# Patient Record
Sex: Male | Born: 1940
Health system: Southern US, Community
[De-identification: ages and names within clinical notes are randomized; demographics above are authoritative.]

## PROBLEM LIST (undated history)

## (undated) DIAGNOSIS — G3184 Mild cognitive impairment, so stated: Principal | ICD-10-CM

## (undated) DIAGNOSIS — G4733 Obstructive sleep apnea (adult) (pediatric): Secondary | ICD-10-CM

## (undated) DIAGNOSIS — H3530 Unspecified macular degeneration: Secondary | ICD-10-CM

## (undated) DIAGNOSIS — G4730 Sleep apnea, unspecified: Secondary | ICD-10-CM

## (undated) DIAGNOSIS — Z9989 Dependence on other enabling machines and devices: Principal | ICD-10-CM

## (undated) DIAGNOSIS — Z974 Presence of external hearing-aid: Secondary | ICD-10-CM

## (undated) DIAGNOSIS — E1110 Type 2 diabetes mellitus with ketoacidosis without coma: Secondary | ICD-10-CM

## (undated) DIAGNOSIS — H269 Unspecified cataract: Secondary | ICD-10-CM

## (undated) DIAGNOSIS — T4145XA Adverse effect of unspecified anesthetic, initial encounter: Secondary | ICD-10-CM

## (undated) DIAGNOSIS — Z9889 Other specified postprocedural states: Secondary | ICD-10-CM

## (undated) DIAGNOSIS — I6529 Occlusion and stenosis of unspecified carotid artery: Secondary | ICD-10-CM

## (undated) DIAGNOSIS — I10 Essential (primary) hypertension: Secondary | ICD-10-CM

## (undated) DIAGNOSIS — T8859XA Other complications of anesthesia, initial encounter: Secondary | ICD-10-CM

## (undated) DIAGNOSIS — E7800 Pure hypercholesterolemia, unspecified: Secondary | ICD-10-CM

## (undated) DIAGNOSIS — Z9641 Presence of insulin pump (external) (internal): Secondary | ICD-10-CM

## (undated) DIAGNOSIS — I351 Nonrheumatic aortic (valve) insufficiency: Secondary | ICD-10-CM

## (undated) DIAGNOSIS — H9190 Unspecified hearing loss, unspecified ear: Secondary | ICD-10-CM

## (undated) DIAGNOSIS — R112 Nausea with vomiting, unspecified: Secondary | ICD-10-CM

## (undated) HISTORY — DX: Mild cognitive impairment, so stated: G31.84

## (undated) HISTORY — DX: Sleep apnea, unspecified: G47.30

## (undated) HISTORY — DX: Occlusion and stenosis of unspecified carotid artery: I65.29

## (undated) HISTORY — DX: Unspecified cataract: H26.9

## (undated) HISTORY — DX: Nonrheumatic aortic (valve) insufficiency: I35.1

## (undated) HISTORY — DX: Unspecified hearing loss, unspecified ear: H91.90

## (undated) HISTORY — PX: SHOULDER ARTHROSCOPY: SHX128

## (undated) HISTORY — DX: Dependence on other enabling machines and devices: Z99.89

## (undated) HISTORY — DX: Obstructive sleep apnea (adult) (pediatric): G47.33

---
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
## 1998-05-02 ENCOUNTER — Inpatient Hospital Stay (HOSPITAL_COMMUNITY): Admission: EM | Admit: 1998-05-02 | Discharge: 1998-05-04 | Payer: Self-pay | Admitting: Internal Medicine

## 1998-05-03 ENCOUNTER — Encounter (HOSPITAL_BASED_OUTPATIENT_CLINIC_OR_DEPARTMENT_OTHER): Payer: Self-pay | Admitting: Internal Medicine

## 1999-07-07 ENCOUNTER — Encounter: Admission: RE | Admit: 1999-07-07 | Discharge: 1999-10-05 | Payer: Self-pay | Admitting: Internal Medicine

## 2001-09-08 ENCOUNTER — Encounter: Payer: Self-pay | Admitting: Emergency Medicine

## 2001-09-08 ENCOUNTER — Emergency Department (HOSPITAL_COMMUNITY): Admission: EM | Admit: 2001-09-08 | Discharge: 2001-09-08 | Payer: Self-pay | Admitting: Emergency Medicine

## 2002-04-18 ENCOUNTER — Emergency Department (HOSPITAL_COMMUNITY): Admission: EM | Admit: 2002-04-18 | Discharge: 2002-04-18 | Payer: Self-pay

## 2004-05-12 ENCOUNTER — Ambulatory Visit (HOSPITAL_COMMUNITY): Admission: RE | Admit: 2004-05-12 | Discharge: 2004-05-12 | Payer: Self-pay | Admitting: Orthopedic Surgery

## 2004-05-12 ENCOUNTER — Ambulatory Visit (HOSPITAL_BASED_OUTPATIENT_CLINIC_OR_DEPARTMENT_OTHER): Admission: RE | Admit: 2004-05-12 | Discharge: 2004-05-12 | Payer: Self-pay | Admitting: Orthopedic Surgery

## 2007-06-29 ENCOUNTER — Ambulatory Visit: Payer: Self-pay | Admitting: Internal Medicine

## 2007-08-09 ENCOUNTER — Encounter: Payer: Self-pay | Admitting: Internal Medicine

## 2007-08-09 ENCOUNTER — Ambulatory Visit: Payer: Self-pay | Admitting: Internal Medicine

## 2010-11-04 NOTE — Assessment & Plan Note (Signed)
[facility]                         GASTROENTEROLOGY OFFICE NOTE   NAME:Derrick [name]                 MRN:          [id number]  DATE:06/29/2007                            DOB:          [birth year]    REFERRING PHYSICIAN:  Geoffry Paradise, M.D.   REASON FOR CONSULTATION:  Surveillance colonoscopy.   HISTORY:  This is a pleasant, 70 year old white male, retired Arboriculturist, who presents today regarding surveillance  colonoscopy.  He has a history of hypertension, insulin-requiring  diabetes, for which he wears an insulin pump, and dyslipidemia.  He  initially underwent screening colonoscopy in October of 2003.  At that  time, he was found to have left-sided diverticulosis, as well as a  diminutive colon polyp.  This was destroyed with cautery, thus no  meaningful tissue available for submission.  Followup in five years  recommended.   For the most part, the patient has done well.  He has had right-shoulder  surgery in the interim.  No other medical problems.  From a GI  standpoint, also has been doing well.  He does, however, mention that he  has had a change in his bowel habits, which concerns him a little.  Specifically, he reports the entirety of his life having one regular  bowel movement first thing in the morning.  Now he has minimal to no  bowel movement in the morning, though will have a more regular bowel  movement in the afternoon, after lunch.  There is no bleeding, change in  stool caliber, or weight-loss.   PAST MEDICAL HISTORY:  As above.   PAST SURGICAL HISTORY:  Arthroscopic right shoulder surgery.   ALLERGIES:  No known drug allergies.   CURRENT MEDICATIONS:  1. Lisinopril/HCTZ 20/12.5 mg b.i.d.  2. Labetalol 200 mg b.i.d.  3. Aspirin 81 mg daily.  4. Multivitamin.  5. NovoLog insulin pump.  6. Crestor 10 mg daily.  7. Amlodipine 5 mg daily.   FAMILY HISTORY:  Negative for gastrointestinal  malignancy.  Father with  prostate cancer and heart disease.   SOCIAL HISTORY:  Patient is married with two children, lives with his  wife, has a Manufacturing engineer.  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  Does not smoke or use alcohol.   REVIEW OF SYSTEMS:  Per diagnostic evaluation form.   PHYSICAL EXAM:  Well-appearing male, in no acute distress.  Blood pressure 116/66, heart rate 72, weight is 202.6 pounds, he is 5  feet 8 inches in height.  HEENT:  Sclerae anicteric, conjunctivae pink, oral mucosa is intact, no  adenopathy.  LUNGS:  Clear.  HEART:  Regular.  ABDOMEN:  Soft, without tenderness, mass or hernia.  Insulin pump is in  place.  EXTREMITIES:  Without edema.   IMPRESSION:  1. Mild, nonworrisome change in bowel habits.  Recommend fiber      supplementation and reassurance.  2. History of diminutive colon polyp.  Due for surveillance      colonoscopy.  3. Multiple medical problems, including insulin-requiring diabetes.   RECOMMENDATIONS:  1. Fiber supplementation, as stated above.  2.  Schedule colonoscopy with polypectomy, if necessary.  The nature of      the procedure, as well as the risks, benefits, and alternatives,      have been reviewed.  He understood and agreed to proceed.  3. Hold insulin pump in the evening at bedtime prior to his early      morning colonoscopy to avoid hypoglycemia.  4. Ongoing general medical care with Dr. Jacky Kindle.     Wilhemina Bonito. Marina Goodell, MD  Electronically Signed    JNP/MedQ  DD: 06/29/2007  DT: 06/29/2007  Job #: [id number]

## 2010-11-07 NOTE — Op Note (Signed)
Derrick [name], Derrick [name]          ACCOUNT NO.:  [id number]   MEDICAL RECORD NO.:  [id number]          PATIENT TYPE:  AMB   LOCATION:  DSC                          FACILITY:  [facility]   PHYSICIAN:  Robert A. Thurston Hole, M.D. DATE OF BIRTH:  1941-04-10   DATE OF PROCEDURE:  05/12/2004  DATE OF DISCHARGE:                                 OPERATIVE REPORT   PREOPERATIVE DIAGNOSIS:  Right shoulder rotator cuff tendonitis with  adhesive capsulitis and impingement.   POSTOPERATIVE DIAGNOSIS:  1.  Right shoulder partial rotator cuff tear.  2.  Right shoulder adhesive capsulitis.  3.  Right shoulder impingement.   PROCEDURE:  1.  Right shoulder examination under anesthesia followed by manipulation.  2.  Right shoulder arthroscopic lysis of adhesions.  3.  Right shoulder partial rotator cuff tear debridement.  4.  Right shoulder subacromial decompression.   SURGEON:  Elana Alm. Thurston Hole, M.D.   ASSISTANT:  Julien Girt, P.A.   ANESTHESIA:  General.   OPERATIVE TIME:  45 minutes.   COMPLICATIONS:  None.   INDICATIONS FOR PROCEDURE:  Derrick [name] is a 70 year old diabetic who has  been having significant right shoulder pain for the past year increasing in  nature.  Exam and MRI has revealed rotator cuff tendonitis and impingement  with adhesive capsulitis and this has failed conservative care and he is now  to undergo arthroscopy.   DESCRIPTION OF PROCEDURE:  Derrick [name] was brought to the operating room on  May 12, 2004, after an interscalene block had been placed in the  holding room by anesthesia.  He was placed on the operating table in supine  position.  After being placed under general anesthesia, his right shoulder  was examined under anesthesia.  He had range of motion of forward flexion  150, adduction 140,  internal and external rotation of 50 degrees.  A gentle  manipulation was carried out breaking up adhesions and improving forward  flexion to 175, adduction to 170,  internal and external rotation of 85  degrees.  The shoulder remained stable to ligamentous exam.  He received  Ancef 1 gram IV preoperatively for prophylaxis.  He was then placed in a  beach chair position and his shoulder and arm were prepped using sterile  DuraPrep and draped using sterile technique.  Initially, the arthroscopy was  performed through a posterior arthroscopic portal, the arthroscope with a  pump attached was placed and through an anterior portal, an arthroscopic  probe was placed.  On initial inspection, hemarthrosis was noted secondary  to his manipulation and this was evacuated.  The articular cartilage in the  glenohumeral joint was found to be intact.  The anterior labrum, superior  labrum, and posterior labrum were all intact.  The anterior inferior  glenohumeral ligament complex was intact.  The synovium was injected and  hemorrhagic and erythematous throughout consistent with his adhesive  capsulitis and this was partially debrided and cauterized.  The rotator cuff  showed a partial tear of the supraspinatus, 25%, which was debrided.  The  rest of the rotator cuff was found to be intact.  The inferior capsular  recess showed erythematous and hemorrhagic synovitis which was debrided and  cauterized, otherwise, it was free of pathology.  The subacromial space was  entered and a lateral arthroscopic portal was made.  Moderately thickened  bursitis was resected.  The rotator cuff was inflamed and thickened on the  bursal side and this was partially debrided but a significant tear was not  found.  Impingement was noted and a subacromial decompression was carried  out removing 6 mm of the under surface of the anterior, anterior lateral,  and anterior medial acromion and CA ligament release carried out, as well.  The [employer] joint was not disturbed.  The shoulder could be brought through a  full range of motion with no impingement of the rotator cuff after this was  done.  At  this point, it was felt that all the pathology had been  satisfactorily addressed.  The instruments were removed.  The portals were  closed with 3-0 nylon sutures.  Sterile dressings and a sling were applied.  The patient was awakened and taken to the recovery room in stable condition.   FOLLOW UP CARE:  Derrick [name] will be followed as an outpatient with early  aggressive physical therapy.  He will be discharged on Percocet and  Naprosyn.  See him back in the office in a week for sutures out and follow  up.       RAW/MEDQ  D:  05/12/2004  T:  05/12/2004  Job:  [id number]

## 2011-06-30 DIAGNOSIS — H353290 Exudative age-related macular degeneration, unspecified eye, stage unspecified: Secondary | ICD-10-CM | POA: Diagnosis not present

## 2011-07-07 DIAGNOSIS — I10 Essential (primary) hypertension: Secondary | ICD-10-CM | POA: Diagnosis not present

## 2011-07-07 DIAGNOSIS — Z125 Encounter for screening for malignant neoplasm of prostate: Secondary | ICD-10-CM | POA: Diagnosis not present

## 2011-07-07 DIAGNOSIS — E119 Type 2 diabetes mellitus without complications: Secondary | ICD-10-CM | POA: Diagnosis not present

## 2011-07-07 DIAGNOSIS — E785 Hyperlipidemia, unspecified: Secondary | ICD-10-CM | POA: Diagnosis not present

## 2011-07-14 DIAGNOSIS — I10 Essential (primary) hypertension: Secondary | ICD-10-CM | POA: Diagnosis not present

## 2011-07-14 DIAGNOSIS — E119 Type 2 diabetes mellitus without complications: Secondary | ICD-10-CM | POA: Diagnosis not present

## 2011-07-14 DIAGNOSIS — E785 Hyperlipidemia, unspecified: Secondary | ICD-10-CM | POA: Diagnosis not present

## 2011-07-14 DIAGNOSIS — Z125 Encounter for screening for malignant neoplasm of prostate: Secondary | ICD-10-CM | POA: Diagnosis not present

## 2011-07-14 DIAGNOSIS — Z0000 Encounter for general adult medical examination without abnormal findings: Secondary | ICD-10-CM | POA: Diagnosis not present

## 2011-07-16 DIAGNOSIS — S43429A Sprain of unspecified rotator cuff capsule, initial encounter: Secondary | ICD-10-CM | POA: Diagnosis not present

## 2011-07-28 DIAGNOSIS — H353290 Exudative age-related macular degeneration, unspecified eye, stage unspecified: Secondary | ICD-10-CM | POA: Diagnosis not present

## 2011-07-28 DIAGNOSIS — H353190 Nonexudative age-related macular degeneration, unspecified eye, stage unspecified: Secondary | ICD-10-CM | POA: Diagnosis not present

## 2011-08-04 DIAGNOSIS — H353190 Nonexudative age-related macular degeneration, unspecified eye, stage unspecified: Secondary | ICD-10-CM | POA: Diagnosis not present

## 2011-08-04 DIAGNOSIS — H353290 Exudative age-related macular degeneration, unspecified eye, stage unspecified: Secondary | ICD-10-CM | POA: Diagnosis not present

## 2011-08-24 DIAGNOSIS — M75100 Unspecified rotator cuff tear or rupture of unspecified shoulder, not specified as traumatic: Secondary | ICD-10-CM | POA: Diagnosis not present

## 2011-08-24 DIAGNOSIS — M7500 Adhesive capsulitis of unspecified shoulder: Secondary | ICD-10-CM | POA: Diagnosis not present

## 2011-08-24 DIAGNOSIS — M67919 Unspecified disorder of synovium and tendon, unspecified shoulder: Secondary | ICD-10-CM | POA: Diagnosis not present

## 2011-08-24 DIAGNOSIS — M19019 Primary osteoarthritis, unspecified shoulder: Secondary | ICD-10-CM | POA: Diagnosis not present

## 2011-08-24 DIAGNOSIS — M25819 Other specified joint disorders, unspecified shoulder: Secondary | ICD-10-CM | POA: Diagnosis not present

## 2011-08-24 DIAGNOSIS — S43439A Superior glenoid labrum lesion of unspecified shoulder, initial encounter: Secondary | ICD-10-CM | POA: Diagnosis not present

## 2011-08-24 DIAGNOSIS — M719 Bursopathy, unspecified: Secondary | ICD-10-CM | POA: Diagnosis not present

## 2011-08-24 HISTORY — PX: SHOULDER ARTHROSCOPY: SHX128

## 2011-08-25 ENCOUNTER — Other Ambulatory Visit: Payer: Self-pay

## 2011-08-25 ENCOUNTER — Inpatient Hospital Stay (HOSPITAL_COMMUNITY)
Admission: EM | Admit: 2011-08-25 | Discharge: 2011-08-28 | DRG: 639 | Disposition: A | Discharge status: Home / Self Care | Payer: Medicare Other | Attending: Internal Medicine | Admitting: Internal Medicine

## 2011-08-25 ENCOUNTER — Encounter (HOSPITAL_COMMUNITY): Payer: Self-pay | Admitting: *Deleted

## 2011-08-25 DIAGNOSIS — I10 Essential (primary) hypertension: Secondary | ICD-10-CM | POA: Diagnosis not present

## 2011-08-25 DIAGNOSIS — E785 Hyperlipidemia, unspecified: Secondary | ICD-10-CM | POA: Diagnosis not present

## 2011-08-25 DIAGNOSIS — N39498 Other specified urinary incontinence: Secondary | ICD-10-CM | POA: Diagnosis not present

## 2011-08-25 DIAGNOSIS — Z87891 Personal history of nicotine dependence: Secondary | ICD-10-CM

## 2011-08-25 DIAGNOSIS — E1310 Other specified diabetes mellitus with ketoacidosis without coma: Principal | ICD-10-CM | POA: Diagnosis present

## 2011-08-25 DIAGNOSIS — E162 Hypoglycemia, unspecified: Secondary | ICD-10-CM | POA: Diagnosis not present

## 2011-08-25 DIAGNOSIS — Z9889 Other specified postprocedural states: Secondary | ICD-10-CM

## 2011-08-25 DIAGNOSIS — E860 Dehydration: Secondary | ICD-10-CM | POA: Diagnosis not present

## 2011-08-25 DIAGNOSIS — Z7982 Long term (current) use of aspirin: Secondary | ICD-10-CM

## 2011-08-25 DIAGNOSIS — I6350 Cerebral infarction due to unspecified occlusion or stenosis of unspecified cerebral artery: Secondary | ICD-10-CM | POA: Diagnosis not present

## 2011-08-25 DIAGNOSIS — Z9641 Presence of insulin pump (external) (internal): Secondary | ICD-10-CM

## 2011-08-25 DIAGNOSIS — R112 Nausea with vomiting, unspecified: Secondary | ICD-10-CM | POA: Diagnosis not present

## 2011-08-25 DIAGNOSIS — R4182 Altered mental status, unspecified: Secondary | ICD-10-CM | POA: Diagnosis not present

## 2011-08-25 DIAGNOSIS — E1110 Type 2 diabetes mellitus with ketoacidosis without coma: Secondary | ICD-10-CM

## 2011-08-25 DIAGNOSIS — I4891 Unspecified atrial fibrillation: Secondary | ICD-10-CM | POA: Diagnosis not present

## 2011-08-25 DIAGNOSIS — Z794 Long term (current) use of insulin: Secondary | ICD-10-CM | POA: Diagnosis not present

## 2011-08-25 HISTORY — DX: Pure hypercholesterolemia, unspecified: E78.00

## 2011-08-25 HISTORY — DX: Other complications of anesthesia, initial encounter: T88.59XA

## 2011-08-25 HISTORY — DX: Essential (primary) hypertension: I10

## 2011-08-25 HISTORY — DX: Obstructive sleep apnea (adult) (pediatric): G47.33

## 2011-08-25 HISTORY — DX: Adverse effect of unspecified anesthetic, initial encounter: T41.45XA

## 2011-08-25 HISTORY — DX: Unspecified macular degeneration: H35.30

## 2011-08-25 HISTORY — DX: Type 2 diabetes mellitus with ketoacidosis without coma: E11.10

## 2011-08-25 HISTORY — DX: Presence of insulin pump (external) (internal): Z96.41

## 2011-08-25 LAB — GLUCOSE, CAPILLARY
Glucose-Capillary: 579 mg/dL (ref 70–99)
Glucose-Capillary: 321 mg/dL — ABNORMAL HIGH (ref 70–99)

## 2011-08-25 LAB — BASIC METABOLIC PANEL
BUN: 30 mg/dL — ABNORMAL HIGH (ref 6–23)
Calcium: 9.1 mg/dL (ref 8.4–10.5)
GFR calc non Af Amer: 56 mL/min — ABNORMAL LOW (ref >90)
Glucose, Bld: 531 mg/dL — ABNORMAL HIGH (ref 70–99)

## 2011-08-25 LAB — URINE MICROSCOPIC-ADD ON

## 2011-08-25 LAB — URINALYSIS, ROUTINE W REFLEX MICROSCOPIC
Bilirubin Urine: NEGATIVE
Nitrite: NEGATIVE
Specific Gravity, Urine: 1.028 (ref 1.005–1.030)
pH: 5.5 (ref 5.0–8.0)

## 2011-08-25 LAB — DIFFERENTIAL
Basophils Absolute: 0.1 10*3/uL (ref 0.0–0.1)
Lymphocytes Relative: 7 % — ABNORMAL LOW (ref 12–46)
Monocytes Absolute: 1.2 10*3/uL — ABNORMAL HIGH (ref 0.1–1.0)
Monocytes Relative: 8 % (ref 3–12)
Neutro Abs: 12.4 10*3/uL — ABNORMAL HIGH (ref 1.7–7.7)

## 2011-08-25 LAB — POCT I-STAT, CHEM 8
BUN: 31 mg/dL — ABNORMAL HIGH (ref 6–23)
Chloride: 104 mEq/L (ref 96–112)
Creatinine, Ser: 1.3 mg/dL (ref 0.50–1.35)
Glucose, Bld: 565 mg/dL (ref 70–99)
Hemoglobin: 13.9 g/dL (ref 13.0–17.0)
Potassium: 4.7 mEq/L (ref 3.5–5.1)

## 2011-08-25 LAB — CBC
HCT: 39.6 % (ref 39.0–52.0)
Hemoglobin: 13.6 g/dL (ref 13.0–17.0)
RBC: 4.32 MIL/uL (ref 4.22–5.81)
RDW: 12.9 % (ref 11.5–15.5)
WBC: 14.6 10*3/uL — ABNORMAL HIGH (ref 4.0–10.5)

## 2011-08-25 LAB — POCT I-STAT 3, VENOUS BLOOD GAS (G3P V)
Bicarbonate: 15.5 mEq/L — ABNORMAL LOW (ref 20.0–24.0)
O2 Saturation: 99 %
TCO2: 17 mmol/L (ref 0–100)
pCO2, Ven: 36 mmHg — ABNORMAL LOW (ref 45.0–50.0)
pO2, Ven: 158 mmHg — ABNORMAL HIGH (ref 30.0–45.0)

## 2011-08-25 MED ORDER — ONDANSETRON HCL 4 MG/2ML IJ SOLN
4.0000 mg | Freq: Once | INTRAMUSCULAR | Status: AC
  Administered 2011-08-25: 4 mg via INTRAVENOUS

## 2011-08-25 MED ORDER — SODIUM CHLORIDE 0.9 % IV BOLUS (SEPSIS)
1000.0000 mL | Freq: Once | INTRAVENOUS | Status: AC
  Administered 2011-08-25: 1000 mL via INTRAVENOUS

## 2011-08-25 MED ORDER — DEXTROSE-NACL 5-0.45 % IV SOLN: INTRAVENOUS | Status: DC

## 2011-08-25 MED ORDER — POTASSIUM CHLORIDE 10 MEQ/100ML IV SOLN
10.0000 meq | INTRAVENOUS | Status: AC
  Administered 2011-08-25 – 2011-08-26 (×4): 10 meq via INTRAVENOUS
  Filled 2011-08-25 (×4): qty 100

## 2011-08-25 MED ORDER — ONDANSETRON HCL 4 MG/2ML IJ SOLN
INTRAMUSCULAR | Status: AC
  Administered 2011-08-25: 4 mg via INTRAVENOUS
  Filled 2011-08-25: qty 2

## 2011-08-25 MED ORDER — SODIUM CHLORIDE 0.9 % IV SOLN
INTRAVENOUS | Status: DC
  Administered 2011-08-25 – 2011-08-27 (×3): via INTRAVENOUS

## 2011-08-25 MED ORDER — HYDROCODONE-ACETAMINOPHEN 5-325 MG PO TABS
1.0000 | ORAL_TABLET | ORAL | Status: DC | PRN
  Administered 2011-08-27: 2 via ORAL
  Filled 2011-08-25: qty 2

## 2011-08-25 MED ORDER — SODIUM CHLORIDE 0.9 % IV SOLN
INTRAVENOUS | Status: DC
  Administered 2011-08-25: 15 mL/h via INTRAVENOUS

## 2011-08-25 MED ORDER — INSULIN ASPART 100 UNIT/ML ~~LOC~~ SOLN
15.0000 [IU] | Freq: Once | SUBCUTANEOUS | Status: AC
  Administered 2011-08-25: 15 [IU] via INTRAVENOUS
  Filled 2011-08-25: qty 15

## 2011-08-25 MED ORDER — SODIUM CHLORIDE 0.9 % IV SOLN
INTRAVENOUS | Status: DC
  Administered 2011-08-25: 22:00:00 via INTRAVENOUS
  Administered 2011-08-25: 4.9 [IU]/h via INTRAVENOUS
  Filled 2011-08-25 (×2): qty 1

## 2011-08-25 MED ORDER — SODIUM CHLORIDE 0.9 % IV SOLN: INTRAVENOUS | Status: DC

## 2011-08-25 MED ORDER — DEXTROSE 50 % IV SOLN: 25.0000 mL | INTRAVENOUS | Status: DC | PRN

## 2011-08-25 MED ORDER — ONDANSETRON HCL 4 MG PO TABS
4.0000 mg | ORAL_TABLET | Freq: Four times a day (QID) | ORAL | Status: DC | PRN
  Filled 2011-08-25: qty 1

## 2011-08-25 MED ORDER — SODIUM CHLORIDE 0.9 % IV SOLN
INTRAVENOUS | Status: AC
  Administered 2011-08-25: 19:00:00 via INTRAVENOUS

## 2011-08-25 MED ORDER — ALUM & MAG HYDROXIDE-SIMETH 200-200-20 MG/5ML PO SUSP: 30.0000 mL | Freq: Four times a day (QID) | ORAL | Status: DC | PRN

## 2011-08-25 MED ORDER — ONDANSETRON HCL 4 MG/2ML IJ SOLN: 4.0000 mg | Freq: Four times a day (QID) | INTRAMUSCULAR | Status: DC | PRN

## 2011-08-25 NOTE — Progress Notes (Signed)
Pt arrived to 4740 via stretcher from ED,  VSS, Insulin drip infusing at 17.3. Will carry out new orders.  Will cont to monitor.

## 2011-08-25 NOTE — ED Provider Notes (Signed)
I have personally seen and examined the patient.  I have discussed the plan of care with the resident.  I have reviewed the documentation on PMH/FH/Soc. History.  I have reviewed the documentation of the resident and agree.   Date: 08/25/2011  Rate: 102  Rhythm: sinus tachycardia  QRS Axis: left  Intervals: normal  ST/T Wave abnormalities: nonspecific ST changes  Conduction Disutrbances:none   Pt stabilized in the ED, insulin gtt started He is actively vomiting in the ED Dr Jacky Kindle here in ED to admit   Joya Gaskins, MD 08/25/11 [phone]

## 2011-08-25 NOTE — ED Provider Notes (Signed)
History     CSN: [id number]  Arrival date & time 08/25/11  1109   First MD Initiated Contact with Patient 08/25/11 1112      Chief Complaint  Patient presents with  . Hyperglycemia    snatched insulin pump tubing out and not getting insulin and now vomiting  . Shoulder Pain    post rotator cuff repair yesterday    (Consider location/radiation/quality/duration/timing/severity/associated sxs/prior treatment) Patient is a 71 y.o. male presenting with general illness. The history is provided by the patient.  Illness  The current episode started yesterday. The problem occurs rarely. The problem has been unchanged. The problem is moderate. The symptoms are relieved by nothing. The symptoms are aggravated by nothing. Associated symptoms include nausea and vomiting (vomited twice this morning). Pertinent negatives include no fever, no abdominal pain, no congestion, no rhinorrhea, no cough and no URI. Recently, medical care has been given by a specialist (had arthroscopic left shoulder surgery yesterday. ).    Past Medical History  Diagnosis Date  . Diabetes mellitus   . Hypertension   . Hypercholesteremia   . Macular degeneration   . Obstructive sleep apnea     Past Surgical History  Procedure Date  . Rotator cuff surgery yesterday 08/24/11    No family history on file.  History  Substance Use Topics  . Smoking status: Former Games developer  . Smokeless tobacco: Not on file  . Alcohol Use: No      Review of Systems  Constitutional: Negative for fever.  HENT: Negative for congestion and rhinorrhea.   Respiratory: Negative for cough.   Cardiovascular: Negative for chest pain.  Gastrointestinal: Positive for nausea and vomiting (vomited twice this morning). Negative for abdominal pain.  Genitourinary:       Polyuria, dry mouth   All other systems reviewed and are negative.    Allergies  Review of patient's allergies indicates no known allergies.  Home Medications  No  current outpatient prescriptions on file.  BP 132/52  Pulse 103  Temp(Src) 98.5 F (36.9 C) (Oral)  Resp 18  Ht 5\' 8"  (1.727 m)  Wt 191 lb (86.637 kg)  BMI 29.04 kg/m2  SpO2 98%  Physical Exam  Nursing note and vitals reviewed. Constitutional: He is oriented to person, place, and time. He appears well-developed and well-nourished. No distress.  HENT:  Head: Normocephalic and atraumatic.  Right Ear: External ear normal.  Left Ear: External ear normal.  Mouth/Throat: Oropharynx is clear and moist.  Eyes: Pupils are equal, round, and reactive to light.  Neck: Normal range of motion. Neck supple.  Cardiovascular: Regular rhythm and intact distal pulses.  Exam reveals no gallop and no friction rub.   Murmur (II/VI SEM) heard.      Tachycardic.   Pulmonary/Chest: Effort normal and breath sounds normal. No respiratory distress. He has no wheezes. He has no rales.  Abdominal: Soft. There is no tenderness. There is no rebound and no guarding.       Insulin pump noted at right side of abdomen.   Musculoskeletal: Normal range of motion. He exhibits tenderness. He exhibits no edema.       Left shoulder bandage in place. Steri strips noted with dried blood. No drainage, erythema or induration noted. TTP.   Lymphadenopathy:    He has no cervical adenopathy.  Neurological: He is alert and oriented to person, place, and time.  Skin: Skin is warm and dry. No rash noted. No erythema.  Psychiatric: He has a normal mood  and affect. His behavior is normal.    ED Course  Procedures (including critical care time)  Results for orders placed during the hospital encounter of 08/25/11  CBC      Component Value Range   WBC 14.6 (*) 4.0 - 10.5 (K/uL)   RBC 4.32  4.22 - 5.81 (MIL/uL)   Hemoglobin 13.6  13.0 - 17.0 (g/dL)   HCT 84.1  32.4 - 40.1 (%)   MCV 91.7  78.0 - 100.0 (fL)   MCH 31.5  26.0 - 34.0 (pg)   MCHC 34.3  30.0 - 36.0 (g/dL)   RDW 02.7  25.3 - 66.4 (%)   Platelets 191  150 - 400  (K/uL)  DIFFERENTIAL      Component Value Range   Neutrophils Relative 85 (*) 43 - 77 (%)   Neutro Abs 12.4 (*) 1.7 - 7.7 (K/uL)   Lymphocytes Relative 7 (*) 12 - 46 (%)   Lymphs Abs 1.0  0.7 - 4.0 (K/uL)   Monocytes Relative 8  3 - 12 (%)   Monocytes Absolute 1.2 (*) 0.1 - 1.0 (K/uL)   Eosinophils Relative 0  0 - 5 (%)   Eosinophils Absolute 0.0  0.0 - 0.7 (K/uL)   Basophils Relative 0  0 - 1 (%)   Basophils Absolute 0.1  0.0 - 0.1 (K/uL)  URINALYSIS, ROUTINE W REFLEX MICROSCOPIC      Component Value Range   Color, Urine YELLOW  YELLOW    APPearance CLEAR  CLEAR    Specific Gravity, Urine 1.028  1.005 - 1.030    pH 5.5  5.0 - 8.0    Glucose, UA >1000 (*) NEGATIVE (mg/dL)   Hgb urine dipstick NEGATIVE  NEGATIVE    Bilirubin Urine NEGATIVE  NEGATIVE    Ketones, ur >80 (*) NEGATIVE (mg/dL)   Protein, ur NEGATIVE  NEGATIVE (mg/dL)   Urobilinogen, UA 0.2  0.0 - 1.0 (mg/dL)   Nitrite NEGATIVE  NEGATIVE    Leukocytes, UA NEGATIVE  NEGATIVE   GLUCOSE, CAPILLARY      Component Value Range   Glucose-Capillary 579 (*) 70 - 99 (mg/dL)   Comment 1 Documented in Chart     Comment 2 Notify RN    URINE MICROSCOPIC-ADD ON      Component Value Range   Squamous Epithelial / LPF RARE  RARE    WBC, UA 0-2  <3 (WBC/hpf)   RBC / HPF 0-2  <3 (RBC/hpf)   Bacteria, UA RARE  RARE    Casts HYALINE CASTS (*) NEGATIVE   POCT I-STAT, CHEM 8      Component Value Range   Sodium 134 (*) 135 - 145 (mEq/L)   Potassium 4.7  3.5 - 5.1 (mEq/L)   Chloride 104  96 - 112 (mEq/L)   BUN 31 (*) 6 - 23 (mg/dL)   Creatinine, Ser 4.03  0.50 - 1.35 (mg/dL)   Glucose, Bld 474 (*) 70 - 99 (mg/dL)   Calcium, Ion 2.59 (*) 1.12 - 1.32 (mmol/L)   TCO2 18  0 - 100 (mmol/L)   Hemoglobin 13.9  13.0 - 17.0 (g/dL)   HCT 56.3  87.5 - 64.3 (%)   Comment NOTIFIED PHYSICIAN    POCT I-STAT 3, BLOOD GAS (G3P V)      Component Value Range   pH, Ven 7.243 (*) 7.250 - 7.300    pCO2, Ven 36.0 (*) 45.0 - 50.0 (mmHg)   pO2, Ven  158.0 (*) 30.0 - 45.0 (mmHg)   Bicarbonate  15.5 (*) 20.0 - 24.0 (mEq/L)   TCO2 17  0 - 100 (mmol/L)   O2 Saturation 99.0     Acid-base deficit 11.0 (*) 0.0 - 2.0 (mmol/L)   Sample type VENOUS    BASIC METABOLIC PANEL      Component Value Range   Sodium 134 (*) 135 - 145 (mEq/L)   Potassium 4.8  3.5 - 5.1 (mEq/L)   Chloride 92 (*) 96 - 112 (mEq/L)   CO2 14 (*) 19 - 32 (mEq/L)   Glucose, Bld 531 (*) 70 - 99 (mg/dL)   BUN 30 (*) 6 - 23 (mg/dL)   Creatinine, Ser 0.45  0.50 - 1.35 (mg/dL)   Calcium 9.1  8.4 - 40.9 (mg/dL)   GFR calc non Af Amer 56 (*) >90 (mL/min)   GFR calc Af Amer 65 (*) >90 (mL/min)  GLUCOSE, CAPILLARY      Component Value Range   Glucose-Capillary 522 (*) 70 - 99 (mg/dL)   Comment 1 Documented in Chart     Comment 2 Notify RN    GLUCOSE, CAPILLARY      Component Value Range   Glucose-Capillary 579 (*) 70 - 99 (mg/dL)   Comment 1 Documented in Chart     Comment 2 Notify RN         1. DKA (diabetic ketoacidoses)   2. Dehydration       MDM  16:65 AM 71 year old male with history of type 2 diabetes on insulin pump, hypertension and hyperlipidemia presenting with hyperglycemia that he states started yesterday. He noticed his sugars were in the 500s-600 range. He states this morning his insulin pump tubing acidentally became disconnected. He endorses dry mouth and polyuria since yesterday. He did have arthroscopic left shoulder surgery yesterday. He endorses 2 episodes of vomiting this morning, nonbloody and nonbilious. Patient is mildly tachycardic with a heart rate in the low 100s otherwise appears well. We'll check CBC, BMP, CBG, blood gas and fluid hydration.  Pt noted to have some vomiting. Labs show elevated gap metabolic acidosis c/w DKA. Dr. Jacky Kindle contact who has evaluated the patient in the ED. Placed on insulin drip and pt admitted to Dr. Jacky Kindle for further management.          Sheran Luz, MD 08/25/11 [phone]

## 2011-08-25 NOTE — ED Notes (Signed)
Pt had left shoulder rotator cuff repair yesterday and today accidentally snatch insulin pump tubing causing inability to get insulin.  Now sugar is critical high per ems machine, pt is vomiting due to uncontrolled pain.

## 2011-08-25 NOTE — H&P (Signed)
PCP:   Minda Meo, MD, MD   Chief Complaint:  Nausea and vomiting, altered mental status  HPI: Derrick [name] is a patient well-known to me, 71 year old, diabetes mellitus type 2 insulin-dependent on a pump was one-day status post left shoulder surgery. Leading up to this surgery we had given very specific pump instructions to the extent that he back is basals down and was supposed to given supplemental and swollen post op. Evidently emerge S. today after surgery with blood sugars in the 300s but has not had any supplemental insulin and sometime during the night his pump became disconnected from the insertion site. His wife contacted my nurse stating that he was unresponsive or delirious with blood sugars and axis of 500. EMS was summoned and he was brought to the emergency room where he was found to be vomiting, blood sugar in excess of 500, venous pH of 7.2 and smelling ketotic. At the time of my assessment he is awake alert a bit confused regarding circumstances but in good spirits. He has yet to receive any insulin at this time his blood sugar remains elevated. He is being aggressively hydrated. He's had no further vomiting since he received an injection of anti-medic. Pain involving the left shoulder is well-controlled and remains unclear however whether the Percocet treated more the nausea and vomiting whether this was more related to his metabolic derangements. Regardless, he is admitted at this time with either DKA or a nonketotic hyperosmolar state as lab work is still pending. He will be given IV fluids, IV insulin and further pending his course.  Review of Systems:  The patient denies anorexia, fever, weight loss,, vision loss, decreased hearing, hoarseness, chest pain, syncope, dyspnea on exertion, peripheral edema, balance deficits, hemoptysis, abdominal pain, melena, hematochezia, severe indigestion/heartburn, hematuria, incontinence, genital sores, muscle weakness, suspicious skin lesions,  transient blindness, difficulty walking, depression, unusual weight change, abnormal bleeding, enlarged lymph nodes, angioedema, and breast masses.  Past Medical History: Past Medical History  Diagnosis Date  . Diabetes mellitus   . Hypertension   . Hypercholesteremia   . Macular degeneration   . Obstructive sleep apnea    Past Surgical History  Procedure Date  . Rotator cuff surgery yesterday 08/24/11    Medications: Prior to Admission medications   Not on File    Allergies:  No Known Allergies  Social History:  reports that he has quit smoking. He does not have any smokeless tobacco history on file. He reports that he does not drink alcohol or use illicit drugs.  Family History: No family history on file.  Physical Exam: Filed Vitals:   08/25/11 1111 08/25/11 1308  BP: 132/52 128/58  Pulse: 103 101  Temp: 98.5 F (36.9 C)   TempSrc: Oral   Resp: 18 18  Height: 5\' 8"  (1.727 m)   Weight: 86.637 kg (191 lb)   SpO2: 98% 96%   General appearance: alert and cooperative Head: Normocephalic, without obvious abnormality, atraumatic Eyes: conjunctivae/corneas clear. PERRL, EOM's intact.  Nose: Nares normal. Septum midline. Mucosa normal. No drainage or sinus tenderness. Throat: lips, mucosa, and tongue normal; teeth and gums normal Neck: no adenopathy, no carotid bruit, no JVD and thyroid not enlarged, symmetric, no tenderness/mass/nodules Resp: clear to auscultation bilaterally Cardio: regular rate and rhythm, S1, S2 normal, no murmur, click, rub or gallop GI: soft, non-tender; bowel sounds normal; no masses,  no organomegaly Extremities: extremities normal, atraumatic, no cyanosis or edema Pulses: 2+ and symmetric Lymph nodes: Cervical adenopathy: no cervical lymphadenopathy Neurologic:  Alert and oriented X 3, normal strength and tone. Normal symmetric reflexes.     Labs on Admission:   [facility] 08/25/11 1223  NA 134*  K 4.7  CL 104  CO2 --  GLUCOSE 565*    BUN 31*  CREATININE 1.30  CALCIUM --  MG --  PHOS --   No results found for this basename: AST:2,ALT:2,ALKPHOS:2,BILITOT:2,PROT:2,ALBUMIN:2 in the last 72 hours No results found for this basename: LIPASE:2,AMYLASE:2 in the last 72 hours  Basename 08/25/11 1223 08/25/11 1204  WBC -- 14.6*  NEUTROABS -- 12.4*  HGB 13.9 13.6  HCT 41.0 39.6  MCV -- 91.7  PLT -- 191   No results found for this basename: CKTOTAL:3,CKMB:3,CKMBINDEX:3,TROPONINI:3 in the last 72 hours No results found for this basename: TSH,T4TOTAL,FREET3,T3FREE,THYROIDAB in the last 72 hours No results found for this basename: VITAMINB12:2,FOLATE:2,FERRITIN:2,TIBC:2,IRON:2,RETICCTPCT:2 in the last 72 hours  Radiological Exams on Admission: No results found. Orders placed during the hospital encounter of 08/25/11  . ED EKG  . ED EKG    Assessment/Plan  #1 DKA versus nonketotic hyperosmolar state lab work still pending interventions will remain the same to include IV fluids and IV insulin  #2 essential hypertension stable  #3 hyperlipidemia stable  #4 postop day #1 left shoulder arthroscopy/rotator cuff repair by Dr. Alda Berthold  Marine Lezotte A 08/25/2011, 1:15 PM

## 2011-08-25 NOTE — ED Provider Notes (Signed)
Pt seen with resident He is here for intractable vomiting, likely DKA Seen by dr Jacky Kindle and will admit   Joya Gaskins, MD 08/25/11 [phone]

## 2011-08-25 NOTE — ED Notes (Signed)
Pt vomited, soiling bed and gown.  Pt cleaned, was able to sit in chair with bed cleaned and linens changed.  Top gauze changed on L shoulder dressing due to being soiled as well, pt tolerated well.  Bolus continues, pt is alert and oriented, conversing with family at bedside and in no acute distress.

## 2011-08-26 LAB — CBC
HCT: 36.2 % — ABNORMAL LOW (ref 39.0–52.0)
MCHC: 35.4 g/dL (ref 30.0–36.0)
MCV: 88.1 fL (ref 78.0–100.0)
RDW: 13 % (ref 11.5–15.5)

## 2011-08-26 LAB — BASIC METABOLIC PANEL
BUN: 28 mg/dL — ABNORMAL HIGH (ref 6–23)
CO2: 21 mEq/L (ref 19–32)
Chloride: 109 mEq/L (ref 96–112)
Creatinine, Ser: 1.08 mg/dL (ref 0.50–1.35)
Glucose, Bld: 62 mg/dL — ABNORMAL LOW (ref 70–99)

## 2011-08-26 LAB — GLUCOSE, CAPILLARY
Glucose-Capillary: 214 mg/dL — ABNORMAL HIGH (ref 70–99)
Glucose-Capillary: 59 mg/dL — ABNORMAL LOW (ref 70–99)
Glucose-Capillary: 121 mg/dL — ABNORMAL HIGH (ref 70–99)
Glucose-Capillary: 202 mg/dL — ABNORMAL HIGH (ref 70–99)
Glucose-Capillary: 271 mg/dL — ABNORMAL HIGH (ref 70–99)

## 2011-08-26 MED ORDER — INSULIN ASPART 100 UNIT/ML ~~LOC~~ SOLN
0.0000 [IU] | Freq: Three times a day (TID) | SUBCUTANEOUS | Status: DC
  Administered 2011-08-26: 5 [IU] via SUBCUTANEOUS
  Administered 2011-08-26: 8 [IU] via SUBCUTANEOUS
  Administered 2011-08-27 (×2): 5 [IU] via SUBCUTANEOUS
  Administered 2011-08-27: 8 [IU] via SUBCUTANEOUS
  Administered 2011-08-28: 11 [IU] via SUBCUTANEOUS
  Administered 2011-08-28: 5 [IU] via SUBCUTANEOUS
  Filled 2011-08-26 (×3): qty 3

## 2011-08-26 MED ORDER — INSULIN GLARGINE 100 UNIT/ML ~~LOC~~ SOLN
15.0000 [IU] | Freq: Every day | SUBCUTANEOUS | Status: DC
  Administered 2011-08-26: 15 [IU] via SUBCUTANEOUS
  Filled 2011-08-26: qty 3

## 2011-08-26 MED ORDER — AMLODIPINE BESYLATE 5 MG PO TABS
5.0000 mg | ORAL_TABLET | Freq: Every day | ORAL | Status: DC
  Administered 2011-08-26 – 2011-08-28 (×3): 5 mg via ORAL
  Filled 2011-08-26 (×3): qty 1

## 2011-08-26 MED ORDER — LISINOPRIL 20 MG PO TABS
20.0000 mg | ORAL_TABLET | Freq: Two times a day (BID) | ORAL | Status: DC
  Administered 2011-08-26 – 2011-08-28 (×5): 20 mg via ORAL
  Filled 2011-08-26 (×6): qty 1

## 2011-08-26 NOTE — Progress Notes (Signed)
Pt converted from Afib to Sinus Rhythm about 0100.

## 2011-08-26 NOTE — Progress Notes (Signed)
Subjective: Derrick [name] is awake and alert looks much better. Data one episode of vomiting early morning and blood sugars have stabilized nicely. Serum CO2 has normalized as well. Pain remains involving the left shoulder. He certainly not ready transition back to the pump by his regards in terms of his ability to manage the pump.  Objective: Vital signs in last 24 hours: Temp:  [97.8 F (36.6 C)-98.6 F (37 C)] 97.8 F (36.6 C) (03/06 0603) Pulse Rate:  [90-103] 95  (03/06 0603) Resp:  [16-20] 20  (03/06 0603) BP: (96-151)/(47-80) 151/59 mmHg (03/06 0603) SpO2:  [94 %-98 %] 95 % (03/06 0603) Weight:  [86.637 kg (191 lb)-88.5 kg (195 lb 1.7 oz)] 88.5 kg (195 lb 1.7 oz) (03/06 0603) Weight change:   CBG (last 3)   Basename 08/26/11 0616 08/26/11 0507 08/26/11 0356  GLUCAP 121* 59* 86    Intake/Output from previous day: 03/05 0701 - 03/06 0700 In: -  Out: 751 [Urine:600; Emesis/NG output:150; Stool:1]  Physical Exam: Patient is awake alert bright interactive no distress. Good facial symmetry. No JVD or bruits. Lungs are clear. Cardiovascular exam regular rate and rhythm no murmur. Abdomen is soft and nontender bowel sounds normal. Extremities intact distal pulses no edema. Left shoulder is dressed. Neurologically he is nonlateralizing.   Lab Results:  [facility] 08/26/11 0512 08/25/11 1233  NA 139 134*  K 3.7 4.8  CL 109 92*  CO2 21 14*  GLUCOSE 62* 531*  BUN 28* 30*  CREATININE 1.08 1.28  CALCIUM 8.4 9.1  MG -- --  PHOS -- --   No results found for this basename: AST:2,ALT:2,ALKPHOS:2,BILITOT:2,PROT:2,ALBUMIN:2 in the last 72 hours  Basename 08/26/11 0512 08/25/11 1223 08/25/11 1204  WBC 16.1* -- 14.6*  NEUTROABS -- -- 12.4*  HGB 12.8* 13.9 --  HCT 36.2* 41.0 --  MCV 88.1 -- 91.7  PLT 222 -- 191   No results found for this basename: INR, PROTIME   No results found for this basename: CKTOTAL:3,CKMB:3,CKMBINDEX:3,TROPONINI:3 in the last 72 hours No results found for this  basename: TSH,T4TOTAL,FREET3,T3FREE,THYROIDAB in the last 72 hours No results found for this basename: VITAMINB12:2,FOLATE:2,FERRITIN:2,TIBC:2,IRON:2,RETICCTPCT:2 in the last 72 hours  Studies/Results: No results found.   Assessment/Plan: #1 diabetic ketoacidosis resolved will transition back to Lantus and sliding scale insulin  #2 essential hypertension stable  #3 hyperlipidemia stable  #4 postop day #2 left rotator cuff [place] or so notified of admission yesterday   LOS: 1 day   Shalaya Swailes A 08/26/2011, 7:27 AM

## 2011-08-27 ENCOUNTER — Inpatient Hospital Stay (HOSPITAL_COMMUNITY): Payer: Medicare Other

## 2011-08-27 LAB — BASIC METABOLIC PANEL
CO2: 16 mEq/L — ABNORMAL LOW (ref 19–32)
Calcium: 8.2 mg/dL — ABNORMAL LOW (ref 8.4–10.5)
Creatinine, Ser: 0.9 mg/dL (ref 0.50–1.35)
GFR calc non Af Amer: 85 mL/min — ABNORMAL LOW (ref >90)

## 2011-08-27 LAB — BLOOD GAS, ARTERIAL
Acid-base deficit: 4.3 mmol/L — ABNORMAL HIGH (ref 0.0–2.0)
Drawn by: 277341
O2 Saturation: 97.9 %
Patient temperature: 98.6
pO2, Arterial: 84.3 mmHg (ref 80.0–100.0)

## 2011-08-27 LAB — GLUCOSE, CAPILLARY
Glucose-Capillary: 257 mg/dL — ABNORMAL HIGH (ref 70–99)
Glucose-Capillary: 313 mg/dL — ABNORMAL HIGH (ref 70–99)
Glucose-Capillary: 320 mg/dL — ABNORMAL HIGH (ref 70–99)
Glucose-Capillary: 278 mg/dL — ABNORMAL HIGH (ref 70–99)
Glucose-Capillary: 235 mg/dL — ABNORMAL HIGH (ref 70–99)

## 2011-08-27 LAB — CBC
MCH: 31.5 pg (ref 26.0–34.0)
MCV: 89.1 fL (ref 78.0–100.0)
Platelets: 186 10*3/uL (ref 150–400)
RDW: 13.4 % (ref 11.5–15.5)
WBC: 10.4 10*3/uL (ref 4.0–10.5)

## 2011-08-27 MED ORDER — INSULIN ASPART 100 UNIT/ML ~~LOC~~ SOLN
10.0000 [IU] | Freq: Once | SUBCUTANEOUS | Status: AC
  Administered 2011-08-27: 10 [IU] via SUBCUTANEOUS

## 2011-08-27 MED ORDER — INSULIN GLARGINE 100 UNIT/ML ~~LOC~~ SOLN
25.0000 [IU] | Freq: Every day | SUBCUTANEOUS | Status: DC
  Administered 2011-08-27 – 2011-08-28 (×2): 25 [IU] via SUBCUTANEOUS

## 2011-08-27 NOTE — Progress Notes (Addendum)
At 0516 CBG 320. Gave 10 units of Novolog insulin as ordered. Rechecked Blood sugar. CBG is now 278. Will continue to monitor

## 2011-08-27 NOTE — Progress Notes (Signed)
Met with pt and wife, pt son also in the room to discuss possible HH needs. HHRN to continue diabetes education and disease management may be only need. Pt confusion clearing per wife and son. Daughter not in room, list of [facility] providers provided and wife with discuss with daughter.  Will follow up with pt and wife tomorrow, 08/28/2011 Johny Shock RN MPH Case Manager [phone]

## 2011-08-27 NOTE — Progress Notes (Signed)
Inpatient Diabetes Program Recommendations  AACE/ADA: New Consensus Statement on Inpatient Glycemic Control (2009)  Target Ranges:  Prepandial:   less than 140 mg/dL      Peak postprandial:   less than 180 mg/dL (1-2 hours)      Critically ill patients:  140 - 180 mg/dL    Inpatient Diabetes Program Recommendations Correction (SSI): consider decreasing to sensitive if meal coverage is added Insulin - Meal Coverage: Consider adding meal coverage per pump settings

## 2011-08-27 NOTE — Progress Notes (Signed)
Pt. Converted from Afib to Sinus Tach at [facility]

## 2011-08-27 NOTE — Progress Notes (Signed)
Subjective: Patient is awake and alert but has had some difficulty with confusion through the night certainly confused regarding some circumstances this morning. He is appropriate and calm and following commands. Rhythm has remained sinus/sinus tach. Blood sugars have been up but it we have underestimated his basal needs and upon discussions with the family where clearly not going to embark upon resuming pump therapy at this point and will be more aggressive with basal Lantus and sliding scale insulin can further sort out his morning chemistries.  Objective: Vital signs in last 24 hours: Temp:  [98.1 F (36.7 C)-99.1 F (37.3 C)] 98.4 F (36.9 C) (03/07 0627) Pulse Rate:  [106-115] 109  (03/07 0627) Resp:  [20] 20  (03/07 0627) BP: (153-172)/(67-82) 172/82 mmHg (03/07 0627) SpO2:  [98 %-99 %] 99 % (03/07 0627) Weight:  [88.6 kg (195 lb 5.2 oz)] 88.6 kg (195 lb 5.2 oz) (03/07 0627) Weight change: 1.963 kg (4 lb 5.2 oz)  CBG (last 3)   Basename 08/27/11 0610 08/27/11 0516 08/27/11 0420  GLUCAP 278* 320* 313*    Intake/Output from previous day: 03/06 0701 - 03/07 0700 In: 2576.3 [P.O.:780; I.V.:1796.3] Out: 3481 [Urine:3480; Stool:1]  Physical Exam: Patient is awake alert conversant and nonlateralizing. Lungs are clear. Cardiovascular exam regular rate and rhythm. No edema and no JVD. Good facial symmetry.   Lab Results:  Basename 08/27/11 0516 08/26/11 0512  NA 139 139  K 3.7 3.7  CL 106 109  CO2 16* 21  GLUCOSE 318* 62*  BUN 15 28*  CREATININE 0.90 1.08  CALCIUM 8.2* 8.4  MG -- --  PHOS -- --   No results found for this basename: AST:2,ALT:2,ALKPHOS:2,BILITOT:2,PROT:2,ALBUMIN:2 in the last 72 hours  Basename 08/27/11 0516 08/26/11 0512 08/25/11 1204  WBC 10.4 16.1* --  NEUTROABS -- -- 12.4*  HGB 12.4* 12.8* --  HCT 35.1* 36.2* --  MCV 89.1 88.1 --  PLT 186 222 --   No results found for this basename: INR, PROTIME   No results found for this basename:  CKTOTAL:3,CKMB:3,CKMBINDEX:3,TROPONINI:3 in the last 72 hours No results found for this basename: TSH,T4TOTAL,FREET3,T3FREE,THYROIDAB in the last 72 hours No results found for this basename: VITAMINB12:2,FOLATE:2,FERRITIN:2,TIBC:2,IRON:2,RETICCTPCT:2 in the last 72 hours  Studies/Results: No results found.   Assessment/Plan: #1 diabetic ketoacidosis seemingly clinically resolved as believe that the elevated blood sugars are more a product of underestimating his Lantus needs however we did note that his CO2 is down again and we will check a blood gas to determine whether we continue on the Lantus sliding scale root or resume insulin infusion  #2 altered mental status believed to be multifactorial to include anesthesia, possible baseline memory loss of decompensation as a result of all the events, metabolic derangements but we'll check an MRI to rule out a small embolic stroke given his A. fib transient rhythm during postop course  #3 essential hypertension stable at this time  Plan stat MRI brain and blood gas to further decisions pending this.   LOS: 2 days   Wei Poplaski A 08/27/2011, 7:58 AM

## 2011-08-27 NOTE — Progress Notes (Signed)
Throughout the night patient Q4 CBG values has been increasing. Patient has not had anything to eat or drink since dinner time. Last CBG at 0420 was 313.  Avva, MD notified of increasing values. Orders given to give 10 units of novolog insulin. Will continue to monitor

## 2011-08-28 LAB — GLUCOSE, CAPILLARY
Glucose-Capillary: 205 mg/dL — ABNORMAL HIGH (ref 70–99)
Glucose-Capillary: 212 mg/dL — ABNORMAL HIGH (ref 70–99)
Glucose-Capillary: 233 mg/dL — ABNORMAL HIGH (ref 70–99)

## 2011-08-28 LAB — BASIC METABOLIC PANEL
Chloride: 107 mEq/L (ref 96–112)
GFR calc non Af Amer: >90 mL/min (ref >90)
Glucose, Bld: 231 mg/dL — ABNORMAL HIGH (ref 70–99)
Potassium: 2.9 mEq/L — ABNORMAL LOW (ref 3.5–5.1)
Sodium: 142 mEq/L (ref 135–145)

## 2011-08-28 LAB — CBC
HCT: 35.4 % — ABNORMAL LOW (ref 39.0–52.0)
Hemoglobin: 12.5 g/dL — ABNORMAL LOW (ref 13.0–17.0)
MCH: 30.9 pg (ref 26.0–34.0)
RBC: 4.04 MIL/uL — ABNORMAL LOW (ref 4.22–5.81)

## 2011-08-28 MED ORDER — INSULIN GLARGINE 100 UNIT/ML ~~LOC~~ SOLN: 25.0000 [IU] | Freq: Every day | SUBCUTANEOUS | Status: DC

## 2011-08-28 MED ORDER — POTASSIUM CHLORIDE CRYS ER 20 MEQ PO TBCR
20.0000 meq | EXTENDED_RELEASE_TABLET | Freq: Once | ORAL | Status: AC
  Administered 2011-08-28: 20 meq via ORAL
  Filled 2011-08-28: qty 1

## 2011-08-28 MED ORDER — POTASSIUM CHLORIDE 10 MEQ/100ML IV SOLN
10.0000 meq | INTRAVENOUS | Status: DC
  Filled 2011-08-28 (×2): qty 100

## 2011-08-28 MED ORDER — INSULIN ASPART 100 UNIT/ML ~~LOC~~ SOLN: 0.0000 [IU] | Freq: Three times a day (TID) | SUBCUTANEOUS | Status: DC

## 2011-08-28 NOTE — Discharge Summary (Signed)
DISCHARGE SUMMARY  Derrick [name]  MR#: [id number]  DOB:Jan 22, 1941  Date of Admission: 08/25/2011 Date of Discharge: 08/28/2011  Attending Physician:Kazi Montoro A  Patient's WUJ:WJXBJYN,WGNFAOZ A, MD, MD  Consults:Treatment Team:  Minda Meo, MD  Discharge Diagnoses:  #1 diabetic ketoacidosis precipitated by discontinued pump and postop state  #2 altered mental status secondary to medications and mild metabolic arrangements resolved  #3 essential hypertension  #4 hyperlipidemia  #5 post op left shoulder rotator cuff surgery  Discharge Medications: Medication List  As of 08/28/2011 12:12 PM   STOP taking these medications         methocarbamol 500 MG tablet      oxyCODONE-acetaminophen 5-325 MG per tablet         TAKE these medications         amLODipine 10 MG tablet   Commonly known as: NORVASC   Take 10 mg by mouth daily.      aspirin EC 81 MG tablet   Take 81 mg by mouth daily.      ICAPS AREDS FORMULA PO   Take 1 tablet by mouth 2 (two) times daily.      insulin aspart 100 UNIT/ML injection   Commonly known as: novoLOG   Inject 0-15 Units into the skin 3 (three) times daily with meals. Check blood sugars every 4 hours while awake and use the Humalog pen: Greater than 350 give 12 units, 251-350 give 8 units, 151-250 give 4 units.      insulin glargine 100 UNIT/ML injection   Commonly known as: LANTUS   Inject 25 Units into the skin daily.      labetalol 100 MG tablet   Commonly known as: NORMODYNE   Take 100 mg by mouth daily.      lisinopril-hydrochlorothiazide 20-12.5 MG per tablet   Commonly known as: PRINZIDE,ZESTORETIC   Take 1 tablet by mouth daily.      rosuvastatin 20 MG tablet   Commonly known as: CRESTOR   Take 20 mg by mouth daily.            Hospital Procedures: Mr Sherrin Daisy Contrast  08/27/2011  *RADIOLOGY REPORT*  Clinical Data: Altered mental status post hot.  Atrial fibrillation.  Diabetes, high blood pressure and  hypercholesterolemia.  MRI HEAD WITHOUT CONTRAST  Technique:  Multiplanar, multiecho pulse sequences of the brain and surrounding structures were obtained according to standard protocol without intravenous contrast.  Comparison: None.  Findings: Motion degraded exam.  No acute infarct. Remote small thalamic infarcts and left basal ganglia infarct.  No intracranial hemorrhage.  Moderate white matter type changes felt to be related to result of small vessel disease.  Mild global atrophy without hydrocephalus.  No intracranial mass lesion detected on this unenhanced exam.  Transverse ligament hypertrophy.  Major intracranial vascular structures are patent.  IMPRESSION: No acute infarct.  Remote small thalamic infarcts and left basal ganglia infarct.  Moderate small vessel disease type changes.  Mild global atrophy without hydrocephalus.  Original Report Authenticated By: Fuller Canada, M.D.    History of Present Illness:  Derrick [name] is a patient well-known to me, 71 year old, diabetes mellitus type 2 insulin-dependent on a pump was one-day status post left shoulder surgery. Leading up to this surgery we had given very specific pump instructions to the extent that he back is basals down and was supposed to given supplemental and swollen post op. Evidently emerge S. today after surgery with blood sugars in the 300s but has not had any supplemental  insulin and sometime during the night his pump became disconnected from the insertion site. His wife contacted my nurse stating that he was unresponsive or delirious with blood sugars and axis of 500. EMS was summoned and he was brought to the emergency room where he was found to be vomiting, blood sugar in excess of 500, venous pH of 7.2 and smelling ketotic. At the time of my assessment he is awake alert a bit confused regarding circumstances but in good spirits. He has yet to receive any insulin at this time his blood sugar remains elevated. He is being aggressively  hydrated. He's had no further vomiting since he received an injection of anti-medic. Pain involving the left shoulder is well-controlled and remains unclear however whether the Percocet treated more the nausea and vomiting whether this was more related to his metabolic derangements. Regardless, he is admitted at this time with either DKA or a nonketotic hyperosmolar state as lab work is still pending. He will be given IV fluids, IV insulin and further pending his course.  Hospital Course: Derrick [name] was admitted to the telemetry inpatient unit and maintained on a glucose stabilizer protocol with insulin drip and hourly blood sugars. He was subsequently converted to a regimen of Lantus and sliding scale NovoLog as it was not felt that he was capable of managing his pump. He was aggressively hydrated and ultimately potassium replaced orally with further planned as an outpatient. Mood confusion regarding circumstances but certainly cooperative and no evidence of agitation. Repeat blood gas confirmed good stability with no evidence of continued acidosis. MRI performed to rule out an acute CNS event and this revealed no evidence of an acute event but only old subcortical infarcts. This be further addressed with risk management as an outpatient. On the day of discharge she is mentally clear, eating well, pain is well-controlled and neurologically quite stable. Potassium is been replaced orally to be followed up as an outpatient. Spent a great deal of time with wife and daughter at the bedside revealing the Lantus insulin and NovoLog planned until he is capable of managing his pump within the next 2-3 days. I've arranged for them to pick up Lantus pens and Humalog pens at my office on the way home as this will temporize until he get back on his pump.  Day of Discharge Exam BP 166/81  Pulse 98  Temp(Src) 97.6 F (36.4 C) (Oral)  Resp 18  Ht 5\' 8"  (1.727 m)  Wt 85.5 kg (188 lb 7.9 oz)  BMI 28.66 kg/m2  SpO2  96%  Physical Exam: General appearance: alert, cooperative and no distress Eyes: no scleral icterus Throat: oropharynx moist without erythema Resp: clear to auscultation bilaterally Cardio: regular rate and rhythm, S1, S2 normal, no murmur, click, rub or gallop Extremities: no clubbing, cyanosis or edema Abdominal exam is soft nontender good bowel sounds. Neurologically higher cortical functioning is intact he is nonlateralizing completely normal.  Discharge Labs:  [facility] 08/28/11 0520 08/27/11 0516  NA 142 139  K 2.9* 3.7  CL 107 106  CO2 25 16*  GLUCOSE 231* 318*  BUN 9 15  CREATININE 0.77 0.90  CALCIUM 8.2* 8.2*  MG -- --  PHOS -- --   No results found for this basename: AST:2,ALT:2,ALKPHOS:2,BILITOT:2,PROT:2,ALBUMIN:2 in the last 72 hours  Basename 08/28/11 0520 08/27/11 0516  WBC 6.1 10.4  NEUTROABS -- --  HGB 12.5* 12.4*  HCT 35.4* 35.1*  MCV 87.6 89.1  PLT 176 186   No results found for this  basename: CKTOTAL:3,CKMB:3,CKMBINDEX:3,TROPONINI:3 in the last 72 hours No results found for this basename: TSH,T4TOTAL,FREET3,T3FREE,THYROIDAB in the last 72 hours No results found for this basename: VITAMINB12:2,FOLATE:2,FERRITIN:2,TIBC:2,IRON:2,RETICCTPCT:2 in the last 72 hours  Discharge instructions: Discharge Orders    Future Orders Please Complete By Expires   Diet - low sodium heart healthy      Increase activity slowly         Disposition: Home with family  Follow-up Appts: Follow-up with Dr. Jacky Kindle at [facility] in 1 week.  Call for appointment.  Condition on Discharge: Improved  Tests Needing Follow-up: Will recheck potassium early next week  Signed: Vearl Allbaugh A 08/28/2011, 12:12 PM

## 2011-08-28 NOTE — Progress Notes (Signed)
Pt K 2.9 this am.  MD has been made aware.  New orders given.  Will continue to monitor. Nino Glow RN

## 2011-08-28 NOTE — Progress Notes (Signed)
Pt confusion ;has cleared and Dr Bryn Gulling has discussed needs with pt and family, no HH needs at this time.  Johny Shock RN MPH Case Manager [phone]

## 2011-08-28 NOTE — Progress Notes (Signed)
IV d/c'd.  Tele d/c'd.  Pt d/c'd to home.  Insulin pen use demonstrated to pt and pt wife.  Both performed return demo.  Both deny any questions or concerns at this time.  Pt leaving unit via wheelchair and appears in no acute distress. Nino Glow RN

## 2011-08-31 DIAGNOSIS — S43429A Sprain of unspecified rotator cuff capsule, initial encounter: Secondary | ICD-10-CM | POA: Diagnosis not present

## 2011-09-03 DIAGNOSIS — S43429A Sprain of unspecified rotator cuff capsule, initial encounter: Secondary | ICD-10-CM | POA: Diagnosis not present

## 2011-09-07 DIAGNOSIS — S43429A Sprain of unspecified rotator cuff capsule, initial encounter: Secondary | ICD-10-CM | POA: Diagnosis not present

## 2011-09-08 DIAGNOSIS — H353290 Exudative age-related macular degeneration, unspecified eye, stage unspecified: Secondary | ICD-10-CM | POA: Diagnosis not present

## 2011-09-10 DIAGNOSIS — S43429A Sprain of unspecified rotator cuff capsule, initial encounter: Secondary | ICD-10-CM | POA: Diagnosis not present

## 2011-09-14 DIAGNOSIS — S43429A Sprain of unspecified rotator cuff capsule, initial encounter: Secondary | ICD-10-CM | POA: Diagnosis not present

## 2011-09-15 DIAGNOSIS — H353290 Exudative age-related macular degeneration, unspecified eye, stage unspecified: Secondary | ICD-10-CM | POA: Diagnosis not present

## 2011-09-17 DIAGNOSIS — S43429A Sprain of unspecified rotator cuff capsule, initial encounter: Secondary | ICD-10-CM | POA: Diagnosis not present

## 2011-09-22 DIAGNOSIS — S43429A Sprain of unspecified rotator cuff capsule, initial encounter: Secondary | ICD-10-CM | POA: Diagnosis not present

## 2011-09-25 DIAGNOSIS — S43429A Sprain of unspecified rotator cuff capsule, initial encounter: Secondary | ICD-10-CM | POA: Diagnosis not present

## 2011-10-01 DIAGNOSIS — S43429A Sprain of unspecified rotator cuff capsule, initial encounter: Secondary | ICD-10-CM | POA: Diagnosis not present

## 2011-10-05 DIAGNOSIS — S43429A Sprain of unspecified rotator cuff capsule, initial encounter: Secondary | ICD-10-CM | POA: Diagnosis not present

## 2011-10-08 DIAGNOSIS — S43429A Sprain of unspecified rotator cuff capsule, initial encounter: Secondary | ICD-10-CM | POA: Diagnosis not present

## 2011-10-12 DIAGNOSIS — S43429A Sprain of unspecified rotator cuff capsule, initial encounter: Secondary | ICD-10-CM | POA: Diagnosis not present

## 2011-10-14 DIAGNOSIS — S43429A Sprain of unspecified rotator cuff capsule, initial encounter: Secondary | ICD-10-CM | POA: Diagnosis not present

## 2011-10-19 DIAGNOSIS — S43429A Sprain of unspecified rotator cuff capsule, initial encounter: Secondary | ICD-10-CM | POA: Diagnosis not present

## 2011-10-20 DIAGNOSIS — H353290 Exudative age-related macular degeneration, unspecified eye, stage unspecified: Secondary | ICD-10-CM | POA: Diagnosis not present

## 2011-10-22 DIAGNOSIS — S43429A Sprain of unspecified rotator cuff capsule, initial encounter: Secondary | ICD-10-CM | POA: Diagnosis not present

## 2011-10-27 DIAGNOSIS — H2510 Age-related nuclear cataract, unspecified eye: Secondary | ICD-10-CM | POA: Diagnosis not present

## 2011-10-27 DIAGNOSIS — H353190 Nonexudative age-related macular degeneration, unspecified eye, stage unspecified: Secondary | ICD-10-CM | POA: Diagnosis not present

## 2011-10-27 DIAGNOSIS — H353290 Exudative age-related macular degeneration, unspecified eye, stage unspecified: Secondary | ICD-10-CM | POA: Diagnosis not present

## 2011-10-28 DIAGNOSIS — S43429A Sprain of unspecified rotator cuff capsule, initial encounter: Secondary | ICD-10-CM | POA: Diagnosis not present

## 2011-10-30 DIAGNOSIS — S43429A Sprain of unspecified rotator cuff capsule, initial encounter: Secondary | ICD-10-CM | POA: Diagnosis not present

## 2011-11-02 DIAGNOSIS — S43429A Sprain of unspecified rotator cuff capsule, initial encounter: Secondary | ICD-10-CM | POA: Diagnosis not present

## 2011-11-09 DIAGNOSIS — S43429A Sprain of unspecified rotator cuff capsule, initial encounter: Secondary | ICD-10-CM | POA: Diagnosis not present

## 2011-11-12 DIAGNOSIS — I10 Essential (primary) hypertension: Secondary | ICD-10-CM | POA: Diagnosis not present

## 2011-11-12 DIAGNOSIS — E785 Hyperlipidemia, unspecified: Secondary | ICD-10-CM | POA: Diagnosis not present

## 2011-11-12 DIAGNOSIS — E119 Type 2 diabetes mellitus without complications: Secondary | ICD-10-CM | POA: Diagnosis not present

## 2011-11-20 DIAGNOSIS — S43429A Sprain of unspecified rotator cuff capsule, initial encounter: Secondary | ICD-10-CM | POA: Diagnosis not present

## 2011-11-24 DIAGNOSIS — H353290 Exudative age-related macular degeneration, unspecified eye, stage unspecified: Secondary | ICD-10-CM | POA: Diagnosis not present

## 2011-11-25 DIAGNOSIS — S43429A Sprain of unspecified rotator cuff capsule, initial encounter: Secondary | ICD-10-CM | POA: Diagnosis not present

## 2011-12-03 DIAGNOSIS — R7301 Impaired fasting glucose: Secondary | ICD-10-CM | POA: Diagnosis not present

## 2011-12-03 DIAGNOSIS — T148XXA Other injury of unspecified body region, initial encounter: Secondary | ICD-10-CM | POA: Diagnosis not present

## 2011-12-04 DIAGNOSIS — S43429A Sprain of unspecified rotator cuff capsule, initial encounter: Secondary | ICD-10-CM | POA: Diagnosis not present

## 2011-12-11 DIAGNOSIS — S43429A Sprain of unspecified rotator cuff capsule, initial encounter: Secondary | ICD-10-CM | POA: Diagnosis not present

## 2011-12-16 DIAGNOSIS — S43429A Sprain of unspecified rotator cuff capsule, initial encounter: Secondary | ICD-10-CM | POA: Diagnosis not present

## 2012-01-01 DIAGNOSIS — S43429A Sprain of unspecified rotator cuff capsule, initial encounter: Secondary | ICD-10-CM | POA: Diagnosis not present

## 2012-01-05 DIAGNOSIS — H353290 Exudative age-related macular degeneration, unspecified eye, stage unspecified: Secondary | ICD-10-CM | POA: Diagnosis not present

## 2012-01-07 DIAGNOSIS — S43429A Sprain of unspecified rotator cuff capsule, initial encounter: Secondary | ICD-10-CM | POA: Diagnosis not present

## 2012-01-07 DIAGNOSIS — H353290 Exudative age-related macular degeneration, unspecified eye, stage unspecified: Secondary | ICD-10-CM | POA: Diagnosis not present

## 2012-01-18 DIAGNOSIS — S43429A Sprain of unspecified rotator cuff capsule, initial encounter: Secondary | ICD-10-CM | POA: Diagnosis not present

## 2012-01-25 DIAGNOSIS — S43429A Sprain of unspecified rotator cuff capsule, initial encounter: Secondary | ICD-10-CM | POA: Diagnosis not present

## 2012-01-27 DIAGNOSIS — S43429A Sprain of unspecified rotator cuff capsule, initial encounter: Secondary | ICD-10-CM | POA: Diagnosis not present

## 2012-02-18 DIAGNOSIS — H353290 Exudative age-related macular degeneration, unspecified eye, stage unspecified: Secondary | ICD-10-CM | POA: Diagnosis not present

## 2012-03-03 ENCOUNTER — Ambulatory Visit (INDEPENDENT_AMBULATORY_CARE_PROVIDER_SITE_OTHER): Payer: BC Managed Care – PPO | Admitting: Family Medicine

## 2012-03-03 VITALS — BP 134/74 | HR 70 | Temp 97.8°F | Resp 18 | Ht 68.0 in | Wt 194.0 lb

## 2012-03-03 DIAGNOSIS — M79609 Pain in unspecified limb: Secondary | ICD-10-CM

## 2012-03-03 DIAGNOSIS — M79643 Pain in unspecified hand: Secondary | ICD-10-CM

## 2012-03-03 DIAGNOSIS — Z23 Encounter for immunization: Secondary | ICD-10-CM

## 2012-03-03 DIAGNOSIS — S61409A Unspecified open wound of unspecified hand, initial encounter: Secondary | ICD-10-CM | POA: Diagnosis not present

## 2012-03-03 NOTE — Progress Notes (Signed)
[facility] [address], [place] [address] [phone]  Date:  03/03/2012   Name:  Derrick [name]   DOB:  [birth year]   MRN:  [id number]  PCP:  Minda Meo, MD    Chief Complaint: Laceration   History of Present Illness:  Derrick [name] is a 71 y.o. very pleasant male patient who presents with the following:  Right handed.  He was cutting apart some socks this am when the knife slipped and cut his left index finger.  He is unsure of the date of his last tetanus shot, but thinks it has been some time.  His PCP is Dr. Jacky Kindle, who takes care of his DM, HTN, etc.  Otherwise he is well today  There is no problem list on file for this patient.   Past Medical History  Diagnosis Date  . Diabetes mellitus   . Hypertension   . Hypercholesteremia   . Macular degeneration   . Obstructive sleep apnea   . Complication of anesthesia     "high blood sugar; he's been loopy last 36h since OR"  . Arthritis   . DKA, type 2   . Insulin pump in place     Past Surgical History  Procedure Date  . Shoulder arthroscopy 08/24/11    "frozen shoulder; RCR w/labrum tear; arthritis scraping; bone spurs"  . Tonsillectomy     "in childhood"  . Shoulder arthroscopy ~ 2006    right ; "was frozen"    History  Substance Use Topics  . Smoking status: Former Smoker -- 0.2 packs/day for 15 years    Types: Cigarettes    Quit date: 06/23/1983  . Smokeless tobacco: Never Used  . Alcohol Use: No    No family history on file.  No Known Allergies  Medication list has been reviewed and updated.  Current Outpatient Prescriptions on File Prior to Visit  Medication Sig Dispense Refill  . amLODipine (NORVASC) 10 MG tablet Take 10 mg by mouth daily.      Marland Kitchen aspirin EC 81 MG tablet Take 81 mg by mouth daily.      . insulin aspart (NOVOLOG) 100 UNIT/ML injection Inject 0-15 Units into the skin 3 (three) times daily with meals. Check blood sugars every 4 hours while  awake and use the Humalog pen: Greater than 350 give 12 units, 251-350 give 8 units, 151-250 give 4 units.  1 pen  5  . insulin glargine (LANTUS) 100 UNIT/ML injection Inject 25 Units into the skin daily.  10 mL  5  . labetalol (NORMODYNE) 100 MG tablet Take 100 mg by mouth daily.      Marland Kitchen lisinopril-hydrochlorothiazide (PRINZIDE,ZESTORETIC) 20-12.5 MG per tablet Take 1 tablet by mouth daily.      . Multiple Vitamins-Minerals (ICAPS AREDS FORMULA PO) Take 1 tablet by mouth 2 (two) times daily.      . rosuvastatin (CRESTOR) 20 MG tablet Take 20 mg by mouth daily.        Review of Systems:  As per HPI- otherwise negative.   Physical Examination: Filed Vitals:   03/03/12 1239  BP: 134/74  Pulse: 70  Temp: 97.8 F (36.6 C)  Resp: 18   Filed Vitals:   03/03/12 1239  Height: 5\' 8"  (1.727 m)  Weight: 194 lb (87.998 kg)   Body mass index is 29.50 kg/(m^2). Ideal Body Weight: Weight in (lb) to have BMI = 25: 164.1    GEN: WDWN, NAD, Non-toxic, Alert &  Oriented x 3 HEENT: Atraumatic, Normocephalic.  Ears and Nose: No external deformity. EXTR: No clubbing/cyanosis/edema NEURO: Normal gait.  PSYCH: Normally interactive. Conversant. Not depressed or anxious appearing.  Calm demeanor.  Left index finger- laceration over the pad of the middle phalanx.  No weakness or loss of sensation.  Wound does not appear to be deep.    Assessment and Plan: 1. Wound, open, hand with or without fingers    2. Need for diphtheria-tetanus-pertussis (Tdap) vaccine, adult/adolescent  Tdap vaccine greater than or equal to 7yo IM  3. Pain, hand     Updated tetanus shot with Tdap today.  Wound repaired as per Rhoderick Moody, PAPearline Cables, MD

## 2012-03-03 NOTE — Progress Notes (Signed)
Patient ID: AAMIL MERCHANT MRN: [id number], DOB: 10-Jul-1940, 71 y.o. Date of Encounter: 03/03/2012, 1:27 PM   PROCEDURE NOTE: Verbal consent obtained. Sterile technique employed. Numbing: Metacarpal block with 2% lidocaine plain.  Cleansed with soap and water. Irrigated.  3 cm wound explored, no deep structures involved, no foreign bodies.   Wound repaired with # 5 SI sutures.  Hemostasis obtained. Wound cleansed and dressed.  Wound care instructions including precautions covered with patient. Handout given.  Anticipate suture removal in 7-10 days  Rhoderick Moody, PA-C 03/03/2012 1:27 PM

## 2012-03-03 NOTE — Patient Instructions (Signed)

## 2012-03-08 DIAGNOSIS — H353290 Exudative age-related macular degeneration, unspecified eye, stage unspecified: Secondary | ICD-10-CM | POA: Diagnosis not present

## 2012-03-08 DIAGNOSIS — H353190 Nonexudative age-related macular degeneration, unspecified eye, stage unspecified: Secondary | ICD-10-CM | POA: Diagnosis not present

## 2012-03-11 ENCOUNTER — Ambulatory Visit (INDEPENDENT_AMBULATORY_CARE_PROVIDER_SITE_OTHER): Payer: BC Managed Care – PPO | Admitting: Family Medicine

## 2012-03-11 DIAGNOSIS — S61209A Unspecified open wound of unspecified finger without damage to nail, initial encounter: Secondary | ICD-10-CM

## 2012-03-11 DIAGNOSIS — S61219A Laceration without foreign body of unspecified finger without damage to nail, initial encounter: Secondary | ICD-10-CM

## 2012-03-13 ENCOUNTER — Encounter: Payer: Self-pay | Admitting: Family Medicine

## 2012-03-13 NOTE — Progress Notes (Signed)
   [address]   [place], [place]  [address]   [phone]  Subjective:    Patient ID: Derrick [name], male    DOB: 1940/08/10, 71 y.o.   MRN: [id number]  HPIThis 71 y.o. male presents for suture removal.  Eight day follow-up for suture removal of laceration of L second digit.  No fever/chills/sweats.  No redness, drainage, pain from wound.  Cleansing wound daily with soap and water.  No concerns.   Denies n/t/w of digits, hand.     Review of Systems  Constitutional: Negative for fever, chills and fatigue.  Skin: Positive for wound. Negative for color change, pallor and rash.  Neurological: Negative for weakness and numbness.       Objective:   Physical Exam  Constitutional: He is oriented to person, place, and time. He appears well-developed and well-nourished. No distress.  Musculoskeletal: Normal range of motion.       Full extension and flexion of L second digit.  Motor 5/5.  Neurological: He is alert and oriented to person, place, and time. No sensory deficit.  Skin: Skin is warm and dry. No rash noted. He is not diaphoretic. No erythema. No pallor.       No drainage, erythema present surrounding laceration.  Psychiatric: He has a normal mood and affect. His behavior is normal. Judgment and thought content normal.     PROCEDURE NOTE:  5 SUTURES REMOVED WITH DIFFICULTY.  PATIENT TOLERATED PROCEDURE WELL.      Assessment & Plan:   1. Laceration of finger      1.  Laceration L second digit: improved; s/p suture removal.  No evidence of infection.  Continue with local wound care.

## 2012-03-14 NOTE — Progress Notes (Signed)
Reviewed and agree.

## 2012-03-21 DIAGNOSIS — IMO0001 Reserved for inherently not codable concepts without codable children: Secondary | ICD-10-CM | POA: Diagnosis not present

## 2012-03-21 DIAGNOSIS — I10 Essential (primary) hypertension: Secondary | ICD-10-CM | POA: Diagnosis not present

## 2012-03-21 DIAGNOSIS — Z23 Encounter for immunization: Secondary | ICD-10-CM | POA: Diagnosis not present

## 2012-03-21 DIAGNOSIS — E785 Hyperlipidemia, unspecified: Secondary | ICD-10-CM | POA: Diagnosis not present

## 2012-03-23 DIAGNOSIS — S43429A Sprain of unspecified rotator cuff capsule, initial encounter: Secondary | ICD-10-CM | POA: Diagnosis not present

## 2012-04-05 DIAGNOSIS — H353290 Exudative age-related macular degeneration, unspecified eye, stage unspecified: Secondary | ICD-10-CM | POA: Diagnosis not present

## 2012-05-26 DIAGNOSIS — H353290 Exudative age-related macular degeneration, unspecified eye, stage unspecified: Secondary | ICD-10-CM | POA: Diagnosis not present

## 2012-06-02 DIAGNOSIS — H353290 Exudative age-related macular degeneration, unspecified eye, stage unspecified: Secondary | ICD-10-CM | POA: Diagnosis not present

## 2012-07-07 DIAGNOSIS — H353290 Exudative age-related macular degeneration, unspecified eye, stage unspecified: Secondary | ICD-10-CM | POA: Diagnosis not present

## 2012-07-22 DIAGNOSIS — E785 Hyperlipidemia, unspecified: Secondary | ICD-10-CM | POA: Diagnosis not present

## 2012-07-22 DIAGNOSIS — E119 Type 2 diabetes mellitus without complications: Secondary | ICD-10-CM | POA: Diagnosis not present

## 2012-07-22 DIAGNOSIS — Z125 Encounter for screening for malignant neoplasm of prostate: Secondary | ICD-10-CM | POA: Diagnosis not present

## 2012-07-28 DIAGNOSIS — Z125 Encounter for screening for malignant neoplasm of prostate: Secondary | ICD-10-CM | POA: Diagnosis not present

## 2012-07-28 DIAGNOSIS — Z0000 Encounter for general adult medical examination without abnormal findings: Secondary | ICD-10-CM | POA: Diagnosis not present

## 2012-07-28 DIAGNOSIS — I10 Essential (primary) hypertension: Secondary | ICD-10-CM | POA: Diagnosis not present

## 2012-07-28 DIAGNOSIS — E785 Hyperlipidemia, unspecified: Secondary | ICD-10-CM | POA: Diagnosis not present

## 2012-07-28 DIAGNOSIS — E119 Type 2 diabetes mellitus without complications: Secondary | ICD-10-CM | POA: Diagnosis not present

## 2012-07-28 DIAGNOSIS — Z1331 Encounter for screening for depression: Secondary | ICD-10-CM | POA: Diagnosis not present

## 2012-07-29 DIAGNOSIS — Z4681 Encounter for fitting and adjustment of insulin pump: Secondary | ICD-10-CM | POA: Diagnosis not present

## 2012-07-29 DIAGNOSIS — I10 Essential (primary) hypertension: Secondary | ICD-10-CM | POA: Diagnosis not present

## 2012-07-29 DIAGNOSIS — IMO0001 Reserved for inherently not codable concepts without codable children: Secondary | ICD-10-CM | POA: Diagnosis not present

## 2012-07-29 DIAGNOSIS — Z1212 Encounter for screening for malignant neoplasm of rectum: Secondary | ICD-10-CM | POA: Diagnosis not present

## 2012-08-09 DIAGNOSIS — R0989 Other specified symptoms and signs involving the circulatory and respiratory systems: Secondary | ICD-10-CM | POA: Diagnosis not present

## 2012-08-18 DIAGNOSIS — H353290 Exudative age-related macular degeneration, unspecified eye, stage unspecified: Secondary | ICD-10-CM | POA: Diagnosis not present

## 2012-08-25 DIAGNOSIS — H353290 Exudative age-related macular degeneration, unspecified eye, stage unspecified: Secondary | ICD-10-CM | POA: Diagnosis not present

## 2012-08-25 DIAGNOSIS — H353190 Nonexudative age-related macular degeneration, unspecified eye, stage unspecified: Secondary | ICD-10-CM | POA: Diagnosis not present

## 2012-08-31 ENCOUNTER — Encounter: Payer: Self-pay | Admitting: Internal Medicine

## 2012-09-22 DIAGNOSIS — H353190 Nonexudative age-related macular degeneration, unspecified eye, stage unspecified: Secondary | ICD-10-CM | POA: Diagnosis not present

## 2012-09-22 DIAGNOSIS — H353290 Exudative age-related macular degeneration, unspecified eye, stage unspecified: Secondary | ICD-10-CM | POA: Diagnosis not present

## 2012-09-27 DIAGNOSIS — H353290 Exudative age-related macular degeneration, unspecified eye, stage unspecified: Secondary | ICD-10-CM | POA: Diagnosis not present

## 2012-10-18 DIAGNOSIS — H353190 Nonexudative age-related macular degeneration, unspecified eye, stage unspecified: Secondary | ICD-10-CM | POA: Diagnosis not present

## 2012-10-18 DIAGNOSIS — H353290 Exudative age-related macular degeneration, unspecified eye, stage unspecified: Secondary | ICD-10-CM | POA: Diagnosis not present

## 2012-10-25 DIAGNOSIS — H353290 Exudative age-related macular degeneration, unspecified eye, stage unspecified: Secondary | ICD-10-CM | POA: Diagnosis not present

## 2012-11-15 DIAGNOSIS — H353290 Exudative age-related macular degeneration, unspecified eye, stage unspecified: Secondary | ICD-10-CM | POA: Diagnosis not present

## 2012-11-15 DIAGNOSIS — H2510 Age-related nuclear cataract, unspecified eye: Secondary | ICD-10-CM | POA: Diagnosis not present

## 2012-11-15 DIAGNOSIS — H353190 Nonexudative age-related macular degeneration, unspecified eye, stage unspecified: Secondary | ICD-10-CM | POA: Diagnosis not present

## 2012-11-22 DIAGNOSIS — H353290 Exudative age-related macular degeneration, unspecified eye, stage unspecified: Secondary | ICD-10-CM | POA: Diagnosis not present

## 2012-11-23 DIAGNOSIS — E119 Type 2 diabetes mellitus without complications: Secondary | ICD-10-CM | POA: Diagnosis not present

## 2012-11-23 DIAGNOSIS — E785 Hyperlipidemia, unspecified: Secondary | ICD-10-CM | POA: Diagnosis not present

## 2012-11-23 DIAGNOSIS — E669 Obesity, unspecified: Secondary | ICD-10-CM | POA: Diagnosis not present

## 2012-11-23 DIAGNOSIS — I10 Essential (primary) hypertension: Secondary | ICD-10-CM | POA: Diagnosis not present

## 2012-11-23 DIAGNOSIS — Z6830 Body mass index (BMI) 30.0-30.9, adult: Secondary | ICD-10-CM | POA: Diagnosis not present

## 2012-11-26 DIAGNOSIS — R7301 Impaired fasting glucose: Secondary | ICD-10-CM | POA: Diagnosis not present

## 2012-11-26 DIAGNOSIS — E161 Other hypoglycemia: Secondary | ICD-10-CM | POA: Diagnosis not present

## 2012-12-22 DIAGNOSIS — H353290 Exudative age-related macular degeneration, unspecified eye, stage unspecified: Secondary | ICD-10-CM | POA: Diagnosis not present

## 2012-12-29 DIAGNOSIS — H353290 Exudative age-related macular degeneration, unspecified eye, stage unspecified: Secondary | ICD-10-CM | POA: Diagnosis not present

## 2013-01-04 DIAGNOSIS — D239 Other benign neoplasm of skin, unspecified: Secondary | ICD-10-CM | POA: Diagnosis not present

## 2013-01-04 DIAGNOSIS — L821 Other seborrheic keratosis: Secondary | ICD-10-CM | POA: Diagnosis not present

## 2013-01-04 DIAGNOSIS — D1801 Hemangioma of skin and subcutaneous tissue: Secondary | ICD-10-CM | POA: Diagnosis not present

## 2013-01-04 DIAGNOSIS — L570 Actinic keratosis: Secondary | ICD-10-CM | POA: Diagnosis not present

## 2013-01-04 DIAGNOSIS — L80 Vitiligo: Secondary | ICD-10-CM | POA: Diagnosis not present

## 2013-02-16 DIAGNOSIS — H353290 Exudative age-related macular degeneration, unspecified eye, stage unspecified: Secondary | ICD-10-CM | POA: Diagnosis not present

## 2013-02-18 DIAGNOSIS — E161 Other hypoglycemia: Secondary | ICD-10-CM | POA: Diagnosis not present

## 2013-02-18 DIAGNOSIS — R4182 Altered mental status, unspecified: Secondary | ICD-10-CM | POA: Diagnosis not present

## 2013-04-06 DIAGNOSIS — Z23 Encounter for immunization: Secondary | ICD-10-CM | POA: Diagnosis not present

## 2013-04-20 DIAGNOSIS — H353290 Exudative age-related macular degeneration, unspecified eye, stage unspecified: Secondary | ICD-10-CM | POA: Diagnosis not present

## 2013-05-05 ENCOUNTER — Encounter: Payer: Self-pay | Admitting: Internal Medicine

## 2013-05-23 DIAGNOSIS — I10 Essential (primary) hypertension: Secondary | ICD-10-CM | POA: Diagnosis not present

## 2013-05-23 DIAGNOSIS — IMO0001 Reserved for inherently not codable concepts without codable children: Secondary | ICD-10-CM | POA: Diagnosis not present

## 2013-05-23 DIAGNOSIS — Z6830 Body mass index (BMI) 30.0-30.9, adult: Secondary | ICD-10-CM | POA: Diagnosis not present

## 2013-06-27 DIAGNOSIS — H353290 Exudative age-related macular degeneration, unspecified eye, stage unspecified: Secondary | ICD-10-CM | POA: Diagnosis not present

## 2013-07-26 DIAGNOSIS — E785 Hyperlipidemia, unspecified: Secondary | ICD-10-CM | POA: Diagnosis not present

## 2013-07-26 DIAGNOSIS — Z125 Encounter for screening for malignant neoplasm of prostate: Secondary | ICD-10-CM | POA: Diagnosis not present

## 2013-07-26 DIAGNOSIS — IMO0001 Reserved for inherently not codable concepts without codable children: Secondary | ICD-10-CM | POA: Diagnosis not present

## 2013-08-02 DIAGNOSIS — E119 Type 2 diabetes mellitus without complications: Secondary | ICD-10-CM | POA: Diagnosis not present

## 2013-08-02 DIAGNOSIS — E785 Hyperlipidemia, unspecified: Secondary | ICD-10-CM | POA: Diagnosis not present

## 2013-08-02 DIAGNOSIS — Z6830 Body mass index (BMI) 30.0-30.9, adult: Secondary | ICD-10-CM | POA: Diagnosis not present

## 2013-08-02 DIAGNOSIS — I10 Essential (primary) hypertension: Secondary | ICD-10-CM | POA: Diagnosis not present

## 2013-08-02 DIAGNOSIS — E669 Obesity, unspecified: Secondary | ICD-10-CM | POA: Diagnosis not present

## 2013-08-02 DIAGNOSIS — Z0000 Encounter for general adult medical examination without abnormal findings: Secondary | ICD-10-CM | POA: Diagnosis not present

## 2013-08-02 DIAGNOSIS — Z1331 Encounter for screening for depression: Secondary | ICD-10-CM | POA: Diagnosis not present

## 2013-08-02 DIAGNOSIS — Z125 Encounter for screening for malignant neoplasm of prostate: Secondary | ICD-10-CM | POA: Diagnosis not present

## 2013-08-04 DIAGNOSIS — Z1212 Encounter for screening for malignant neoplasm of rectum: Secondary | ICD-10-CM | POA: Diagnosis not present

## 2013-08-22 DIAGNOSIS — Z4681 Encounter for fitting and adjustment of insulin pump: Secondary | ICD-10-CM | POA: Diagnosis not present

## 2013-08-22 DIAGNOSIS — I10 Essential (primary) hypertension: Secondary | ICD-10-CM | POA: Diagnosis not present

## 2013-08-22 DIAGNOSIS — Z6830 Body mass index (BMI) 30.0-30.9, adult: Secondary | ICD-10-CM | POA: Diagnosis not present

## 2013-08-22 DIAGNOSIS — IMO0001 Reserved for inherently not codable concepts without codable children: Secondary | ICD-10-CM | POA: Diagnosis not present

## 2013-09-05 DIAGNOSIS — H353290 Exudative age-related macular degeneration, unspecified eye, stage unspecified: Secondary | ICD-10-CM | POA: Diagnosis not present

## 2013-09-05 DIAGNOSIS — E11319 Type 2 diabetes mellitus with unspecified diabetic retinopathy without macular edema: Secondary | ICD-10-CM | POA: Diagnosis not present

## 2013-09-05 DIAGNOSIS — E1139 Type 2 diabetes mellitus with other diabetic ophthalmic complication: Secondary | ICD-10-CM | POA: Diagnosis not present

## 2013-09-05 DIAGNOSIS — H353190 Nonexudative age-related macular degeneration, unspecified eye, stage unspecified: Secondary | ICD-10-CM | POA: Diagnosis not present

## 2013-10-24 DIAGNOSIS — H353290 Exudative age-related macular degeneration, unspecified eye, stage unspecified: Secondary | ICD-10-CM | POA: Diagnosis not present

## 2013-11-01 DIAGNOSIS — IMO0001 Reserved for inherently not codable concepts without codable children: Secondary | ICD-10-CM | POA: Diagnosis not present

## 2013-11-01 DIAGNOSIS — I10 Essential (primary) hypertension: Secondary | ICD-10-CM | POA: Diagnosis not present

## 2013-11-09 DIAGNOSIS — H35359 Cystoid macular degeneration, unspecified eye: Secondary | ICD-10-CM | POA: Diagnosis not present

## 2013-11-15 ENCOUNTER — Institutional Professional Consult (permissible substitution): Payer: Medicare Other | Admitting: Neurology

## 2013-12-12 DIAGNOSIS — E1139 Type 2 diabetes mellitus with other diabetic ophthalmic complication: Secondary | ICD-10-CM | POA: Diagnosis not present

## 2013-12-12 DIAGNOSIS — E11319 Type 2 diabetes mellitus with unspecified diabetic retinopathy without macular edema: Secondary | ICD-10-CM | POA: Diagnosis not present

## 2013-12-12 DIAGNOSIS — E11311 Type 2 diabetes mellitus with unspecified diabetic retinopathy with macular edema: Secondary | ICD-10-CM | POA: Diagnosis not present

## 2013-12-27 DIAGNOSIS — R4182 Altered mental status, unspecified: Secondary | ICD-10-CM | POA: Diagnosis not present

## 2013-12-27 DIAGNOSIS — E161 Other hypoglycemia: Secondary | ICD-10-CM | POA: Diagnosis not present

## 2014-01-15 DIAGNOSIS — E1139 Type 2 diabetes mellitus with other diabetic ophthalmic complication: Secondary | ICD-10-CM | POA: Diagnosis not present

## 2014-01-31 ENCOUNTER — Encounter: Payer: Self-pay | Admitting: Internal Medicine

## 2014-01-31 DIAGNOSIS — Z6829 Body mass index (BMI) 29.0-29.9, adult: Secondary | ICD-10-CM | POA: Diagnosis not present

## 2014-01-31 DIAGNOSIS — E119 Type 2 diabetes mellitus without complications: Secondary | ICD-10-CM | POA: Diagnosis not present

## 2014-01-31 DIAGNOSIS — I10 Essential (primary) hypertension: Secondary | ICD-10-CM | POA: Diagnosis not present

## 2014-01-31 DIAGNOSIS — E785 Hyperlipidemia, unspecified: Secondary | ICD-10-CM | POA: Diagnosis not present

## 2014-02-27 DIAGNOSIS — E11319 Type 2 diabetes mellitus with unspecified diabetic retinopathy without macular edema: Secondary | ICD-10-CM | POA: Diagnosis not present

## 2014-02-27 DIAGNOSIS — E1039 Type 1 diabetes mellitus with other diabetic ophthalmic complication: Secondary | ICD-10-CM | POA: Diagnosis not present

## 2014-02-27 DIAGNOSIS — H353290 Exudative age-related macular degeneration, unspecified eye, stage unspecified: Secondary | ICD-10-CM | POA: Diagnosis not present

## 2014-02-27 DIAGNOSIS — E11311 Type 2 diabetes mellitus with unspecified diabetic retinopathy with macular edema: Secondary | ICD-10-CM | POA: Diagnosis not present

## 2014-03-29 ENCOUNTER — Ambulatory Visit (AMBULATORY_SURGERY_CENTER): Payer: Medicare Other | Admitting: *Deleted

## 2014-03-29 ENCOUNTER — Telehealth: Payer: Self-pay | Admitting: *Deleted

## 2014-03-29 VITALS — Ht 68.0 in | Wt 198.6 lb

## 2014-03-29 DIAGNOSIS — Z86010 Personal history of colonic polyps: Secondary | ICD-10-CM

## 2014-03-29 MED ORDER — MOVIPREP 100 G PO SOLR: ORAL | Status: DC

## 2014-03-29 NOTE — Telephone Encounter (Signed)
Derrick [name]: pt is scheduled for colonoscopy with Derrick [name] on 04/12/2014.  He is diabetic with an insulin pump managed by Derrick [name].  Please send letter to Derrick [name] for instructions on managing pump day before procedure and day of procedure.  Pt can be reached at [phone] (H) after instructions received.  Thanks, Juliann Pulse in [facility]

## 2014-03-29 NOTE — Progress Notes (Signed)
No allergies to eggs or soy. No problems with anesthesia.  Pt given Emmi instructions for colonoscopy  No oxygen use  No diet drug use  

## 2014-03-30 NOTE — Telephone Encounter (Signed)
Faxed insulin pump instructions to Dr. [name].  Awaiting response

## 2014-04-02 ENCOUNTER — Encounter: Payer: Self-pay | Admitting: Internal Medicine

## 2014-04-04 NOTE — Telephone Encounter (Signed)
Faxed insulin pump instructions to different fax number.  Awaiting response.

## 2014-04-06 NOTE — Telephone Encounter (Signed)
Spoke to patient and relayed insulin pump instructions received from Dr. [name].  Patient repeated them back to me.  He agreed to call back with any questions or concerns

## 2014-04-11 ENCOUNTER — Telehealth: Payer: Self-pay

## 2014-04-11 NOTE — Telephone Encounter (Signed)
Left a second message with Dr. Jacquiline Doe nurse requesting insulin pump instructions for patient.  Stressed that his procedure is tomorrow.  Awaiting response.  Called patient and let him know I was waiting.

## 2014-04-11 NOTE — Telephone Encounter (Signed)
Gave patient the insulin pump instructions he had been awaiting.  They are as follows:  Beginning at midnight the night prior to the procedure, all of the patient's insulin pump basal rates are to be set to 0.7 through the procedure.  Once he is awake and eating, all basal rates may be adjusted back to his normal rates.  Normal basal rates are as follows:  12a: 0.7; 4a: 1.0; 8a:1.4; 5p: 1.9   Patient acknowledged and understood.

## 2014-04-11 NOTE — Telephone Encounter (Signed)
Patient misplaced insulin pump instructions.  I told him I would call Dr. Jacquiline Doe office and get them again for him.  Awaiting call back from [place].

## 2014-04-12 ENCOUNTER — Ambulatory Visit (AMBULATORY_SURGERY_CENTER): Payer: Medicare Other | Admitting: Internal Medicine

## 2014-04-12 ENCOUNTER — Encounter: Payer: Self-pay | Admitting: Internal Medicine

## 2014-04-12 VITALS — BP 139/71 | HR 77 | Temp 97.4°F | Resp 17 | Ht 68.0 in | Wt 198.0 lb

## 2014-04-12 DIAGNOSIS — Z86010 Personal history of colonic polyps: Secondary | ICD-10-CM

## 2014-04-12 DIAGNOSIS — K635 Polyp of colon: Secondary | ICD-10-CM | POA: Diagnosis not present

## 2014-04-12 DIAGNOSIS — E119 Type 2 diabetes mellitus without complications: Secondary | ICD-10-CM | POA: Diagnosis not present

## 2014-04-12 DIAGNOSIS — I10 Essential (primary) hypertension: Secondary | ICD-10-CM | POA: Diagnosis not present

## 2014-04-12 DIAGNOSIS — D122 Benign neoplasm of ascending colon: Secondary | ICD-10-CM

## 2014-04-12 LAB — GLUCOSE, CAPILLARY
Glucose-Capillary: 136 mg/dL — ABNORMAL HIGH (ref 70–99)
Glucose-Capillary: 185 mg/dL — ABNORMAL HIGH (ref 70–99)

## 2014-04-12 MED ORDER — SODIUM CHLORIDE 0.9 % IV SOLN: 500.0000 mL | INTRAVENOUS | Status: DC

## 2014-04-12 NOTE — Op Note (Signed)
[facility]  [address] [place], [address]   COLONOSCOPY PROCEDURE REPORT  PATIENT: Derrick, [name]  MR#: [id number] BIRTHDATE: 07/16/1940 , 73  yrs. old GENDER: male ENDOSCOPIST: Eustace Quail, MD REFERRED OA:[id number] Program Recall PROCEDURE DATE:  04/12/2014 PROCEDURE:   Colonoscopy with snare polypectomy x 1 First Screening Colonoscopy - Avg.  risk and is 50 yrs.  old or older - No.  Prior Negative Screening - Now for repeat screening. N/A  History of Adenoma - Now for follow-up colonoscopy & has been > or = to 3 yrs.  Yes hx of adenoma.  Has been 3 or more years since last colonoscopy.  Polyps Removed Today? Yes. ASA CLASS:   Class II INDICATIONS:surveillance colonoscopy based on a history of adenomatous colonic polyp(s).  Prior exams 2003, 2009 MEDICATIONS: Monitored anesthesia care and Propofol 200 mg IV  DESCRIPTION OF PROCEDURE:   After the risks benefits and alternatives of the procedure were thoroughly explained, informed consent was obtained.  The digital rectal exam revealed no abnormalities of the rectum.   The LB [id number]  endoscope was introduced through the anus and advanced to the cecum, which was identified by both the appendix and ileocecal valve. No adverse events experienced.   The quality of the prep was good, using MoviPrep  The instrument was then slowly withdrawn as the colon was fully examined.  COLON FINDINGS: A single polyp measuring 3 mm in size was found in the ascending colon.  A polypectomy was performed with a cold snare.  The resection was complete, the polyp tissue was completely retrieved and sent to histology.   There was moderate diverticulosis noted in the left colon and right colon.   The examination was otherwise normal.  Retroflexed views revealed internal hemorrhoids. The time to cecum=2 minutes 13 seconds. Withdrawal time=10 minutes 38 seconds.  The scope was withdrawn and the procedure  completed. COMPLICATIONS: There were no immediate complications.  ENDOSCOPIC IMPRESSION: 1.   Single polyp measuring 3 mm in size was found in the ascending colon; polypectomy was performed with a cold snare 2.   Moderate diverticulosis was noted in the left colon and right colon 3.   The examination was otherwise normal  RECOMMENDATIONS: 1. Repeat colonoscopy in 5 years if polyp adenomatous; otherwise prn  eSigned:  Eustace Quail, MD 04/12/2014 9:47 AM   cc: Burnard Bunting, MD and The Patient

## 2014-04-12 NOTE — Progress Notes (Signed)
Alphonsa Gin, RN resumed insuline pump for the pt.  No complaints noted in the recovery room.  maw

## 2014-04-12 NOTE — Progress Notes (Signed)
Patient with insulin pump, injection site left abdomen. Patient' pump suspended by Alphonsa Gin RN

## 2014-04-12 NOTE — Progress Notes (Signed)
  [facility] Post-op Note  Patient: Derrick [name]  Procedure(s) Performed: colonoscopy  Patient Location: [facility] - Recovery Area  Anesthesia Type: Deep Sedation/Propofol  Level of Consciousness: awake, oriented and patient cooperative  Airway and Oxygen Therapy: Patient Spontanous Breathing  Post-op Pain: none  Post-op Assessment:  Post-op Vital signs reviewed, Patient's Cardiovascular Status Stable, Respiratory Function Stable, Patent Airway, No signs of Nausea or vomiting and Pain level controlled  Post-op Vital Signs: Reviewed and stable  Complications: No apparent anesthesia complications  Gjon Letarte E 9:47 AM

## 2014-04-12 NOTE — Progress Notes (Signed)
Called to room to assist during endoscopic procedure.  Patient ID and intended procedure confirmed with present staff. Received instructions for my participation in the procedure from the performing physician.  

## 2014-04-12 NOTE — Patient Instructions (Signed)
YOU HAD AN ENDOSCOPIC PROCEDURE TODAY AT THE [facility]: Refer to the procedure report that was given to you for any specific questions about what was found during the examination.  If the procedure report does not answer your questions, please call your gastroenterologist to clarify.  If you requested that your care partner not be given the details of your procedure findings, then the procedure report has been included in a sealed envelope for you to review at your convenience later.  YOU SHOULD EXPECT: Some feelings of bloating in the abdomen. Passage of more gas than usual.  Walking can help get rid of the air that was put into your GI tract during the procedure and reduce the bloating. If you had a lower endoscopy (such as a colonoscopy or flexible sigmoidoscopy) you may notice spotting of blood in your stool or on the toilet paper. If you underwent a bowel prep for your procedure, then you may not have a normal bowel movement for a few days.  DIET: Your first meal following the procedure should be a light meal and then it is ok to progress to your normal diet.  A half-sandwich or bowl of soup is an example of a good first meal.  Heavy or fried foods are harder to digest and may make you feel nauseous or bloated.  Likewise meals heavy in dairy and vegetables can cause extra gas to form and this can also increase the bloating.  Drink plenty of fluids but you should avoid alcoholic beverages for 24 hours.  ACTIVITY: Your care partner should take you home directly after the procedure.  You should plan to take it easy, moving slowly for the rest of the day.  You can resume normal activity the day after the procedure however you should NOT DRIVE or use heavy machinery for 24 hours (because of the sedation medicines used during the test).    SYMPTOMS TO REPORT IMMEDIATELY: A gastroenterologist can be reached at any hour.  During normal business hours, 8:30 AM to 5:00 PM Monday through Friday,  call [phone].  After hours and on weekends, please call the GI answering service at [phone] who will take a message and have the physician on call contact you.   Following lower endoscopy (colonoscopy or flexible sigmoidoscopy):  Excessive amounts of blood in the stool  Significant tenderness or worsening of abdominal pains  Swelling of the abdomen that is new, acute  Fever of 100F or higher  FOLLOW UP: If any biopsies were taken you will be contacted by phone or by letter within the next 1-3 weeks.  Call your gastroenterologist if you have not heard about the biopsies in 3 weeks.  Our staff will call the home number listed on your records the next business day following your procedure to check on you and address any questions or concerns that you may have at that time regarding the information given to you following your procedure. This is a courtesy call and so if there is no answer at the home number and we have not heard from you through the emergency physician on call, we will assume that you have returned to your regular daily activities without incident.  SIGNATURES/CONFIDENTIALITY: You and/or your care partner have signed paperwork which will be entered into your electronic medical record.  These signatures attest to the fact that that the information above on your After Visit Summary has been reviewed and is understood.  Full responsibility of the confidentiality of this   discharge information lies with you and/or your care-partner.     Handouts were given to your care partner on polyps, diverticulosis, and a high fiber diet with liberal fluid intake. You may resume your current medications today. Await biopsy results. Please call if any questions or concerns. Your blood sugar was 185 in the recovery room.

## 2014-04-12 NOTE — Progress Notes (Signed)
Insulin pump is intact per the pt.  maw

## 2014-04-13 ENCOUNTER — Telehealth: Payer: Self-pay | Admitting: *Deleted

## 2014-04-13 NOTE — Telephone Encounter (Signed)
  Follow up Call-  Call back number 04/12/2014  Post procedure Call Back phone  # [phone]  Permission to leave phone message Yes     Patient questions:  Do you have a fever, pain , or abdominal swelling? No. Pain Score  0 *  Have you tolerated food without any problems? Yes.    Have you been able to return to your normal activities? Yes.    Do you have any questions about your discharge instructions: Diet   No. Medications  No. Follow up visit  No.  Do you have questions or concerns about your Care? No.  Actions: * If pain score is 4 or above: No action needed, pain <4.

## 2014-04-17 ENCOUNTER — Encounter: Payer: Self-pay | Admitting: Internal Medicine

## 2014-05-15 DIAGNOSIS — E11339 Type 2 diabetes mellitus with moderate nonproliferative diabetic retinopathy without macular edema: Secondary | ICD-10-CM | POA: Diagnosis not present

## 2014-05-15 DIAGNOSIS — H3531 Nonexudative age-related macular degeneration: Secondary | ICD-10-CM | POA: Diagnosis not present

## 2014-05-15 DIAGNOSIS — H3532 Exudative age-related macular degeneration: Secondary | ICD-10-CM | POA: Diagnosis not present

## 2014-06-28 DIAGNOSIS — H40013 Open angle with borderline findings, low risk, bilateral: Secondary | ICD-10-CM | POA: Diagnosis not present

## 2014-07-06 DIAGNOSIS — Z1389 Encounter for screening for other disorder: Secondary | ICD-10-CM | POA: Diagnosis not present

## 2014-07-06 DIAGNOSIS — E119 Type 2 diabetes mellitus without complications: Secondary | ICD-10-CM | POA: Diagnosis not present

## 2014-07-06 DIAGNOSIS — Z6830 Body mass index (BMI) 30.0-30.9, adult: Secondary | ICD-10-CM | POA: Diagnosis not present

## 2014-07-06 DIAGNOSIS — E785 Hyperlipidemia, unspecified: Secondary | ICD-10-CM | POA: Diagnosis not present

## 2014-07-06 DIAGNOSIS — I10 Essential (primary) hypertension: Secondary | ICD-10-CM | POA: Diagnosis not present

## 2014-07-25 DIAGNOSIS — E161 Other hypoglycemia: Secondary | ICD-10-CM | POA: Diagnosis not present

## 2014-07-25 DIAGNOSIS — I10 Essential (primary) hypertension: Secondary | ICD-10-CM | POA: Diagnosis not present

## 2014-07-25 DIAGNOSIS — R4182 Altered mental status, unspecified: Secondary | ICD-10-CM | POA: Diagnosis not present

## 2014-07-25 DIAGNOSIS — F17201 Nicotine dependence, unspecified, in remission: Secondary | ICD-10-CM | POA: Diagnosis not present

## 2014-07-25 DIAGNOSIS — R7309 Other abnormal glucose: Secondary | ICD-10-CM | POA: Diagnosis not present

## 2014-07-25 DIAGNOSIS — Z794 Long term (current) use of insulin: Secondary | ICD-10-CM | POA: Diagnosis not present

## 2014-07-25 DIAGNOSIS — E11649 Type 2 diabetes mellitus with hypoglycemia without coma: Secondary | ICD-10-CM | POA: Diagnosis not present

## 2014-07-31 DIAGNOSIS — Z6830 Body mass index (BMI) 30.0-30.9, adult: Secondary | ICD-10-CM | POA: Diagnosis not present

## 2014-07-31 DIAGNOSIS — Z4681 Encounter for fitting and adjustment of insulin pump: Secondary | ICD-10-CM | POA: Diagnosis not present

## 2014-07-31 DIAGNOSIS — E1165 Type 2 diabetes mellitus with hyperglycemia: Secondary | ICD-10-CM | POA: Diagnosis not present

## 2014-07-31 DIAGNOSIS — I10 Essential (primary) hypertension: Secondary | ICD-10-CM | POA: Diagnosis not present

## 2014-08-10 DIAGNOSIS — Z1212 Encounter for screening for malignant neoplasm of rectum: Secondary | ICD-10-CM | POA: Diagnosis not present

## 2014-08-10 DIAGNOSIS — Z125 Encounter for screening for malignant neoplasm of prostate: Secondary | ICD-10-CM | POA: Diagnosis not present

## 2014-08-10 DIAGNOSIS — E119 Type 2 diabetes mellitus without complications: Secondary | ICD-10-CM | POA: Diagnosis not present

## 2014-08-10 DIAGNOSIS — R8290 Unspecified abnormal findings in urine: Secondary | ICD-10-CM | POA: Diagnosis not present

## 2014-08-10 DIAGNOSIS — N390 Urinary tract infection, site not specified: Secondary | ICD-10-CM | POA: Diagnosis not present

## 2014-08-10 DIAGNOSIS — E785 Hyperlipidemia, unspecified: Secondary | ICD-10-CM | POA: Diagnosis not present

## 2014-08-10 DIAGNOSIS — I10 Essential (primary) hypertension: Secondary | ICD-10-CM | POA: Diagnosis not present

## 2014-08-14 DIAGNOSIS — H3532 Exudative age-related macular degeneration: Secondary | ICD-10-CM | POA: Diagnosis not present

## 2014-08-14 DIAGNOSIS — H43813 Vitreous degeneration, bilateral: Secondary | ICD-10-CM | POA: Diagnosis not present

## 2014-08-14 DIAGNOSIS — H3531 Nonexudative age-related macular degeneration: Secondary | ICD-10-CM | POA: Diagnosis not present

## 2014-08-14 DIAGNOSIS — E11339 Type 2 diabetes mellitus with moderate nonproliferative diabetic retinopathy without macular edema: Secondary | ICD-10-CM | POA: Diagnosis not present

## 2014-08-17 DIAGNOSIS — Z1389 Encounter for screening for other disorder: Secondary | ICD-10-CM | POA: Diagnosis not present

## 2014-08-17 DIAGNOSIS — Z125 Encounter for screening for malignant neoplasm of prostate: Secondary | ICD-10-CM | POA: Diagnosis not present

## 2014-08-17 DIAGNOSIS — Z0000 Encounter for general adult medical examination without abnormal findings: Secondary | ICD-10-CM | POA: Diagnosis not present

## 2014-08-17 DIAGNOSIS — I10 Essential (primary) hypertension: Secondary | ICD-10-CM | POA: Diagnosis not present

## 2014-08-17 DIAGNOSIS — Z6829 Body mass index (BMI) 29.0-29.9, adult: Secondary | ICD-10-CM | POA: Diagnosis not present

## 2014-08-17 DIAGNOSIS — E785 Hyperlipidemia, unspecified: Secondary | ICD-10-CM | POA: Diagnosis not present

## 2014-08-17 DIAGNOSIS — E669 Obesity, unspecified: Secondary | ICD-10-CM | POA: Diagnosis not present

## 2014-08-17 DIAGNOSIS — E1129 Type 2 diabetes mellitus with other diabetic kidney complication: Secondary | ICD-10-CM | POA: Diagnosis not present

## 2014-08-17 DIAGNOSIS — N182 Chronic kidney disease, stage 2 (mild): Secondary | ICD-10-CM | POA: Diagnosis not present

## 2014-08-20 ENCOUNTER — Encounter: Payer: Self-pay | Admitting: Neurology

## 2014-09-05 ENCOUNTER — Telehealth: Payer: Self-pay | Admitting: Neurology

## 2014-09-05 NOTE — Telephone Encounter (Signed)
Left VM that appointment on 09/11/14 at 1:00 with Dr Rexene Alberts has been changed to Dr Dohmeier(previously seen patient).

## 2014-09-10 ENCOUNTER — Telehealth: Payer: Self-pay | Admitting: Neurology

## 2014-09-10 NOTE — Telephone Encounter (Signed)
Called to confirm appointment for 09/11/14 with Dr Brett Fairy, confirmed with patient.

## 2014-09-11 ENCOUNTER — Encounter: Payer: Self-pay | Admitting: Neurology

## 2014-09-11 ENCOUNTER — Ambulatory Visit (INDEPENDENT_AMBULATORY_CARE_PROVIDER_SITE_OTHER): Payer: Medicare Other | Admitting: Neurology

## 2014-09-11 ENCOUNTER — Institutional Professional Consult (permissible substitution): Payer: Medicare Other | Admitting: Neurology

## 2014-09-11 VITALS — BP 125/62 | HR 67 | Resp 18 | Ht 68.0 in | Wt 196.0 lb

## 2014-09-11 DIAGNOSIS — Z9989 Dependence on other enabling machines and devices: Principal | ICD-10-CM

## 2014-09-11 DIAGNOSIS — G4733 Obstructive sleep apnea (adult) (pediatric): Secondary | ICD-10-CM

## 2014-09-11 DIAGNOSIS — E669 Obesity, unspecified: Secondary | ICD-10-CM

## 2014-09-11 HISTORY — DX: Obstructive sleep apnea (adult) (pediatric): G47.33

## 2014-09-11 NOTE — Progress Notes (Signed)
SLEEP MEDICINE CLINIC   Provider:  Larey Seat, M D  Referring Provider: Burnard Bunting, MD Primary Care Physician:  Geoffery Lyons, MD  Chief Complaint  Patient presents with  . Re-established on C-PAP    RM -10 -     HPI:  Derrick [name] is a 74 y.o. male , seen here as a referral from Dr. [name] for a re evaluation of sleep apnea and CPAP treatment.  I have seen this patient last time 4 years ago in an office visit. Originally referred by Dr. [name] then,  this patient came to me initially because his wife was concerned about his snoring and occasional pausing in his breath.  The patient had reported that he had gained weight prior to being finally evaluated by the sleep study he had a history of hypertension, hearing loss, diabetes and a remote tobacco use history of about 40 pack years. His he had also reported nocturia at the time. The patient's sleep study took place on 1-30 06-2010. And revealed an AHI that off 24.1 per hour of sleep. He also had 11 central apneas but the vast majority were 118 obstructive apneas. There was not much of a positional component to his sleep but in rem sleep his AHI exacerbated 258.9 the patient's lowest oxygen saturation was 76% was 21 minutes of desaturation time and Toprol. She returned for a CPAP titration and was titrated to 12 cm water. In his first download he had a 90% reduction of apneas and reached an AHI of 2.5. Also he was happy with the CPAP results he was initially not liking it too much. Patient is an insulin pump where a and had also reported that he had taken Celebrex when I last saw him in 2012 and he was concerned that some of his lower extremity edema that he presented with may have been Celebrex related. [name] tells me today that he has used his CPAP machine and his current Epworth sleepiness score is endorsed at 10 points and his fatigue severity at 18 points. He does have a higher blood pressure and in his review of  systems endorsed snoring hearing loss, erectile dysfunction and aching muscles, also bilateral shoulder injuries rotator cuff injuries.  The patient endorsed a bed time, 10.30 PM and goes to sleep usually within less than 15 minutes. He will wake only once ( if at all ) at 2 AM  for a bathroom break , not longer 3-4 as before CPAP.  He wakes up at 6 AM spontaneously and doesn't usually get up right away. He watches his blood sugar and will have breakfast depending on his glucose levels. He attends the hope class [employer] helping older people exercise, and often eats after the exercise.  He will drink about 3-4 cups of coffee a day, at lunch and dinner he will drink caffeinated sodas. He does not drink iced tea. He regularly naps in the afternoon, and apical normally last about one hour and she usually does not use his CPAP for the nap time. He naps usually in a recliner.  His bed room is about 68 degrees fahrenheit, and he reports it is quiet and dark. He shares a bedroom with his wife, the couple lives alone they do not have pets at this time. The patient never had neck surgery. He had his tonsils removed in childhood, there has been no other facial or ENT surgery.  The patient is currently on 4 substances to control his blood pressure.  Lisinopril, HCTZ that, Norvasc, labetalol.  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 He lives near [place].     Review of Systems: Out of a complete 14 system review, the patient complains of only the following symptoms, and all other reviewed systems are negative.  In  his review of systems endorsed snoring hearing loss, erectile dysfunction and aching muscles, also bilateral shoulder injuries rotator cuff injuries.   Epworth score 10 , Fatigue severity score 18  , depression score 0   History   Social History  . Marital Status: Married    Spouse Name: Derrick [name]   . Number of Children: 2  . Years of Education:  masters   Occupational History  .      Retired.   Social History Main Topics  . Smoking status: Former Smoker -- 0.25 packs/day for 15 years    Types: Cigarettes    Quit date: 06/23/1983  . Smokeless tobacco: Never Used  . Alcohol Use: No  . Drug Use: No  . Sexual Activity: Yes   Other Topics Concern  . Not on file   Social History Narrative   Patient lives at home with his wife Derrick [name]).   Retired.   Education Master's degree.   Right handed.   Caffeine three cups of coffee daily.    Family History  Problem Relation Age of Onset  . Colon cancer Neg Hx   . Heart disease Father   . High blood pressure Father     Past Medical History  Diagnosis Date  . Diabetes mellitus   . Hypertension   . Hypercholesteremia   . Macular degeneration   . Obstructive sleep apnea   . Complication of anesthesia     "high blood sugar; he's been loopy last 36h since OR"  . Arthritis   . DKA, type 2   . Insulin pump in place   . Sleep apnea     cpap  . Cataract   . OSA on CPAP 09/11/2014    Past Surgical History  Procedure Laterality Date  . Shoulder arthroscopy Right 08/24/11    "frozen shoulder; RCR w/labrum tear; arthritis scraping; bone spurs"  . Tonsillectomy  1949    "in childhood"  . Shoulder arthroscopy Left ~ 2006    Current Outpatient Prescriptions  Medication Sig Dispense Refill  . amLODipine (NORVASC) 10 MG tablet Take 10 mg by mouth daily.    Marland Kitchen aspirin EC 81 MG tablet Take 81 mg by mouth daily.    . insulin aspart (NOVOLOG) 100 UNIT/ML injection Inject 0-15 Units into the skin 3 (three) times daily with meals. Check blood sugars every 4 hours while awake and use the Humalog pen: Greater than 350 give 12 units, 251-350 give 8 units, 151-250 give 4 units. 1 pen 5  . insulin glargine (LANTUS) 100 UNIT/ML injection Inject 25 Units into the skin daily. 10 mL 5  . Insulin Human (INSULIN PUMP) SOLN Inject into the skin.    Marland Kitchen labetalol (NORMODYNE) 100 MG tablet Take 100 mg  by mouth daily.    Marland Kitchen lisinopril-hydrochlorothiazide (PRINZIDE,ZESTORETIC) 20-12.5 MG per tablet Take 1 tablet by mouth daily.    . Multiple Vitamins-Minerals (ICAPS AREDS FORMULA PO) Take 1 tablet by mouth 2 (two) times daily.    . rosuvastatin (CRESTOR) 20 MG tablet Take 20 mg by mouth daily.     No current facility-administered medications for this visit.    Allergies as of 09/11/2014  . (  No Known Allergies)    Vitals: BP 125/62 mmHg  Pulse 67  Resp 18  Ht 5\' 8"  (1.727 m)  Wt 196 lb (88.905 kg)  BMI 29.81 kg/m2 Last Weight:  Wt Readings from Last 1 Encounters:  09/11/14 196 lb (88.905 kg)       Last Height:   Ht Readings from Last 1 Encounters:  09/11/14 5\' 8"  (1.727 m)    Physical exam:  General: The patient is awake, alert and appears not in acute distress. The patient is well groomed. Head: Normocephalic, atraumatic. Neck is supple. Mallampati 3,  neck circumference: 18 . Nasal airflow unrestricted , TMJ is  Not  evident . Retrognathia is mild .  Cardiovascular:  Regular rate and rhythm, without  murmurs or carotid bruit, and without distended neck veins. Respiratory: Lungs are clear to auscultation. Skin:  Without evidence of edema, or rash Trunk: BMI is  elevated and patient  has normal posture.  Neurologic exam : The patient is awake and alert, oriented to place and time.   Memory subjective  described as intact.  There is a normal attention span & concentration ability. Speech is fluent without dysarthria, dysphonia or aphasia. Mood and affect are appropriate.  Cranial nerves: Pupils are equal and briskly reactive to light. Funduscopic exam without evidence of pallor or edema.  Extraocular movements  in vertical and horizontal planes intact and without nystagmus. Visual fields by finger perimetry are intact. Hearing to finger rub right more impaired than left.Facial sensation intact to fine touch.  Facial motor strength is symmetric and tongue and uvula move  midline.  Motor exam:  Normal tone, muscle bulk and symmetric ,strength in all extremities.  Sensory:  Fine touch, pinprick and vibration were tested in all extremities.  The left foot has less v sensation to vibration, the middle ankle was "numb". Proprioception is  normal.  Coordination: Rapid alternating movements in the fingers/hands is normal.  Finger-to-nose maneuver normal without evidence of ataxia, dysmetria or tremor.  Gait and station: Patient walks without assistive device and is able unassisted to climb up to the exam table.  Strength within normal limits. Stance is stable and normal. Tandem gait is unfragmented.  Deep tendon reflexes: in the  upper and lower extremities are attenauted, symmetric and intact.  Babinski maneuver response is downgoing. Assessment:  After physical and neurologic examination, review of laboratory studies, imaging, neurophysiology testing and pre-existing records, assessment is   1) the patient's risk factors have remained he is considered overweight also not morbidly obese. His neck circumference is larger and by itself is a risk factor for obstructive sleep apnea. His Mallampati is grade 3. He reports that he uses his CPAP and that he notices a difference when he doesn't use it. His wife has reported that she snores and she not use him if he forgets to put on his CPAP. Mr Anastasia is snoring most loudly and supine and on his left side but not so much on his right- which remains unexplained. I would like for this patient who CPAP I could not interrogated today to use the machine for 30 days after [facility] has inspected it. I think that visit dates of 30 days of use I should be able to make a decision if he needs another sleep study at all or if he needs any kind of setting adjustments. At this time there is no evidence that he is sleepier than he was to 3 or 4 years ago. I would  like for him to get his supplies renewed including the interface. He  uses a heated coil. I also explained to Mr. Tonight but I doubt that he would be a good dental device candidate. He had heard about dental advancement therapies and my concern is that his original sleep study revealed REM sleep dependent apnea with desaturation in REM which usually are not controlled by a device that does not help this pressure to overcome the airway and bronchial barriers.He has nasal pillow and nasal mask,, switched form [facility].     The patient was advised of the nature of the diagnosed sleep disorder , the treatment options and risks for general a health and wellness arising from not treating the condition. Visit duration was 45 minutes.   Plan:  Treatment plan and additional workup : Obtain a 30 day auto set download form [facility], see where the 95% pressure is now, and if poor AHI results , a new in lab titration.      Asencion Partridge Karis Emig MD  09/11/2014

## 2014-09-11 NOTE — Patient Instructions (Signed)

## 2014-11-20 ENCOUNTER — Encounter: Payer: Self-pay | Admitting: Internal Medicine

## 2014-12-09 ENCOUNTER — Encounter: Payer: Self-pay | Admitting: Neurology

## 2014-12-12 ENCOUNTER — Ambulatory Visit (INDEPENDENT_AMBULATORY_CARE_PROVIDER_SITE_OTHER): Payer: Medicare Other | Admitting: Neurology

## 2014-12-12 ENCOUNTER — Encounter: Payer: Self-pay | Admitting: Neurology

## 2014-12-12 VITALS — BP 140/62 | HR 76 | Resp 20 | Ht 67.32 in | Wt 196.0 lb

## 2014-12-12 DIAGNOSIS — G3184 Mild cognitive impairment, so stated: Secondary | ICD-10-CM

## 2014-12-12 DIAGNOSIS — Z9989 Dependence on other enabling machines and devices: Secondary | ICD-10-CM

## 2014-12-12 DIAGNOSIS — E669 Obesity, unspecified: Secondary | ICD-10-CM | POA: Diagnosis not present

## 2014-12-12 DIAGNOSIS — G4733 Obstructive sleep apnea (adult) (pediatric): Secondary | ICD-10-CM

## 2014-12-12 HISTORY — DX: Mild cognitive impairment of uncertain or unknown etiology: G31.84

## 2014-12-12 NOTE — Progress Notes (Signed)
SLEEP MEDICINE CLINIC   Provider:  Larey Seat, M D  Referring Provider: Burnard Bunting, MD Primary Care Physician:  Geoffery Lyons, MD  Chief Complaint  Patient presents with  . Follow-up    cpap, rm 10, alone    HPI:  Derrick [name] is a 74 y.o. male , seen here as a referral from Dr. [name] for a re evaluation of sleep apnea and CPAP treatment.  I have seen this patient last time 4 years ago in an office visit. Originally referred by Dr. [name] then,  this patient came to me initially because his wife was concerned about his snoring and occasional pausing in his breath. The patient had reported that he had gained weight prior to being finally evaluated by the sleep study he had a history of hypertension, hearing loss, diabetes and a remote tobacco use history of about 40 pack years. His he had also reported nocturia at the time. The patient's sleep study took place on 1-30 06-2010. And revealed an AHI that off 24.1 per hour of sleep. He also had 11 central apneas but the vast majority were 118 obstructive apneas. There was not much of a positional component to his sleep but in rem sleep his AHI exacerbated 258.9 the patient's lowest oxygen saturation was 76% was 21 minutes of desaturation time and Toprol. She returned for a CPAP titration and was titrated to 12 cm water. In his first download he had a 90% reduction of apneas and reached an AHI of 2.5. Also he was happy with the CPAP results he was initially not liking it too much. Patient is an insulin pump where a and had also reported that he had taken Celebrex when I last saw him in 2012 and he was concerned that some of his lower extremity edema that he presented with may have been Celebrex related. [name] tells me today that he has used his CPAP machine and his current Epworth sleepiness score is endorsed at 10 points and his fatigue severity at 18 points. He does have a higher blood pressure and in his review of systems  endorsed snoring hearing loss, erectile dysfunction and aching muscles, also bilateral shoulder injuries rotator cuff injuries.  The patient endorsed a bed time, 10.30 PM and goes to sleep usually within less than 15 minutes. He will wake only once ( if at all ) at 2 AM  for a bathroom break , not longer 3-4 as before CPAP.  He wakes up at 6 AM spontaneously and doesn't usually get up right away. He watches his blood sugar and will have breakfast depending on his glucose levels. He attends the hope class [employer] helping older people exercise, and often eats after the exercise.  He will drink about 3-4 cups of coffee a day, at lunch and dinner he will drink caffeinated sodas. He does not drink iced tea. He regularly naps in the afternoon, and apical normally last about one hour and she usually does not use his CPAP for the nap time. He naps usually in a recliner.  His bed room is about 68 degrees fahrenheit, and he reports it is quiet and dark. He shares a bedroom with his wife, the couple lives alone they do not have pets at this time. The patient never had neck surgery. He had his tonsils removed in childhood, there has been no other facial or ENT surgery. The patient is currently on 4 substances to control his blood pressure. Lisinopril, HCTZ that, Norvasc,  labetalol. 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 He lives near [place].   12-12-14  Rv for this patient with OSA on a new CPAP. The patient reports today that he is very happy with this CPAP machine he is using it 100% of the time a download confirms 30 per 30 days out of 30 days of use 8 hours and 53 minutes on average 100% compliance. He is using an O2 sat between 5 and 15 cm water with 3 cm EPR. The 91st percentile of pressure is 11.6 cm water for this reason I would like to release the patient on an auto set machine instead of deciding on a set pressure.  His residual AHI is 4.0 which is sufficient  and actually the patient is satisfied with the machine and use. He also comments that his spouse appreciates the very quiet character of the machine. He is using a large nasal pillow, has kept his sleep habits.  The patient reports that he has to blow his nose in the morning and that he has quite a bit of mucus discharge. This is likely related to the humidifier settings. As long as his nose is not obstructed or congested I have no problem with him using the current settings. His Epworth sleepiness score is 5 points  and his fatigue severity score 15 points -  very good.    Review of Systems: Out of a complete 14 system review, the patient complains of only the following symptoms, and all other reviewed systems are negative.  In  his review of systems endorsed snoring hearing loss, erectile dysfunction and aching muscles, also bilateral shoulder injuries rotator cuff injuries.   Epworth score 5 from  10 , Fatigue severity score  15 form 18  , depression score  0 from 0   History   Social History  . Marital Status: Married    Spouse Name: [name]   . Number of Children: 2  . Years of Education: masters   Occupational History  .      Retired.   Social History Main Topics  . Smoking status: Former Smoker -- 0.25 packs/day for 15 years    Types: Cigarettes    Quit date: 06/23/1983  . Smokeless tobacco: Never Used  . Alcohol Use: No  . Drug Use: No  . Sexual Activity: Yes   Other Topics Concern  . Not on file   Social History Narrative   Patient lives at home with his wife [name]).   Retired.   Education Master's degree.   Right handed.   Caffeine three cups of coffee daily.    Family History  Problem Relation Age of Onset  . Colon cancer Neg Hx   . Heart disease Father   . High blood pressure Father     Past Medical History  Diagnosis Date  . Diabetes mellitus   . Hypertension   . Hypercholesteremia   . Macular degeneration   . Obstructive sleep apnea   .  Complication of anesthesia     "high blood sugar; he's been loopy last 36h since OR"  . Arthritis   . DKA, type 2   . Insulin pump in place   . Sleep apnea     cpap  . Cataract   . OSA on CPAP 09/11/2014  . Hearing loss   . MCI (mild cognitive impairment) with memory loss 12/12/2014    Past Surgical History  Procedure Laterality Date  .  Shoulder arthroscopy Right 08/24/11    "frozen shoulder; RCR w/labrum tear; arthritis scraping; bone spurs"  . Tonsillectomy  1949    "in childhood"  . Shoulder arthroscopy Left ~ 2006    Current Outpatient Prescriptions  Medication Sig Dispense Refill  . amLODipine (NORVASC) 10 MG tablet Take 10 mg by mouth daily.    Marland Kitchen aspirin EC 81 MG tablet Take 81 mg by mouth daily.    . Insulin Human (INSULIN PUMP) SOLN Inject into the skin.    Marland Kitchen labetalol (NORMODYNE) 100 MG tablet Take 100 mg by mouth daily.    Marland Kitchen lisinopril-hydrochlorothiazide (PRINZIDE,ZESTORETIC) 20-12.5 MG per tablet Take 1 tablet by mouth daily.    . Multiple Vitamins-Minerals (ICAPS AREDS FORMULA PO) Take 1 tablet by mouth 2 (two) times daily.    . rosuvastatin (CRESTOR) 20 MG tablet Take 20 mg by mouth daily.    . insulin aspart (NOVOLOG) 100 UNIT/ML injection Inject 0-15 Units into the skin 3 (three) times daily with meals. Check blood sugars every 4 hours while awake and use the Humalog pen: Greater than 350 give 12 units, 251-350 give 8 units, 151-250 give 4 units. 1 pen 5  . insulin glargine (LANTUS) 100 UNIT/ML injection Inject 25 Units into the skin daily. 10 mL 5   No current facility-administered medications for this visit.    Allergies as of 12/12/2014  . (No Known Allergies)    Vitals: BP 140/62 mmHg  Pulse 76  Resp 20  Ht 5' 7.32" (1.71 m)  Wt 196 lb (88.905 kg)  BMI 30.40 kg/m2 Last Weight:  Wt Readings from Last 1 Encounters:  12/12/14 196 lb (88.905 kg)       Last Height:   Ht Readings from Last 1 Encounters:  12/12/14 5' 7.32" (1.71 m)    Physical  exam:  General: The patient is awake, alert and appears not in acute distress. The patient is well groomed. Head: Normocephalic, atraumatic. Neck is supple. Mallampati 3,  neck circumference: 18 . Nasal airflow unrestricted , TMJ is  Not  evident . Retrognathia is mild .  Cardiovascular:  Regular rate and rhythm, without  murmurs or carotid bruit, and without distended neck veins. Respiratory: Lungs are clear to auscultation. Skin:  Without evidence of edema, or rash Trunk: BMI is  elevated and patient  has normal posture.  Neurologic exam : The patient is awake and alert, oriented to place and time.   Memory subjective  described as intact.  There is a normal attention span & concentration ability. Speech is fluent without dysarthria, dysphonia or aphasia. Mood and affect are appropriate.  Cranial nerves: Pupils are equal and extraocular movements  in vertical and horizontal planes intact and without nystagmus. Visual fields by finger perimetry are intact. Hearing to finger rub right more impaired than left.Facial sensation intact to fine touch.  Facial motor strength is symmetric and tongue and uvula move midline.  Motor exam:  Normal tone, muscle bulk and symmetric ,strength in all extremities.  Deep tendon reflexes: in the  upper and lower extremities are attenauted, symmetric and intact.  Babinski maneuver response is downgoing. Assessment:  After physical and neurologic examination, review of laboratory studies, imaging, neurophysiology testing and pre-existing records, assessment is   1) we did not repeat a sleep study but instead I placed Mr. Orlene Och on a 30 day auto titration. The outer titration documented excellent compliance low air leaks with the current nasal pillow mask in large size. The setting is between 5  cm minimum pressure and 15 cm maximum pressure with 3 cm pressure relief. The residual AHI is satisfying at 4.0 I would like for the patient to remain on this machine if  possible. This is currently alone her machine. I would like for him to continue using an AutoSet machine if this is by insurance for any wheeze not possible I would reset his previous machine to a setting of 12 cm water pressure. I will [facility] a request to [facility].   Sincerely,  Larey Seat M.D.   The patient was advised of the nature of the diagnosed sleep disorder , the treatment options and risks for general a health and wellness arising from not treating the condition. Visit duration was 20 minutes.       Asencion Partridge Kemba Hoppes MD  12/12/2014

## 2014-12-31 DIAGNOSIS — E669 Obesity, unspecified: Secondary | ICD-10-CM | POA: Diagnosis not present

## 2014-12-31 DIAGNOSIS — E1165 Type 2 diabetes mellitus with hyperglycemia: Secondary | ICD-10-CM | POA: Diagnosis not present

## 2014-12-31 DIAGNOSIS — I129 Hypertensive chronic kidney disease with stage 1 through stage 4 chronic kidney disease, or unspecified chronic kidney disease: Secondary | ICD-10-CM | POA: Diagnosis not present

## 2014-12-31 DIAGNOSIS — I10 Essential (primary) hypertension: Secondary | ICD-10-CM | POA: Diagnosis not present

## 2014-12-31 DIAGNOSIS — N182 Chronic kidney disease, stage 2 (mild): Secondary | ICD-10-CM | POA: Diagnosis not present

## 2014-12-31 DIAGNOSIS — Z794 Long term (current) use of insulin: Secondary | ICD-10-CM | POA: Diagnosis not present

## 2014-12-31 DIAGNOSIS — Z6829 Body mass index (BMI) 29.0-29.9, adult: Secondary | ICD-10-CM | POA: Diagnosis not present

## 2014-12-31 DIAGNOSIS — E785 Hyperlipidemia, unspecified: Secondary | ICD-10-CM | POA: Diagnosis not present

## 2014-12-31 DIAGNOSIS — E1129 Type 2 diabetes mellitus with other diabetic kidney complication: Secondary | ICD-10-CM | POA: Diagnosis not present

## 2015-01-08 DIAGNOSIS — L602 Onychogryphosis: Secondary | ICD-10-CM | POA: Diagnosis not present

## 2015-01-08 DIAGNOSIS — E119 Type 2 diabetes mellitus without complications: Secondary | ICD-10-CM | POA: Diagnosis not present

## 2015-01-08 DIAGNOSIS — B353 Tinea pedis: Secondary | ICD-10-CM | POA: Diagnosis not present

## 2015-02-12 DIAGNOSIS — H3532 Exudative age-related macular degeneration: Secondary | ICD-10-CM | POA: Diagnosis not present

## 2015-02-12 DIAGNOSIS — E11339 Type 2 diabetes mellitus with moderate nonproliferative diabetic retinopathy without macular edema: Secondary | ICD-10-CM | POA: Diagnosis not present

## 2015-02-12 DIAGNOSIS — H3531 Nonexudative age-related macular degeneration: Secondary | ICD-10-CM | POA: Diagnosis not present

## 2015-04-10 DIAGNOSIS — L602 Onychogryphosis: Secondary | ICD-10-CM | POA: Diagnosis not present

## 2015-05-01 DIAGNOSIS — Z794 Long term (current) use of insulin: Secondary | ICD-10-CM | POA: Diagnosis not present

## 2015-05-01 DIAGNOSIS — I129 Hypertensive chronic kidney disease with stage 1 through stage 4 chronic kidney disease, or unspecified chronic kidney disease: Secondary | ICD-10-CM | POA: Diagnosis not present

## 2015-05-01 DIAGNOSIS — Z23 Encounter for immunization: Secondary | ICD-10-CM | POA: Diagnosis not present

## 2015-05-01 DIAGNOSIS — Z6831 Body mass index (BMI) 31.0-31.9, adult: Secondary | ICD-10-CM | POA: Diagnosis not present

## 2015-05-01 DIAGNOSIS — I10 Essential (primary) hypertension: Secondary | ICD-10-CM | POA: Diagnosis not present

## 2015-05-01 DIAGNOSIS — E784 Other hyperlipidemia: Secondary | ICD-10-CM | POA: Diagnosis not present

## 2015-05-01 DIAGNOSIS — E669 Obesity, unspecified: Secondary | ICD-10-CM | POA: Diagnosis not present

## 2015-05-01 DIAGNOSIS — N182 Chronic kidney disease, stage 2 (mild): Secondary | ICD-10-CM | POA: Diagnosis not present

## 2015-05-01 DIAGNOSIS — E1129 Type 2 diabetes mellitus with other diabetic kidney complication: Secondary | ICD-10-CM | POA: Diagnosis not present

## 2015-06-19 ENCOUNTER — Ambulatory Visit: Payer: Medicare Other | Admitting: Diagnostic Neuroimaging

## 2015-06-25 ENCOUNTER — Ambulatory Visit: Payer: Medicare Other | Admitting: Neurology

## 2015-07-15 DIAGNOSIS — H25042 Posterior subcapsular polar age-related cataract, left eye: Secondary | ICD-10-CM | POA: Diagnosis not present

## 2015-07-15 DIAGNOSIS — H524 Presbyopia: Secondary | ICD-10-CM | POA: Diagnosis not present

## 2015-07-15 DIAGNOSIS — E113293 Type 2 diabetes mellitus with mild nonproliferative diabetic retinopathy without macular edema, bilateral: Secondary | ICD-10-CM | POA: Diagnosis not present

## 2015-07-15 DIAGNOSIS — H40013 Open angle with borderline findings, low risk, bilateral: Secondary | ICD-10-CM | POA: Diagnosis not present

## 2015-07-22 DIAGNOSIS — Z6830 Body mass index (BMI) 30.0-30.9, adult: Secondary | ICD-10-CM | POA: Diagnosis not present

## 2015-07-22 DIAGNOSIS — E1129 Type 2 diabetes mellitus with other diabetic kidney complication: Secondary | ICD-10-CM | POA: Diagnosis not present

## 2015-07-22 DIAGNOSIS — R1010 Upper abdominal pain, unspecified: Secondary | ICD-10-CM | POA: Diagnosis not present

## 2015-07-23 ENCOUNTER — Encounter: Payer: Self-pay | Admitting: Neurology

## 2015-07-23 ENCOUNTER — Ambulatory Visit (INDEPENDENT_AMBULATORY_CARE_PROVIDER_SITE_OTHER): Payer: Medicare Other | Admitting: Neurology

## 2015-07-23 VITALS — BP 130/62 | HR 82 | Resp 20 | Ht 68.0 in | Wt 186.0 lb

## 2015-07-23 DIAGNOSIS — Z1212 Encounter for screening for malignant neoplasm of rectum: Secondary | ICD-10-CM | POA: Diagnosis not present

## 2015-07-23 DIAGNOSIS — G4733 Obstructive sleep apnea (adult) (pediatric): Secondary | ICD-10-CM | POA: Diagnosis not present

## 2015-07-23 DIAGNOSIS — K824 Cholesterolosis of gallbladder: Secondary | ICD-10-CM | POA: Diagnosis not present

## 2015-07-23 DIAGNOSIS — Z9989 Dependence on other enabling machines and devices: Principal | ICD-10-CM

## 2015-07-23 NOTE — Progress Notes (Signed)
SLEEP MEDICINE CLINIC   Provider:  Larey Seat, M D  Referring Provider: Burnard Bunting, MD Primary Care Physician:  Geoffery Lyons, MD  Chief Complaint  Patient presents with  . Follow-up    cpap going well, pt has another appt after this, rm 10, alone    HPI:  Derrick [name] is a 75 y.o. male , seen here as a referral from Dr. [name] for a re evaluation of sleep apnea and CPAP treatment.  I have seen this patient last time 4 years ago in an office visit. Originally referred by Dr. [name] then,  this patient came to me initially because his wife was concerned about his snoring and occasional pausing in his breath. The patient had reported that he had gained weight prior to being finally evaluated by the sleep study he had a history of hypertension, hearing loss, diabetes and a remote tobacco use history of about 40 pack years. His he had also reported nocturia at the time. The patient's sleep study took place on 1-30 06-2010. And revealed an AHI that off 24.1 per hour of sleep. He also had 11 central apneas but the vast majority were 118 obstructive apneas. There was not much of a positional component to his sleep but in rem sleep his AHI exacerbated 258.9 the patient's lowest oxygen saturation was 76% was 21 minutes of desaturation time and Toprol. She returned for a CPAP titration and was titrated to 12 cm water. In his first download he had a 90% reduction of apneas and reached an AHI of 2.5. Also he was happy with the CPAP results he was initially not liking it too much. Patient is an insulin pump where a and had also reported that he had taken Celebrex when I last saw him in 2012 and he was concerned that some of his lower extremity edema that he presented with may have been Celebrex related. [name] tells me today that he has used his CPAP machine and his current Epworth sleepiness score is endorsed at 10 points and his fatigue severity at 18 points. He does have a higher  blood pressure and in his review of systems endorsed snoring hearing loss, erectile dysfunction and aching muscles, also bilateral shoulder injuries rotator cuff injuries.  The patient endorsed a bed time, 10.30 PM and goes to sleep usually within less than 15 minutes. He will wake only once ( if at all ) at 2 AM  for a bathroom break , not longer 3-4 as before CPAP.  He wakes up at 6 AM spontaneously and doesn't usually get up right away. He watches his blood sugar and will have breakfast depending on his glucose levels. He attends the hope class [employer] helping older people exercise, and often eats after the exercise.  He will drink about 3-4 cups of coffee a day, at lunch and dinner he will drink caffeinated sodas. He does not drink iced tea. He regularly naps in the afternoon, and apical normally last about one hour and she usually does not use his CPAP for the nap time. He naps usually in a recliner.His bed room is about 68 degrees fahrenheit, and he reports it is quiet and dark. He shares a bedroom with his wife, the couple lives alone they do not have pets at this time. The patient never had neck surgery. He had his tonsils removed in childhood, there has been no other facial or ENT surgery. The patient is currently on 4 substances to control his  blood pressure. Lisinopril, HCTZ that, Norvasc, labetalol. 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   12-12-14  Rv for this patient with OSA on a new CPAP. The patient reports today that he is very happy with this CPAP machine he is using it 100% of the time a download confirms 30 per 30 days out of 30 days of use 8 hours and 53 minutes on average 100% compliance. He is using an O2 sat between 5 and 15 cm water with 3 cm EPR. The 91st percentile of pressure is 11.6 cm water for this reason I would like to release the patient on an auto set machine instead of deciding on a set pressure.  His  residual AHI is 4.0 which is sufficient and actually the patient is satisfied with the machine and use. He also comments that his spouse appreciates the very quiet character of the machine. He is using a large nasal pillow, has kept his sleep habits.  The patient reports that he has to blow his nose in the morning and that he has quite a bit of mucus discharge. This is likely related to the humidifier settings. As long as his nose is not obstructed or congested I have no problem with him using the current settings. His Epworth sleepiness score is 5 points  and his fatigue severity score 15 points -  very good.  07-23-15, With the clivus here for his regular yearly compliance visit with CPAP. He over the last 30 days used his machine 100% of the time and over 4 hours 400% of the time. Average user time 9 hours and 23 minutes. AutoSet between 5 and 15 cm water with 3 cm EPR, 91st percentile 10.6 cm water. AHI 2.5. No changes or new settings are necessary.  Fatigue severity score was endorsed at 25 total score for the Epworth Sleepiness Scale was 2 points.  His wife and daughter have voiced concerns about his cognition, the couple moved in12-2016 to  [facility]. He started Aricept at 5 mg daily just this month.   I recommended melatonin for PRN insomnia. He uses aleve pm.      Review of Systems: Out of a complete 14 system review, the patient complains of only the following symptoms, and all other reviewed systems are negative.  In  his review of systems endorsed snoring hearing loss, erectile dysfunction and aching muscles, also bilateral shoulder injuries rotator cuff injuries.   Epworth score  2 from  10 , Fatigue severity score   25 from 15   , depression score  0 from 0   Social History   Social History  . Marital Status: Married    Spouse Name: Derrick [name]   . Number of Children: 2  . Years of Education: masters   Occupational History  .      Retired.   Social History Main  Topics  . Smoking status: Former Smoker -- 0.25 packs/day for 15 years    Types: Cigarettes    Quit date: 06/23/1983  . Smokeless tobacco: Never Used  . Alcohol Use: No  . Drug Use: No  . Sexual Activity: Yes   Other Topics Concern  . Not on file   Social History Narrative   Patient lives at home with his wife Derrick [name]).   Retired.   Education Master's degree.   Right handed.   Caffeine three cups of coffee daily.    Family History  Problem Relation Age of Onset  . Colon cancer Neg Hx   . Heart disease Father   . High blood pressure Father     Past Medical History  Diagnosis Date  . Diabetes mellitus   . Hypertension   . Hypercholesteremia   . Macular degeneration   . Obstructive sleep apnea   . Complication of anesthesia     "high blood sugar; he's been loopy last 36h since OR"  . Arthritis   . DKA, type 2 ([place])   . Insulin pump in place   . Sleep apnea     cpap  . Cataract   . OSA on CPAP 09/11/2014  . Hearing loss   . MCI (mild cognitive impairment) with memory loss 12/12/2014    Past Surgical History  Procedure Laterality Date  . Shoulder arthroscopy Right 08/24/11    "frozen shoulder; RCR w/labrum tear; arthritis scraping; bone spurs"  . Tonsillectomy  1949    "in childhood"  . Shoulder arthroscopy Left ~ 2006    Current Outpatient Prescriptions  Medication Sig Dispense Refill  . amLODipine (NORVASC) 10 MG tablet Take 10 mg by mouth daily.    Marland Kitchen aspirin EC 81 MG tablet Take 81 mg by mouth daily.    Marland Kitchen donepezil (ARICEPT) 5 MG tablet Take 5 mg by mouth daily.  11  . Insulin Human (INSULIN PUMP) SOLN Inject into the skin.    Marland Kitchen labetalol (NORMODYNE) 200 MG tablet Take 200 mg by mouth 2 (two) times daily.  7  . lisinopril-hydrochlorothiazide (PRINZIDE,ZESTORETIC) 20-12.5 MG per tablet Take 1 tablet by mouth daily.    . Multiple Vitamins-Minerals (ICAPS AREDS FORMULA PO) Take 1 tablet by mouth 2 (two) times daily.    . rosuvastatin (CRESTOR) 20 MG tablet Take  20 mg by mouth daily.    . insulin aspart (NOVOLOG) 100 UNIT/ML injection Inject 0-15 Units into the skin 3 (three) times daily with meals. Check blood sugars every 4 hours while awake and use the Humalog pen: Greater than 350 give 12 units, 251-350 give 8 units, 151-250 give 4 units. 1 pen 5  . insulin glargine (LANTUS) 100 UNIT/ML injection Inject 25 Units into the skin daily. 10 mL 5   No current facility-administered medications for this visit.    Allergies as of 07/23/2015  . (No Known Allergies)    Vitals: BP 130/62 mmHg  Pulse 82  Resp 20  Ht 5\' 8"  (1.727 m)  Wt 186 lb (84.369 kg)  BMI 28.29 kg/m2 Last Weight:  Wt Readings from Last 1 Encounters:  07/23/15 186 lb (84.369 kg)       Last Height:   Ht Readings from Last 1 Encounters:  07/23/15 5\' 8"  (1.727 m)    Physical exam:  General: The patient is awake, alert and appears not in acute distress. The patient is well groomed. Head: Normocephalic, atraumatic. Neck is supple. Mallampati 3,  neck circumference: 18 . Nasal airflow unrestricted , TMJ is not evident. Retrognathia is mild .  Cardiovascular:  Regular rate and rhythm, without murmurs or carotid bruit, and without distended neck veins. Respiratory: Lungs are clear to auscultation. Skin:  Without evidence of edema, or rash Trunk: BMI is elevated and patient  has normal posture.  Neurologic exam : The patient is awake and alert, oriented to place and time.   Memory subjective  described as intact.  There is a normal attention span & concentration ability. Speech is fluent without dysarthria, dysphonia or aphasia. Mood and affect  are appropriate.  Cranial nerves: Pupils are equal and extraocular movements  in vertical and horizontal planes intact and without nystagmus. Visual fields by finger perimetry are intact. Hearing to finger rub right more impaired than left.Facial sensation intact to fine touch.  Facial motor strength is symmetric and tongue and uvula move  midline. Motor exam:  Normal tone, muscle bulk and symmetric ,strength in all extremities. Deep tendon reflexes: in the  upper and lower extremities are attenauted, symmetric and intact.  Babinski maneuver response is downgoing. Assessment:  After physical and neurologic examination, review of laboratory studies, imaging, neurophysiology testing and pre-existing records, assessment is   1)  The CPAPs auto setting is between 5 cm minimum pressure and 15 cm maximum pressure with 3 cm pressure relief.  The residual AHI is satisfying at 3.4 .  I would like for the patient to remain on this machine if possible.   would like for him to continue using an AutoSet machine, I will send a note of compliance  to [facility].  Sincerely,  Larey Seat M.D.    Cc Dr . Ferne Reus   The patient was advised of the nature of the diagnosed sleep disorder , the treatment options and risks for general a health and wellness arising from not treating the condition. Visit duration was 20 minutes.       Asencion Partridge Tryston Gilliam MD  07/23/2015

## 2015-07-25 ENCOUNTER — Other Ambulatory Visit (HOSPITAL_COMMUNITY): Payer: Self-pay | Admitting: Internal Medicine

## 2015-07-25 DIAGNOSIS — R109 Unspecified abdominal pain: Secondary | ICD-10-CM

## 2015-08-01 ENCOUNTER — Encounter (HOSPITAL_COMMUNITY)
Admission: RE | Admit: 2015-08-01 | Discharge: 2015-08-01 | Disposition: A | Discharge status: Home / Self Care | Payer: Medicare Other | Source: Ambulatory Visit | Attending: Internal Medicine | Admitting: Internal Medicine

## 2015-08-01 ENCOUNTER — Encounter (HOSPITAL_COMMUNITY): Payer: Self-pay

## 2015-08-01 DIAGNOSIS — R109 Unspecified abdominal pain: Secondary | ICD-10-CM

## 2015-08-01 MED ORDER — TECHNETIUM TC 99M MEBROFENIN IV KIT
5.0000 | PACK | Freq: Once | INTRAVENOUS | Status: AC | PRN
  Administered 2015-08-01: 5 via INTRAVENOUS

## 2015-08-01 MED ORDER — SINCALIDE 5 MCG IJ SOLR
0.0200 ug/kg | Freq: Once | INTRAMUSCULAR | Status: AC
  Administered 2015-08-01: 1.7 ug via INTRAVENOUS

## 2015-08-08 DIAGNOSIS — K828 Other specified diseases of gallbladder: Secondary | ICD-10-CM | POA: Diagnosis not present

## 2015-08-13 DIAGNOSIS — H353212 Exudative age-related macular degeneration, right eye, with inactive choroidal neovascularization: Secondary | ICD-10-CM | POA: Diagnosis not present

## 2015-08-13 DIAGNOSIS — H353122 Nonexudative age-related macular degeneration, left eye, intermediate dry stage: Secondary | ICD-10-CM | POA: Diagnosis not present

## 2015-08-13 DIAGNOSIS — E113393 Type 2 diabetes mellitus with moderate nonproliferative diabetic retinopathy without macular edema, bilateral: Secondary | ICD-10-CM | POA: Diagnosis not present

## 2015-08-14 DIAGNOSIS — E1129 Type 2 diabetes mellitus with other diabetic kidney complication: Secondary | ICD-10-CM | POA: Diagnosis not present

## 2015-08-14 DIAGNOSIS — Z125 Encounter for screening for malignant neoplasm of prostate: Secondary | ICD-10-CM | POA: Diagnosis not present

## 2015-08-14 DIAGNOSIS — R8290 Unspecified abnormal findings in urine: Secondary | ICD-10-CM | POA: Diagnosis not present

## 2015-08-14 DIAGNOSIS — N390 Urinary tract infection, site not specified: Secondary | ICD-10-CM | POA: Diagnosis not present

## 2015-08-14 DIAGNOSIS — E784 Other hyperlipidemia: Secondary | ICD-10-CM | POA: Diagnosis not present

## 2015-08-21 DIAGNOSIS — E1129 Type 2 diabetes mellitus with other diabetic kidney complication: Secondary | ICD-10-CM | POA: Diagnosis not present

## 2015-08-21 DIAGNOSIS — E784 Other hyperlipidemia: Secondary | ICD-10-CM | POA: Diagnosis not present

## 2015-08-21 DIAGNOSIS — Z794 Long term (current) use of insulin: Secondary | ICD-10-CM | POA: Diagnosis not present

## 2015-08-21 DIAGNOSIS — N182 Chronic kidney disease, stage 2 (mild): Secondary | ICD-10-CM | POA: Diagnosis not present

## 2015-08-21 DIAGNOSIS — I10 Essential (primary) hypertension: Secondary | ICD-10-CM | POA: Diagnosis not present

## 2015-08-21 DIAGNOSIS — Z0000 Encounter for general adult medical examination without abnormal findings: Secondary | ICD-10-CM | POA: Diagnosis not present

## 2015-08-21 DIAGNOSIS — Z23 Encounter for immunization: Secondary | ICD-10-CM | POA: Diagnosis not present

## 2015-08-21 DIAGNOSIS — Z6831 Body mass index (BMI) 31.0-31.9, adult: Secondary | ICD-10-CM | POA: Diagnosis not present

## 2015-08-21 DIAGNOSIS — K829 Disease of gallbladder, unspecified: Secondary | ICD-10-CM | POA: Diagnosis not present

## 2015-08-21 DIAGNOSIS — Z1389 Encounter for screening for other disorder: Secondary | ICD-10-CM | POA: Diagnosis not present

## 2015-08-21 DIAGNOSIS — I129 Hypertensive chronic kidney disease with stage 1 through stage 4 chronic kidney disease, or unspecified chronic kidney disease: Secondary | ICD-10-CM | POA: Diagnosis not present

## 2015-08-21 DIAGNOSIS — E668 Other obesity: Secondary | ICD-10-CM | POA: Diagnosis not present

## 2015-09-02 DIAGNOSIS — E161 Other hypoglycemia: Secondary | ICD-10-CM | POA: Diagnosis not present

## 2015-09-10 DIAGNOSIS — Z1212 Encounter for screening for malignant neoplasm of rectum: Secondary | ICD-10-CM | POA: Diagnosis not present

## 2015-09-23 DIAGNOSIS — H532 Diplopia: Secondary | ICD-10-CM | POA: Diagnosis not present

## 2015-09-25 DIAGNOSIS — H532 Diplopia: Secondary | ICD-10-CM | POA: Diagnosis not present

## 2015-10-17 DIAGNOSIS — H353212 Exudative age-related macular degeneration, right eye, with inactive choroidal neovascularization: Secondary | ICD-10-CM | POA: Diagnosis not present

## 2015-10-25 ENCOUNTER — Encounter (HOSPITAL_COMMUNITY): Payer: Self-pay | Admitting: *Deleted

## 2015-10-25 ENCOUNTER — Ambulatory Visit: Payer: Self-pay | Admitting: General Surgery

## 2015-10-25 NOTE — Progress Notes (Signed)
LOV- Dr [name]- 08/21/2015 on chart  HGBA1C- done 09/11/15- 8.0 on chart in Dr [name] note EKG-08/21/15 on chart  CXR- 08/21/2015 on chart

## 2015-10-25 NOTE — H&P (Signed)
Derrick [name]. Derrick [name] Location: [facility] Patient #: [id number] DOB: [birth year] Married / Language: English / Race: White Male   History of Present Illness  Patient words: New-gallbladder.  The patient is a 75 year old male who presents for evaluation of gallbladder disease. He is referred by Dr [name] for evaluation for possible gallbladder disease. He reports 2 episodes last month occurring at night in which he describes he had a crawling sensation from his epigastric area down to his umbilical region. He states that it happened the following night after the initial night. He had some dark stool. He denies any nausea, vomiting, right upper quadrant or shoulder pain. He denies any jaundice. He denies any significant NSAID use. He denies any sharp pain. He ended up seeing his primary care physician who ordered an ultrasound. It was read as cholesterol crystals and a attentional small polyp in the gallbladder neck. He states it was more of a discomfort and not a true pain. It did interfere with his sleep when it occurred. He underwent a nuclear medicine scan which showed normal tracer extraction from the blood stream indicating normal hepatocellular function. There is normal excretion of tracer into the biliary tree. The gallbladder had not been visualized at one hour. At 62 minutes there was tracer demonstrated within the gallbladder. He did not experience any symptoms during CCK administration. Gallbladder ejection fraction could not be calculated  He denies any chest pain, chest pressure, shortness of breath, dyspnea on exertion, orthopnea, paroxysmal nocturnal dyspnea  He called back in stating he was having more frequent episodes and wanted to proceed with surgery.  Problem List/Past Medical  BILIARY DYSKINESIA (K82.8)  Other Problems  Chronic Obstructive Lung Disease Diabetes Mellitus High blood pressure Hypercholesterolemia Sleep Apnea  Past  Surgical History Shoulder Surgery Bilateral. Tonsillectomy  Diagnostic Studies History Colonoscopy 1-5 years ago  Allergies  No Known Drug Allergies02/16/2017  Medication History AmLODIPine Besylate (10MG  Tablet, Oral) Active. Donepezil HCl (5MG  Tablet, Oral) Active. Labetalol HCl (200MG  Tablet, Oral) Active. Lisinopril-Hydrochlorothiazide (20-12.5MG  Tablet, Oral) Active. NovoLOG (100UNIT/ML Solution, Subcutaneous) Active. Rosuvastatin Calcium (20MG  Tablet, Oral) Active. Pantoprazole Sodium (40MG  Tablet DR, Oral) Active. Lantus (100UNIT/ML Solution, Subcutaneous) Active. Medications Reconciled  Social History  Caffeine use Carbonated beverages, Coffee. No alcohol use No drug use Tobacco use Former smoker.  Family History Heart Disease Father.    Review of Systems General Present- Fatigue and Night Sweats. Not Present- Appetite Loss, Chills, Fever, Weight Gain and Weight Loss. Skin Not Present- Change in Wart/Mole, Dryness, Hives, Jaundice, New Lesions, Non-Healing Wounds, Rash and Ulcer. HEENT Present- Hearing Loss and Wears glasses/contact lenses. Not Present- Earache, Hoarseness, Nose Bleed, Oral Ulcers, Ringing in the Ears, Seasonal Allergies, Sinus Pain, Sore Throat, Visual Disturbances and Yellow Eyes. Respiratory Not Present- Bloody sputum, Chronic Cough, Difficulty Breathing, Snoring and Wheezing. Breast Not Present- Breast Mass, Breast Pain, Nipple Discharge and Skin Changes. Cardiovascular Not Present- Chest Pain, Difficulty Breathing Lying Down, Leg Cramps, Palpitations, Rapid Heart Rate, Shortness of Breath and Swelling of Extremities. Gastrointestinal Not Present- Abdominal Pain, Bloating, Bloody Stool, Change in Bowel Habits, Chronic diarrhea, Constipation, Difficulty Swallowing, Excessive gas, Gets full quickly at meals, Hemorrhoids, Indigestion, Nausea, Rectal Pain and Vomiting. Male Genitourinary Present- Impotence. Not Present- Blood in  Urine, Change in Urinary Stream, Frequency, Nocturia, Painful Urination, Urgency and Urine Leakage. Musculoskeletal Present- Back Pain. Not Present- Joint Pain, Joint Stiffness, Muscle Pain, Muscle Weakness and Swelling of Extremities. Neurological Present- Decreased Memory. Not Present- Fainting, Headaches, Numbness, Seizures, Tingling, Tremor,  Trouble walking and Weakness. Psychiatric Present- Change in Sleep Pattern. Not Present- Anxiety, Bipolar, Depression, Fearful and Frequent crying. Endocrine Not Present- Cold Intolerance, Excessive Hunger, Hair Changes, Heat Intolerance, Hot flashes and New Diabetes. Hematology Not Present- Easy Bruising, Excessive bleeding, Gland problems, HIV and Persistent Infections.  Vitals Weight: 190 lb Height: 68in Body Surface Area: 2 m Body Mass Index: 28.89 kg/m  Temp.: 97.56F(Oral)  Pulse: 80 (Regular)  BP: 110/68 (Sitting, Left Arm, Standard)       Physical Exam  General Mental Status-Alert. General Appearance-Consistent with stated age. Hydration-Well hydrated. Voice-Normal.  Head and Neck Head-normocephalic, atraumatic with no lesions or palpable masses. Trachea-midline. Thyroid Gland Characteristics - normal size and consistency.  Eye Eyeball - Bilateral-Extraocular movements intact. Sclera/Conjunctiva - Bilateral-No scleral icterus.  Chest and Lung Exam Chest and lung exam reveals -quiet, even and easy respiratory effort with no use of accessory muscles and on auscultation, normal breath sounds, no adventitious sounds and normal vocal resonance. Inspection Chest Wall - Normal. Back - normal.  Breast - Did not examine.  Cardiovascular Cardiovascular examination reveals -normal heart sounds, regular rate and rhythm with no murmurs and normal pedal pulses bilaterally.  Abdomen Inspection Inspection of the abdomen reveals - No Hernias. Skin - Scar - no surgical  scars. Palpation/Percussion Palpation and Percussion of the abdomen reveal - Soft, Non Tender, No Rebound tenderness, No Rigidity (guarding) and No hepatosplenomegaly. Auscultation Auscultation of the abdomen reveals - Bowel sounds normal.  Peripheral Vascular Upper Extremity Palpation - Pulses bilaterally normal.  Neurologic Neurologic evaluation reveals -alert and oriented x 3 with no impairment of recent or remote memory. Mental Status-Normal.  Neuropsychiatric The patient's mood and affect are described as -normal. Judgment and Insight-insight is appropriate concerning matters relevant to self.  Musculoskeletal Normal Exam - Left-Upper Extremity Strength Normal and Lower Extremity Strength Normal. Normal Exam - Right-Upper Extremity Strength Normal and Lower Extremity Strength Normal.  Lymphatic Head & Neck  General Head & Neck Lymphatics: Bilateral - Description - Normal. Axillary - Did not examine. Femoral & Inguinal - Did not examine.    Assessment & Plan BILIARY DYSKINESIA (K82.8) Impression: His symptoms are not classic for biliary dyskinesia. His main complaint is a sensation of crawling from his umbilicus to epigastric region. He did have some cholesterol deposits seen on ultrasound. However a component of his symptoms may be attributed to his gallbladder. He does have long-standing diabetes and there could be some underlying gastroparesis. I had a very extensive prolonged discussion with him and his wife regarding gallbladder disease and surgical management.  The patient was given Neurosurgeon. We discussed operative management. We discussed the signs & symptoms of acute cholecystitis  I discussed laparoscopic cholecystectomy with IOC in detail. The patient was given educational material as well as diagrams detailing the procedure. We discussed the risks and benefits of a laparoscopic cholecystectomy including, but not limited to bleeding,  infection, injury to surrounding structures such as the intestine or liver, bile leak, retained gallstones, need to convert to an open procedure, prolonged diarrhea, blood clots such as DVT, common bile duct injury, anesthesia risks, and possible need for additional procedures. We discussed the typical post-operative recovery course. I explained that the likelihood of improvement of their symptoms is fair to good  I told him I could not guarantee at this point that with cholecystectomy his symptoms would not recur. We discussed the possibility that a component of his symptoms may be due to perhaps gastroparesis given his long-standing diabetes. We  discussed a gastric emptying study. We discussed the pros and cons of cholecystectomy to see if it would eliminate his symptoms and get it out of the differential versus doing additional testing. He is going to discuss it with his wife over the next couple days and let us know how he would like to proceed. He is to call us and let us know whether or not he would like to proceed with gastric emptying study or cholecystectomy or continued observ Current Plans Patient or caregiver to follow up by phone with update next week  let me know how you would like to proceed - surgery, gastric emptying study, continued observation  please call with questions  Pt Education - Pamphlet Given - Laparoscopic Gallbladder Surgery: discussed with patient and provided information.  Leighton Ruff. Redmond Pulling, MD, FACS General, Bariatric, & Minimally Invasive Surgery [facility], [place]

## 2015-10-25 NOTE — Progress Notes (Signed)
Spoke with anesthesia ( Dr Montez Hageman) and he made aware patient has Insulin Pump.  Per Dr Montez Hageman Insulin Pump is to stay on until patient arrives to Short Stay then blood glucose will be checked.  Called patient at home and spoke with him and patient aware and voiced understanding that Insulin Pump is to stay on and his glucose will be checked once he arrives in Short Stay.

## 2015-10-25 NOTE — Progress Notes (Signed)
Page diabetes Coordinator to notify her of Insulin Pump on patient.  Awaiting return call.   Requested most recent EKG, LOV , most recent HGA1C, and CXR from office of Dr [name].

## 2015-10-25 NOTE — Progress Notes (Signed)
Diabetres coordinator returned call and aware patient date, arrival time and time of surgery.  Lelan Pons Stated Suanne Marker would be here on Monday 10/28/2015.  Lelan Pons stated that for a surgery less than 2 hours patient leaves insulin pump on.  I told her I would let anesthesia be aware of Insulin Pump and see if they had any further instructions.  Diabetes Coordinator aware patient is to bring own insulin pump supplies from home.  Patient is also bringing cpap mask and tubing from home.,

## 2015-10-28 ENCOUNTER — Encounter (HOSPITAL_COMMUNITY)
Admission: RE | Disposition: A | Discharge status: Home / Self Care | Payer: Self-pay | Source: Ambulatory Visit | Attending: General Surgery

## 2015-10-28 ENCOUNTER — Observation Stay (HOSPITAL_COMMUNITY)
Admission: RE | Admit: 2015-10-28 | Discharge: 2015-10-29 | Disposition: A | Discharge status: Home / Self Care | Payer: Medicare Other | Source: Ambulatory Visit | Attending: General Surgery | Admitting: General Surgery

## 2015-10-28 ENCOUNTER — Ambulatory Visit (HOSPITAL_COMMUNITY): Payer: Medicare Other | Admitting: Anesthesiology

## 2015-10-28 ENCOUNTER — Ambulatory Visit (HOSPITAL_COMMUNITY): Payer: Medicare Other

## 2015-10-28 ENCOUNTER — Encounter (HOSPITAL_COMMUNITY): Payer: Self-pay | Admitting: *Deleted

## 2015-10-28 DIAGNOSIS — E119 Type 2 diabetes mellitus without complications: Secondary | ICD-10-CM | POA: Insufficient documentation

## 2015-10-28 DIAGNOSIS — I10 Essential (primary) hypertension: Secondary | ICD-10-CM | POA: Diagnosis present

## 2015-10-28 DIAGNOSIS — Z87891 Personal history of nicotine dependence: Secondary | ICD-10-CM | POA: Diagnosis not present

## 2015-10-28 DIAGNOSIS — K828 Other specified diseases of gallbladder: Secondary | ICD-10-CM | POA: Diagnosis not present

## 2015-10-28 DIAGNOSIS — Z794 Long term (current) use of insulin: Secondary | ICD-10-CM | POA: Insufficient documentation

## 2015-10-28 DIAGNOSIS — G4733 Obstructive sleep apnea (adult) (pediatric): Secondary | ICD-10-CM | POA: Diagnosis present

## 2015-10-28 DIAGNOSIS — E7800 Pure hypercholesterolemia, unspecified: Secondary | ICD-10-CM | POA: Insufficient documentation

## 2015-10-28 DIAGNOSIS — J449 Chronic obstructive pulmonary disease, unspecified: Secondary | ICD-10-CM | POA: Diagnosis not present

## 2015-10-28 DIAGNOSIS — Z9989 Dependence on other enabling machines and devices: Secondary | ICD-10-CM

## 2015-10-28 DIAGNOSIS — K8010 Calculus of gallbladder with chronic cholecystitis without obstruction: Secondary | ICD-10-CM | POA: Diagnosis not present

## 2015-10-28 DIAGNOSIS — Z79899 Other long term (current) drug therapy: Secondary | ICD-10-CM | POA: Insufficient documentation

## 2015-10-28 DIAGNOSIS — Z9049 Acquired absence of other specified parts of digestive tract: Secondary | ICD-10-CM

## 2015-10-28 DIAGNOSIS — Z419 Encounter for procedure for purposes other than remedying health state, unspecified: Secondary | ICD-10-CM

## 2015-10-28 DIAGNOSIS — E1159 Type 2 diabetes mellitus with other circulatory complications: Secondary | ICD-10-CM

## 2015-10-28 HISTORY — DX: Other specified postprocedural states: Z98.890

## 2015-10-28 HISTORY — DX: Presence of external hearing-aid: Z97.4

## 2015-10-28 HISTORY — DX: Nausea with vomiting, unspecified: R11.2

## 2015-10-28 HISTORY — PX: CHOLECYSTECTOMY: SHX55

## 2015-10-28 LAB — GLUCOSE, CAPILLARY
GLUCOSE-CAPILLARY: 145 mg/dL — AB (ref 65–99)
GLUCOSE-CAPILLARY: 158 mg/dL — AB (ref 65–99)
GLUCOSE-CAPILLARY: 199 mg/dL — AB (ref 65–99)
GLUCOSE-CAPILLARY: 259 mg/dL — AB (ref 65–99)
Glucose-Capillary: 294 mg/dL — ABNORMAL HIGH (ref 65–99)
Glucose-Capillary: 299 mg/dL — ABNORMAL HIGH (ref 65–99)
Glucose-Capillary: 203 mg/dL — ABNORMAL HIGH (ref 65–99)

## 2015-10-28 LAB — CBC
HCT: 40.6 % (ref 39.0–52.0)
HEMOGLOBIN: 14 g/dL (ref 13.0–17.0)
MCH: 30.8 pg (ref 26.0–34.0)
MCHC: 34.5 g/dL (ref 30.0–36.0)
MCV: 89.2 fL (ref 78.0–100.0)
Platelets: 187 10*3/uL (ref 150–400)
RBC: 4.55 MIL/uL (ref 4.22–5.81)
RDW: 13.2 % (ref 11.5–15.5)
WBC: 5.9 10*3/uL (ref 4.0–10.5)

## 2015-10-28 LAB — BASIC METABOLIC PANEL
ANION GAP: 7 (ref 5–15)
BUN: 16 mg/dL (ref 6–20)
CALCIUM: 8.6 mg/dL — AB (ref 8.9–10.3)
CO2: 28 mmol/L (ref 22–32)
Chloride: 102 mmol/L (ref 101–111)
Creatinine, Ser: 1.05 mg/dL (ref 0.61–1.24)
Glucose, Bld: 148 mg/dL — ABNORMAL HIGH (ref 65–99)
Potassium: 3.5 mmol/L (ref 3.5–5.1)
SODIUM: 137 mmol/L (ref 135–145)

## 2015-10-28 SURGERY — LAPAROSCOPIC CHOLECYSTECTOMY WITH INTRAOPERATIVE CHOLANGIOGRAM
Anesthesia: General | Site: Abdomen

## 2015-10-28 MED ORDER — 0.9 % SODIUM CHLORIDE (POUR BTL) OPTIME
TOPICAL | Status: DC | PRN
  Administered 2015-10-28: 1000 mL

## 2015-10-28 MED ORDER — IOPAMIDOL (ISOVUE-300) INJECTION 61%
INTRAVENOUS | Status: DC | PRN
  Administered 2015-10-28: 3.5 mL via INTRAVENOUS

## 2015-10-28 MED ORDER — BUPIVACAINE-EPINEPHRINE 0.25% -1:200000 IJ SOLN
INTRAMUSCULAR | Status: AC
  Filled 2015-10-28: qty 1

## 2015-10-28 MED ORDER — CEFOTETAN DISODIUM-DEXTROSE 2-2.08 GM-% IV SOLR
2.0000 g | INTRAVENOUS | Status: AC
  Administered 2015-10-28: 2 g via INTRAVENOUS

## 2015-10-28 MED ORDER — AMLODIPINE BESYLATE 10 MG PO TABS
10.0000 mg | ORAL_TABLET | Freq: Every day | ORAL | Status: DC
  Administered 2015-10-29: 10 mg via ORAL
  Filled 2015-10-28: qty 1

## 2015-10-28 MED ORDER — LISINOPRIL 20 MG PO TABS
20.0000 mg | ORAL_TABLET | Freq: Every day | ORAL | Status: DC
  Administered 2015-10-29: 20 mg via ORAL
  Filled 2015-10-28: qty 1

## 2015-10-28 MED ORDER — PROPOFOL 10 MG/ML IV BOLUS
INTRAVENOUS | Status: DC | PRN
  Administered 2015-10-28: 50 mg via INTRAVENOUS
  Administered 2015-10-28: 40 mg via INTRAVENOUS
  Administered 2015-10-28: 50 mg via INTRAVENOUS
  Administered 2015-10-28: 160 mg via INTRAVENOUS

## 2015-10-28 MED ORDER — ONDANSETRON 4 MG PO TBDP: 4.0000 mg | ORAL_TABLET | Freq: Four times a day (QID) | ORAL | Status: DC | PRN

## 2015-10-28 MED ORDER — LACTATED RINGERS IV SOLN
INTRAVENOUS | Status: DC
  Administered 2015-10-28 (×2): via INTRAVENOUS

## 2015-10-28 MED ORDER — CETYLPYRIDINIUM CHLORIDE 0.05 % MT LIQD
7.0000 mL | Freq: Two times a day (BID) | OROMUCOSAL | Status: DC
  Administered 2015-10-28 – 2015-10-29 (×2): 7 mL via OROMUCOSAL

## 2015-10-28 MED ORDER — LACTATED RINGERS IR SOLN
Status: DC | PRN
  Administered 2015-10-28: 1000 mL

## 2015-10-28 MED ORDER — ONDANSETRON HCL 4 MG/2ML IJ SOLN
INTRAMUSCULAR | Status: DC | PRN
  Administered 2015-10-28 (×4): 2 mg via INTRAVENOUS

## 2015-10-28 MED ORDER — SUGAMMADEX SODIUM 200 MG/2ML IV SOLN
INTRAVENOUS | Status: DC | PRN
  Administered 2015-10-28: 200 mg via INTRAVENOUS

## 2015-10-28 MED ORDER — KETOROLAC TROMETHAMINE 15 MG/ML IJ SOLN
INTRAMUSCULAR | Status: DC | PRN
  Administered 2015-10-28: 15 mg via INTRAVENOUS

## 2015-10-28 MED ORDER — HYDRALAZINE HCL 20 MG/ML IJ SOLN: 10.0000 mg | INTRAMUSCULAR | Status: DC | PRN

## 2015-10-28 MED ORDER — LIDOCAINE HCL (CARDIAC) 20 MG/ML IV SOLN
INTRAVENOUS | Status: DC | PRN
Start: 2015-10-28 — End: 2015-10-28
  Administered 2015-10-28: 80 mg via INTRAVENOUS

## 2015-10-28 MED ORDER — OXYCODONE HCL 5 MG PO TABS: 5.0000 mg | ORAL_TABLET | ORAL | Status: DC | PRN

## 2015-10-28 MED ORDER — KETOROLAC TROMETHAMINE 30 MG/ML IJ SOLN
INTRAMUSCULAR | Status: AC
  Filled 2015-10-28: qty 1

## 2015-10-28 MED ORDER — ACETAMINOPHEN 500 MG PO TABS
1000.0000 mg | ORAL_TABLET | Freq: Four times a day (QID) | ORAL | Status: DC
  Administered 2015-10-28 – 2015-10-29 (×2): 1000 mg via ORAL
  Filled 2015-10-28 (×6): qty 2

## 2015-10-28 MED ORDER — POTASSIUM CHLORIDE IN NACL 20-0.45 MEQ/L-% IV SOLN
INTRAVENOUS | Status: DC
  Administered 2015-10-28: 17:00:00 via INTRAVENOUS
  Filled 2015-10-28 (×2): qty 1000

## 2015-10-28 MED ORDER — BUPIVACAINE-EPINEPHRINE 0.25% -1:200000 IJ SOLN
INTRAMUSCULAR | Status: DC | PRN
  Administered 2015-10-28: 50 mL

## 2015-10-28 MED ORDER — POLYETHYL GLYCOL-PROPYL GLYCOL 0.4-0.3 % OP SOLN: 1.0000 [drp] | Freq: Every day | OPHTHALMIC | Status: DC | PRN

## 2015-10-28 MED ORDER — FENTANYL CITRATE (PF) 100 MCG/2ML IJ SOLN
INTRAMUSCULAR | Status: AC
  Filled 2015-10-28: qty 2

## 2015-10-28 MED ORDER — DIPHENHYDRAMINE HCL 50 MG/ML IJ SOLN: 12.5000 mg | Freq: Four times a day (QID) | INTRAMUSCULAR | Status: DC | PRN

## 2015-10-28 MED ORDER — INSULIN ASPART 100 UNIT/ML ~~LOC~~ SOLN
SUBCUTANEOUS | Status: AC
  Filled 2015-10-28: qty 1

## 2015-10-28 MED ORDER — PANTOPRAZOLE SODIUM 40 MG IV SOLR
40.0000 mg | Freq: Every day | INTRAVENOUS | Status: DC
  Administered 2015-10-28: 40 mg via INTRAVENOUS
  Filled 2015-10-28 (×2): qty 40

## 2015-10-28 MED ORDER — PROPOFOL 10 MG/ML IV BOLUS
INTRAVENOUS | Status: AC
  Filled 2015-10-28: qty 20

## 2015-10-28 MED ORDER — ROCURONIUM BROMIDE 50 MG/5ML IV SOLN
INTRAVENOUS | Status: AC
  Filled 2015-10-28: qty 1

## 2015-10-28 MED ORDER — ONDANSETRON HCL 4 MG/2ML IJ SOLN: 4.0000 mg | Freq: Once | INTRAMUSCULAR | Status: DC

## 2015-10-28 MED ORDER — MORPHINE SULFATE (PF) 2 MG/ML IV SOLN: 1.0000 mg | INTRAVENOUS | Status: DC | PRN

## 2015-10-28 MED ORDER — LABETALOL HCL 5 MG/ML IV SOLN
INTRAVENOUS | Status: DC | PRN
  Administered 2015-10-28: 5 mg via INTRAVENOUS

## 2015-10-28 MED ORDER — INSULIN ASPART 100 UNIT/ML ~~LOC~~ SOLN
0.0000 [IU] | Freq: Three times a day (TID) | SUBCUTANEOUS | Status: DC
  Administered 2015-10-29: 0 [IU] via SUBCUTANEOUS

## 2015-10-28 MED ORDER — POLYVINYL ALCOHOL 1.4 % OP SOLN
1.0000 [drp] | Freq: Every day | OPHTHALMIC | Status: DC | PRN
  Filled 2015-10-28: qty 15

## 2015-10-28 MED ORDER — ONDANSETRON HCL 4 MG/2ML IJ SOLN: 4.0000 mg | Freq: Four times a day (QID) | INTRAMUSCULAR | Status: DC | PRN

## 2015-10-28 MED ORDER — LABETALOL HCL 200 MG PO TABS
200.0000 mg | ORAL_TABLET | Freq: Two times a day (BID) | ORAL | Status: DC
  Administered 2015-10-28 – 2015-10-29 (×2): 200 mg via ORAL
  Filled 2015-10-28 (×3): qty 1

## 2015-10-28 MED ORDER — CEFOTETAN DISODIUM-DEXTROSE 2-2.08 GM-% IV SOLR
INTRAVENOUS | Status: AC
  Filled 2015-10-28: qty 50

## 2015-10-28 MED ORDER — SIMETHICONE 80 MG PO CHEW: 40.0000 mg | CHEWABLE_TABLET | Freq: Four times a day (QID) | ORAL | Status: DC | PRN

## 2015-10-28 MED ORDER — DIPHENHYDRAMINE HCL 12.5 MG/5ML PO ELIX: 12.5000 mg | ORAL_SOLUTION | Freq: Four times a day (QID) | ORAL | Status: DC | PRN

## 2015-10-28 MED ORDER — ONDANSETRON HCL 4 MG/2ML IJ SOLN
INTRAMUSCULAR | Status: AC
  Filled 2015-10-28: qty 4

## 2015-10-28 MED ORDER — IOPAMIDOL (ISOVUE-300) INJECTION 61%
INTRAVENOUS | Status: AC
  Filled 2015-10-28: qty 50

## 2015-10-28 MED ORDER — DEXAMETHASONE SODIUM PHOSPHATE 10 MG/ML IJ SOLN
INTRAMUSCULAR | Status: DC | PRN
  Administered 2015-10-28: 5 mg via INTRAVENOUS

## 2015-10-28 MED ORDER — HYDROMORPHONE HCL 1 MG/ML IJ SOLN: 0.2500 mg | INTRAMUSCULAR | Status: DC | PRN

## 2015-10-28 MED ORDER — INSULIN ASPART 100 UNIT/ML ~~LOC~~ SOLN: 0.0000 [IU] | Freq: Every day | SUBCUTANEOUS | Status: DC

## 2015-10-28 MED ORDER — EPHEDRINE SULFATE 50 MG/ML IJ SOLN
INTRAMUSCULAR | Status: DC | PRN
  Administered 2015-10-28: 5 mg via INTRAVENOUS
  Administered 2015-10-28: 10 mg via INTRAVENOUS

## 2015-10-28 MED ORDER — LIDOCAINE HCL (CARDIAC) 20 MG/ML IV SOLN
INTRAVENOUS | Status: AC
  Filled 2015-10-28: qty 5

## 2015-10-28 MED ORDER — DONEPEZIL HCL 5 MG PO TABS
5.0000 mg | ORAL_TABLET | Freq: Every day | ORAL | Status: DC
  Administered 2015-10-28 – 2015-10-29 (×2): 5 mg via ORAL
  Filled 2015-10-28 (×2): qty 1

## 2015-10-28 MED ORDER — METHOCARBAMOL 500 MG PO TABS: 500.0000 mg | ORAL_TABLET | Freq: Four times a day (QID) | ORAL | Status: DC | PRN

## 2015-10-28 MED ORDER — EPHEDRINE SULFATE 50 MG/ML IJ SOLN
INTRAMUSCULAR | Status: AC
  Filled 2015-10-28: qty 1

## 2015-10-28 MED ORDER — POLYETHYLENE GLYCOL 3350 17 G PO PACK: 17.0000 g | PACK | Freq: Every day | ORAL | Status: DC | PRN

## 2015-10-28 MED ORDER — KETOROLAC TROMETHAMINE 30 MG/ML IJ SOLN: 15.0000 mg | Freq: Once | INTRAMUSCULAR | Status: DC | PRN

## 2015-10-28 MED ORDER — LISINOPRIL-HYDROCHLOROTHIAZIDE 20-12.5 MG PO TABS: 1.0000 | ORAL_TABLET | Freq: Every day | ORAL | Status: DC

## 2015-10-28 MED ORDER — INSULIN PUMP
SUBCUTANEOUS | Status: DC
  Administered 2015-10-28 – 2015-10-29 (×3): via SUBCUTANEOUS
  Filled 2015-10-28: qty 1

## 2015-10-28 MED ORDER — ENOXAPARIN SODIUM 40 MG/0.4ML ~~LOC~~ SOLN
40.0000 mg | SUBCUTANEOUS | Status: DC
  Administered 2015-10-29: 40 mg via SUBCUTANEOUS
  Filled 2015-10-28 (×2): qty 0.4

## 2015-10-28 MED ORDER — INSULIN ASPART 100 UNIT/ML ~~LOC~~ SOLN
15.0000 [IU] | Freq: Once | SUBCUTANEOUS | Status: AC
  Administered 2015-10-28: 15 [IU] via SUBCUTANEOUS

## 2015-10-28 MED ORDER — HYDROCHLOROTHIAZIDE 12.5 MG PO CAPS
12.5000 mg | ORAL_CAPSULE | Freq: Every day | ORAL | Status: DC
  Administered 2015-10-28 – 2015-10-29 (×2): 12.5 mg via ORAL
  Filled 2015-10-28 (×2): qty 1

## 2015-10-28 MED ORDER — ROCURONIUM BROMIDE 100 MG/10ML IV SOLN
INTRAVENOUS | Status: DC | PRN
  Administered 2015-10-28: 10 mg via INTRAVENOUS
  Administered 2015-10-28: 50 mg via INTRAVENOUS

## 2015-10-28 MED ORDER — FENTANYL CITRATE (PF) 100 MCG/2ML IJ SOLN
INTRAMUSCULAR | Status: DC | PRN
  Administered 2015-10-28: 100 ug via INTRAVENOUS
  Administered 2015-10-28 (×2): 50 ug via INTRAVENOUS

## 2015-10-28 MED ORDER — DEXAMETHASONE SODIUM PHOSPHATE 10 MG/ML IJ SOLN
INTRAMUSCULAR | Status: AC
  Filled 2015-10-28: qty 1

## 2015-10-28 MED ORDER — SUGAMMADEX SODIUM 200 MG/2ML IV SOLN
INTRAVENOUS | Status: AC
  Filled 2015-10-28: qty 2

## 2015-10-28 SURGICAL SUPPLY — 42 items
APPLICATOR ARISTA FLEXITIP XL (MISCELLANEOUS) IMPLANT
APPLIER CLIP 5 13 M/L LIGAMAX5 (MISCELLANEOUS) ×2
APPLIER CLIP ROT 10 11.4 M/L (STAPLE)
CABLE HIGH FREQUENCY MONO STRZ (ELECTRODE) ×2 IMPLANT
CHLORAPREP W/TINT 26ML (MISCELLANEOUS) ×2 IMPLANT
CLIP APPLIE 5 13 M/L LIGAMAX5 (MISCELLANEOUS) ×1 IMPLANT
CLIP APPLIE ROT 10 11.4 M/L (STAPLE) IMPLANT
COVER MAYO STAND STRL (DRAPES) ×2 IMPLANT
COVER SURGICAL LIGHT HANDLE (MISCELLANEOUS) ×2 IMPLANT
DECANTER SPIKE VIAL GLASS SM (MISCELLANEOUS) ×2 IMPLANT
DERMABOND ADVANCED (GAUZE/BANDAGES/DRESSINGS) ×1
DERMABOND ADVANCED .7 DNX12 (GAUZE/BANDAGES/DRESSINGS) ×1 IMPLANT
DRAPE C-ARM 42X120 X-RAY (DRAPES) ×2 IMPLANT
DRAPE LAPAROSCOPIC ABDOMINAL (DRAPES) IMPLANT
ELECT PENCIL ROCKER SW 15FT (MISCELLANEOUS) ×2 IMPLANT
ELECT REM PT RETURN 9FT ADLT (ELECTROSURGICAL) ×2
ELECTRODE REM PT RTRN 9FT ADLT (ELECTROSURGICAL) ×1 IMPLANT
GLOVE BIO SURGEON STRL SZ7.5 (GLOVE) ×2 IMPLANT
GLOVE INDICATOR 8.0 STRL GRN (GLOVE) ×2 IMPLANT
GOWN STRL REUS W/TWL XL LVL3 (GOWN DISPOSABLE) ×10 IMPLANT
HEMOSTAT ARISTA ABSORB 3G PWDR (MISCELLANEOUS) IMPLANT
HEMOSTAT SNOW SURGICEL 2X4 (HEMOSTASIS) IMPLANT
KIT BASIN OR (CUSTOM PROCEDURE TRAY) ×2 IMPLANT
L-HOOK LAP DISP 36CM (ELECTROSURGICAL) ×2
LHOOK LAP DISP 36CM (ELECTROSURGICAL) ×1 IMPLANT
POSITIONER SURGICAL ARM (MISCELLANEOUS) IMPLANT
POUCH RETRIEVAL ECOSAC 10 (ENDOMECHANICALS) ×1 IMPLANT
POUCH RETRIEVAL ECOSAC 10MM (ENDOMECHANICALS) ×1
SCISSORS LAP 5X35 DISP (ENDOMECHANICALS) ×2 IMPLANT
SET CHOLANGIOGRAPH MIX (MISCELLANEOUS) ×2 IMPLANT
SET IRRIG TUBING LAPAROSCOPIC (IRRIGATION / IRRIGATOR) ×2 IMPLANT
SLEEVE XCEL OPT CAN 5 100 (ENDOMECHANICALS) ×4 IMPLANT
SUT MNCRL AB 4-0 PS2 18 (SUTURE) ×2 IMPLANT
SUT VICRYL 0 UR6 27IN ABS (SUTURE) IMPLANT
TAPE CLOTH 4X10 WHT NS (GAUZE/BANDAGES/DRESSINGS) IMPLANT
TOWEL OR 17X26 10 PK STRL BLUE (TOWEL DISPOSABLE) ×2 IMPLANT
TOWEL OR NON WOVEN STRL DISP B (DISPOSABLE) ×2 IMPLANT
TRAY LAPAROSCOPIC (CUSTOM PROCEDURE TRAY) ×2 IMPLANT
TROCAR BLADELESS OPT 5 100 (ENDOMECHANICALS) ×2 IMPLANT
TROCAR XCEL BLUNT TIP 100MML (ENDOMECHANICALS) ×2 IMPLANT
TROCAR XCEL NON-BLD 11X100MML (ENDOMECHANICALS) IMPLANT
TUBING INSUF HEATED (TUBING) ×2 IMPLANT

## 2015-10-28 NOTE — Anesthesia Postprocedure Evaluation (Signed)
Anesthesia Post Note  Patient: Derrick [name]  Procedure(s) Performed: Procedure(s) (LRB): LAPAROSCOPIC CHOLECYSTECTOMY WITH   INTRAOPERATIVE CHOLANGIOGRAM (N/A)  Patient location during evaluation: PACU Anesthesia Type: General Level of consciousness: awake and alert Pain management: pain level controlled Vital Signs Assessment: post-procedure vital signs reviewed and stable Respiratory status: spontaneous breathing, nonlabored ventilation, respiratory function stable and patient connected to nasal cannula oxygen Cardiovascular status: blood pressure returned to baseline and stable Postop Assessment: no signs of nausea or vomiting Anesthetic complications: no    Last Vitals:  Filed Vitals:   10/28/15 1330 10/28/15 1343  BP: 166/69   Pulse: 79 82  Temp:    Resp: 18 18    Last Pain:  Filed Vitals:   10/28/15 1352  PainSc: 0-No pain                 [name]

## 2015-10-28 NOTE — Op Note (Signed)
Derrick [name] [id number] 08-01-1940 10/28/2015  Laparoscopic Cholecystectomy with IOC Procedure Note  Indications: This patient presents with epigastric and right upper quadrant pain and will undergo laparoscopic cholecystectomy. Please see office notes for additional details.   Pre-operative Diagnosis: epigastric/right upper quadrant  Post-operative Diagnosis: Same  Surgeon: Gayland Curry   Assistants: Servando Snare, RNFA  Anesthesia: General endotracheal anesthesia  Procedure Details  The patient was seen again in the Holding Room. The risks, benefits, complications, treatment options, and expected outcomes were discussed with the patient. The possibilities of reaction to medication, pulmonary aspiration, perforation of viscus, bleeding, recurrent infection, finding a normal gallbladder, the need for additional procedures, failure to diagnose a condition, the possible need to convert to an open procedure, and creating a complication requiring transfusion or operation were discussed with the patient. The likelihood of improving the patient's symptoms with return to their baseline status is good.  The patient and/or family concurred with the proposed plan, giving informed consent. The site of surgery properly noted. The patient was taken to Operating Room, identified as Derrick [name] and the procedure verified as Laparoscopic Cholecystectomy with Intraoperative Cholangiogram. A Time Out was held and the above information confirmed. Antibiotic prophylaxis was administered.   Prior to the induction of general anesthesia, antibiotic prophylaxis was administered. General endotracheal anesthesia was then administered and tolerated well. After the induction, the abdomen was prepped with Chloraprep and draped in the sterile fashion. The patient was positioned in the supine position.  Local anesthetic agent was injected into the skin near the umbilicus and an incision made. We dissected down  to the abdominal fascia with blunt dissection.  The fascia was incised vertically and we entered the peritoneal cavity bluntly.  A pursestring suture of 0-Vicryl was placed around the fascial opening.  The Hasson cannula was inserted and secured with the stay suture.  Pneumoperitoneum was then created with CO2 and tolerated well without any adverse changes in the patient's vital signs. An 5-mm port was placed in the subxiphoid position.  Two 5-mm ports were placed in the right upper quadrant. All skin incisions were infiltrated with a local anesthetic agent before making the incision and placing the trocars.   We positioned the patient in reverse Trendelenburg, tilted slightly to the patient's left.  The gallbladder was intrahepatic. The gallbladder was identified, the fundus grasped and retracted cephalad. Adhesions were lysed bluntly and with the electrocautery where indicated, taking care not to injure any adjacent organs or viscus. The infundibulum was grasped and retracted laterally, exposing the peritoneum overlying the triangle of Calot. This was then divided and exposed in a blunt fashion. A large critical view of the cystic duct and cystic artery was obtained.  The cystic duct was clearly identified and bluntly dissected circumferentially. The cystic duct was ligated with a clip distally.   An incision was made in the cystic duct and the [facility] cholangiogram catheter introduced. The catheter was secured using a clip. A cholangiogram was then obtained which showed good visualization of the distal and proximal biliary tree with no sign of filling defects or obstruction.  Contrast flowed easily into the duodenum. The patient had a very long cystic duct with a low insertion on the common duct. The common hepatic and left/right hepatic ducts were visible.  The catheter was then removed.   The cystic duct was then ligated with clips and divided. The cystic artery which had been identified & dissected free was  ligated with clips and divided as well.  The gallbladder was dissected from the liver bed in retrograde fashion with the electrocautery. There was some spillage of bile from the gallbladder. The gallbladder was removed and placed in an Ecco sac.  The gallbladder and Ecco sac were then removed through the umbilical port site. The liver bed was irrigated and inspected. Hemostasis was achieved with the electrocautery. Copious irrigation was utilized and was repeatedly aspirated until clear.  The pursestring suture was used to close the umbilical fascia.    We again inspected the right upper quadrant for hemostasis.  The umbilical closure was inspected and there was no air leak and nothing trapped within the closure. Pneumoperitoneum was released as we removed the trocars.  4-0 Monocryl was used to close the skin.   Dermabond was applied. The patient was then extubated and brought to the recovery room in stable condition. Instrument, sponge, and needle counts were correct at closure and at the conclusion of the case.   Findings: Probable chronic Cholecystitis with a few tiny Cholelithiasis  Estimated Blood Loss: Minimal         Drains: none         Specimens: Gallbladder           Complications: None; patient tolerated the procedure well.         Disposition: PACU - hemodynamically stable.         Condition: stable  Leighton Ruff. Redmond Pulling, MD, FACS General, Bariatric, & Minimally Invasive Surgery [facility], [place]

## 2015-10-28 NOTE — Progress Notes (Signed)
Patient may go to room- per Dr. Kalman Shan- Dr. Kalman Shan made aware of repeat CBG results in PACU- 299

## 2015-10-28 NOTE — Anesthesia Preprocedure Evaluation (Signed)
Anesthesia Evaluation  Patient identified by MRN, date of birth, ID band Patient awake    Reviewed: Allergy & Precautions, NPO status , Patient's Chart, lab work & pertinent test results  Airway Mallampati: II  TM Distance: >3 FB Neck ROM: Full    Dental no notable dental hx.    Pulmonary sleep apnea and Continuous Positive Airway Pressure Ventilation , former smoker,    Pulmonary exam normal breath sounds clear to auscultation       Cardiovascular hypertension, Pt. on medications Normal cardiovascular exam Rhythm:Regular Rate:Normal     Neuro/Psych negative neurological ROS  negative psych ROS   GI/Hepatic negative GI ROS, Neg liver ROS,   Endo/Other  diabetes, Insulin Dependent  Renal/GU negative Renal ROS  negative genitourinary   Musculoskeletal negative musculoskeletal ROS (+)   Abdominal   Peds negative pediatric ROS (+)  Hematology negative hematology ROS (+)   Anesthesia Other Findings   Reproductive/Obstetrics negative OB ROS                             Anesthesia Physical Anesthesia Plan  ASA: III  Anesthesia Plan: General   Post-op Pain Management:    Induction: Intravenous  Airway Management Planned: Oral ETT  Additional Equipment:   Intra-op Plan:   Post-operative Plan: Extubation in OR  Informed Consent: I have reviewed the patients History and Physical, chart, labs and discussed the procedure including the risks, benefits and alternatives for the proposed anesthesia with the patient or authorized representative who has indicated his/her understanding and acceptance.   Dental advisory given  Plan Discussed with: CRNA and Surgeon  Anesthesia Plan Comments: (No versed)        Anesthesia Quick Evaluation

## 2015-10-28 NOTE — Progress Notes (Signed)
Inpatient Diabetes Program Recommendations  AACE/ADA: New Consensus Statement on Inpatient Glycemic Control (2015)  Target Ranges:  Prepandial:   less than 140 mg/dL      Peak postprandial:   less than 180 mg/dL (1-2 hours)      Critically ill patients:  140 - 180 mg/dL   Review of Glycemic Control  Diabetes history: DM2 Outpatient Diabetes medications: insulin pump Current orders for Inpatient glycemic control: Novolog resistant tidwc and hs  Results for Derrick [name], Derrick [name] (MRN [id number]) as of 10/28/2015 16:08  Ref. Range 10/28/2015 09:34 10/28/2015 11:20 10/28/2015 13:22 10/28/2015 14:19  Glucose-Capillary Latest Ref Range: 65-99 mg/dL 145 (H) 158 (H) 294 (H) 299 (H)    Inpatient Diabetes Program Recommendations:    Insulin Pump Order Set - pt has supplies. Have pt sign contract and give CHO worksheet for pt to write down his boluses. Please check blood sugars Q4H x 12, then tidwc and hs. Pt will bolus for CHOs and correction. Discussed with pt, family and RN.  Pump rates: 12 AM - .7, 4 am - 1.1, 8 am 1.4, 5 pm 1.9 - Total basal 33.1 units

## 2015-10-28 NOTE — H&P (View-Only) (Signed)
Derrick [name] Location: [facility] Patient #: [id number] DOB: 1940-11-17 Married / Language: English / Race: White Male   History of Present Illness  Patient words: New-gallbladder.  The patient is a 75 year old male who presents for evaluation of gallbladder disease. He is referred by Dr [name] for evaluation for possible gallbladder disease. He reports 2 episodes last month occurring at night in which he describes he had a crawling sensation from his epigastric area down to his umbilical region. He states that it happened the following night after the initial night. He had some dark stool. He denies any nausea, vomiting, right upper quadrant or shoulder pain. He denies any jaundice. He denies any significant NSAID use. He denies any sharp pain. He ended up seeing his primary care physician who ordered an ultrasound. It was read as cholesterol crystals and a attentional small polyp in the gallbladder neck. He states it was more of a discomfort and not a true pain. It did interfere with his sleep when it occurred. He underwent a nuclear medicine scan which showed normal tracer extraction from the blood stream indicating normal hepatocellular function. There is normal excretion of tracer into the biliary tree. The gallbladder had not been visualized at one hour. At 62 minutes there was tracer demonstrated within the gallbladder. He did not experience any symptoms during CCK administration. Gallbladder ejection fraction could not be calculated  He denies any chest pain, chest pressure, shortness of breath, dyspnea on exertion, orthopnea, paroxysmal nocturnal dyspnea  He called back in stating he was having more frequent episodes and wanted to proceed with surgery.  Problem List/Past Medical  BILIARY DYSKINESIA (K82.8)  Other Problems  Chronic Obstructive Lung Disease Diabetes Mellitus High blood pressure Hypercholesterolemia Sleep Apnea  Past  Surgical History Shoulder Surgery Bilateral. Tonsillectomy  Diagnostic Studies History Colonoscopy 1-5 years ago  Allergies  No Known Drug Allergies02/16/2017  Medication History AmLODIPine Besylate (10MG  Tablet, Oral) Active. Donepezil HCl (5MG  Tablet, Oral) Active. Labetalol HCl (200MG  Tablet, Oral) Active. Lisinopril-Hydrochlorothiazide (20-12.5MG  Tablet, Oral) Active. NovoLOG (100UNIT/ML Solution, Subcutaneous) Active. Rosuvastatin Calcium (20MG  Tablet, Oral) Active. Pantoprazole Sodium (40MG  Tablet DR, Oral) Active. Lantus (100UNIT/ML Solution, Subcutaneous) Active. Medications Reconciled  Social History  Caffeine use Carbonated beverages, Coffee. No alcohol use No drug use Tobacco use Former smoker.  Family History Heart Disease Father.    Review of Systems General Present- Fatigue and Night Sweats. Not Present- Appetite Loss, Chills, Fever, Weight Gain and Weight Loss. Skin Not Present- Change in Wart/Mole, Dryness, Hives, Jaundice, New Lesions, Non-Healing Wounds, Rash and Ulcer. HEENT Present- Hearing Loss and Wears glasses/contact lenses. Not Present- Earache, Hoarseness, Nose Bleed, Oral Ulcers, Ringing in the Ears, Seasonal Allergies, Sinus Pain, Sore Throat, Visual Disturbances and Yellow Eyes. Respiratory Not Present- Bloody sputum, Chronic Cough, Difficulty Breathing, Snoring and Wheezing. Breast Not Present- Breast Mass, Breast Pain, Nipple Discharge and Skin Changes. Cardiovascular Not Present- Chest Pain, Difficulty Breathing Lying Down, Leg Cramps, Palpitations, Rapid Heart Rate, Shortness of Breath and Swelling of Extremities. Gastrointestinal Not Present- Abdominal Pain, Bloating, Bloody Stool, Change in Bowel Habits, Chronic diarrhea, Constipation, Difficulty Swallowing, Excessive gas, Gets full quickly at meals, Hemorrhoids, Indigestion, Nausea, Rectal Pain and Vomiting. Male Genitourinary Present- Impotence. Not Present- Blood in  Urine, Change in Urinary Stream, Frequency, Nocturia, Painful Urination, Urgency and Urine Leakage. Musculoskeletal Present- Back Pain. Not Present- Joint Pain, Joint Stiffness, Muscle Pain, Muscle Weakness and Swelling of Extremities. Neurological Present- Decreased Memory. Not Present- Fainting, Headaches, Numbness, Seizures, Tingling, Tremor,  Trouble walking and Weakness. Psychiatric Present- Change in Sleep Pattern. Not Present- Anxiety, Bipolar, Depression, Fearful and Frequent crying. Endocrine Not Present- Cold Intolerance, Excessive Hunger, Hair Changes, Heat Intolerance, Hot flashes and New Diabetes. Hematology Not Present- Easy Bruising, Excessive bleeding, Gland problems, HIV and Persistent Infections.  Vitals Weight: 190 lb Height: 68in Body Surface Area: 2 m Body Mass Index: 28.89 kg/m  Temp.: 97.56F(Oral)  Pulse: 80 (Regular)  BP: 110/68 (Sitting, Left Arm, Standard)       Physical Exam  General Mental Status-Alert. General Appearance-Consistent with stated age. Hydration-Well hydrated. Voice-Normal.  Head and Neck Head-normocephalic, atraumatic with no lesions or palpable masses. Trachea-midline. Thyroid Gland Characteristics - normal size and consistency.  Eye Eyeball - Bilateral-Extraocular movements intact. Sclera/Conjunctiva - Bilateral-No scleral icterus.  Chest and Lung Exam Chest and lung exam reveals -quiet, even and easy respiratory effort with no use of accessory muscles and on auscultation, normal breath sounds, no adventitious sounds and normal vocal resonance. Inspection Chest Wall - Normal. Back - normal.  Breast - Did not examine.  Cardiovascular Cardiovascular examination reveals -normal heart sounds, regular rate and rhythm with no murmurs and normal pedal pulses bilaterally.  Abdomen Inspection Inspection of the abdomen reveals - No Hernias. Skin - Scar - no surgical  scars. Palpation/Percussion Palpation and Percussion of the abdomen reveal - Soft, Non Tender, No Rebound tenderness, No Rigidity (guarding) and No hepatosplenomegaly. Auscultation Auscultation of the abdomen reveals - Bowel sounds normal.  Peripheral Vascular Upper Extremity Palpation - Pulses bilaterally normal.  Neurologic Neurologic evaluation reveals -alert and oriented x 3 with no impairment of recent or remote memory. Mental Status-Normal.  Neuropsychiatric The patient's mood and affect are described as -normal. Judgment and Insight-insight is appropriate concerning matters relevant to self.  Musculoskeletal Normal Exam - Left-Upper Extremity Strength Normal and Lower Extremity Strength Normal. Normal Exam - Right-Upper Extremity Strength Normal and Lower Extremity Strength Normal.  Lymphatic Head & Neck  General Head & Neck Lymphatics: Bilateral - Description - Normal. Axillary - Did not examine. Femoral & Inguinal - Did not examine.    Assessment & Plan BILIARY DYSKINESIA (K82.8) Impression: His symptoms are not classic for biliary dyskinesia. His main complaint is a sensation of crawling from his umbilicus to epigastric region. He did have some cholesterol deposits seen on ultrasound. However a component of his symptoms may be attributed to his gallbladder. He does have long-standing diabetes and there could be some underlying gastroparesis. I had a very extensive prolonged discussion with him and his wife regarding gallbladder disease and surgical management.  The patient was given Neurosurgeon. We discussed operative management. We discussed the signs & symptoms of acute cholecystitis  I discussed laparoscopic cholecystectomy with IOC in detail. The patient was given educational material as well as diagrams detailing the procedure. We discussed the risks and benefits of a laparoscopic cholecystectomy including, but not limited to bleeding,  infection, injury to surrounding structures such as the intestine or liver, bile leak, retained gallstones, need to convert to an open procedure, prolonged diarrhea, blood clots such as DVT, common bile duct injury, anesthesia risks, and possible need for additional procedures. We discussed the typical post-operative recovery course. I explained that the likelihood of improvement of their symptoms is fair to good  I told him I could not guarantee at this point that with cholecystectomy his symptoms would not recur. We discussed the possibility that a component of his symptoms may be due to perhaps gastroparesis given his long-standing diabetes. We  discussed a gastric emptying study. We discussed the pros and cons of cholecystectomy to see if it would eliminate his symptoms and get it out of the differential versus doing additional testing. He is going to discuss it with his wife over the next couple days and let us know how he would like to proceed. He is to call us and let us know whether or not he would like to proceed with gastric emptying study or cholecystectomy or continued observ Current Plans Patient or caregiver to follow up by phone with update next week  let me know how you would like to proceed - surgery, gastric emptying study, continued observation  please call with questions  Pt Education - Pamphlet Given - Laparoscopic Gallbladder Surgery: discussed with patient and provided information.  Leighton Ruff. Redmond Pulling, MD, FACS General, Bariatric, & Minimally Invasive Surgery [facility], [place]

## 2015-10-28 NOTE — Progress Notes (Signed)
Notified Dr. Kalman Shan of patient's insulin pump. Patient to remove pump prior to surgery. Patient's wife states patient gets extremely "loppy and senile" and will not be able to manage pump immediately following procedure.

## 2015-10-28 NOTE — Interval H&P Note (Signed)
History and Physical Interval Note:  10/28/2015 11:18 AM  Derrick [name]  has presented today for surgery, with the diagnosis of Biliary dyskinesia  The various methods of treatment have been discussed with the patient and family. After consideration of risks, benefits and other options for treatment, the patient has consented to  Procedure(s): LAPAROSCOPIC CHOLECYSTECTOMY WITH POSSIBLE  INTRAOPERATIVE CHOLANGIOGRAM (N/A) as a surgical intervention .  The patient's history has been reviewed, patient examined, no change in status, stable for surgery.  I have reviewed the patient's chart and labs.  Questions were answered to the patient's satisfaction.    Leighton Ruff. Redmond Pulling, MD, [place], Bariatric, & Minimally Invasive Surgery [facility], [place]    [facility]

## 2015-10-28 NOTE — Anesthesia Procedure Notes (Signed)
Procedure Name: Intubation Date/Time: 10/28/2015 12:06 PM Performed by: Freddie Breech Pre-anesthesia Checklist: Patient identified, Emergency Drugs available, Suction available, Patient being monitored and Timeout performed Patient Re-evaluated:Patient Re-evaluated prior to inductionOxygen Delivery Method: Circle system utilized Preoxygenation: Pre-oxygenation with 100% oxygen Intubation Type: IV induction Ventilation: Mask ventilation without difficulty and Oral airway inserted - appropriate to patient size Laryngoscope Size: Mac and 4 Grade View: Grade III Tube type: Oral Tube size: 7.5 mm Number of attempts: 1 Airway Equipment and Method: Patient positioned with wedge pillow and Stylet Placement Confirmation: ETT inserted through vocal cords under direct vision,  positive ETCO2,  CO2 detector and breath sounds checked- equal and bilateral Secured at: 21 cm Tube secured with: Tape Dental Injury: Teeth and Oropharynx as per pre-operative assessment

## 2015-10-28 NOTE — Transfer of Care (Signed)
Immediate Anesthesia Transfer of Care Note  Patient: Derrick [name]  Procedure(s) Performed: Procedure(s): LAPAROSCOPIC CHOLECYSTECTOMY WITH   INTRAOPERATIVE CHOLANGIOGRAM (N/A)  Patient Location: PACU  Anesthesia Type:General  Level of Consciousness:  sedated, patient cooperative and responds to stimulation  Airway & Oxygen Therapy:Patient Spontanous Breathing and Patient connected to face mask oxgen  Post-op Assessment:  Report given to PACU RN and Post -op Vital signs reviewed and stable  Post vital signs:  Reviewed and stable  Last Vitals:  Filed Vitals:   10/28/15 0938  BP: 173/72  Pulse: 64  Temp: 36.7 C  Resp: 18    Complications: No apparent anesthesia complications

## 2015-10-28 NOTE — Progress Notes (Signed)
   10/28/15 2329  BiPAP/CPAP/SIPAP  BiPAP/CPAP/SIPAP Pt Type Adult  Mask Type Nasal mask  Mask Size Medium  Set Rate 0 breaths/min  Respiratory Rate 16 breaths/min  IPAP 10 cmH20  EPAP 10 cmH2O  Oxygen Percent 28 %  Flow Rate 3 lpm  BiPAP/CPAP/SIPAP CPAP  Patient Home Equipment No (except for tubing and mask)  Auto Titrate No

## 2015-10-29 DIAGNOSIS — E119 Type 2 diabetes mellitus without complications: Secondary | ICD-10-CM

## 2015-10-29 DIAGNOSIS — I10 Essential (primary) hypertension: Secondary | ICD-10-CM | POA: Diagnosis present

## 2015-10-29 DIAGNOSIS — E1159 Type 2 diabetes mellitus with other circulatory complications: Secondary | ICD-10-CM

## 2015-10-29 DIAGNOSIS — E7800 Pure hypercholesterolemia, unspecified: Secondary | ICD-10-CM | POA: Diagnosis not present

## 2015-10-29 DIAGNOSIS — K8010 Calculus of gallbladder with chronic cholecystitis without obstruction: Secondary | ICD-10-CM | POA: Diagnosis not present

## 2015-10-29 DIAGNOSIS — J449 Chronic obstructive pulmonary disease, unspecified: Secondary | ICD-10-CM | POA: Diagnosis not present

## 2015-10-29 DIAGNOSIS — G4733 Obstructive sleep apnea (adult) (pediatric): Secondary | ICD-10-CM | POA: Diagnosis not present

## 2015-10-29 LAB — BASIC METABOLIC PANEL
ANION GAP: 9 (ref 5–15)
BUN: 24 mg/dL — ABNORMAL HIGH (ref 6–20)
CHLORIDE: 106 mmol/L (ref 101–111)
CO2: 23 mmol/L (ref 22–32)
Calcium: 8.4 mg/dL — ABNORMAL LOW (ref 8.9–10.3)
Creatinine, Ser: 1.15 mg/dL (ref 0.61–1.24)
GFR calc non Af Amer: >60 mL/min (ref >60)
GLUCOSE: 268 mg/dL — AB (ref 65–99)
Potassium: 3.9 mmol/L (ref 3.5–5.1)
Sodium: 138 mmol/L (ref 135–145)

## 2015-10-29 LAB — HEMOGLOBIN A1C
HEMOGLOBIN A1C: 8.3 % — AB (ref 4.8–5.6)
MEAN PLASMA GLUCOSE: 192 mg/dL

## 2015-10-29 LAB — CBC
HEMATOCRIT: 38 % — AB (ref 39.0–52.0)
HEMOGLOBIN: 13.1 g/dL (ref 13.0–17.0)
MCH: 30.9 pg (ref 26.0–34.0)
MCHC: 34.5 g/dL (ref 30.0–36.0)
MCV: 89.6 fL (ref 78.0–100.0)
Platelets: 170 10*3/uL (ref 150–400)
RBC: 4.24 MIL/uL (ref 4.22–5.81)
RDW: 13.2 % (ref 11.5–15.5)
WBC: 9.4 10*3/uL (ref 4.0–10.5)

## 2015-10-29 LAB — GLUCOSE, CAPILLARY: Glucose-Capillary: 268 mg/dL — ABNORMAL HIGH (ref 65–99)

## 2015-10-29 MED ORDER — OXYCODONE HCL 5 MG PO TABS: 5.0000 mg | ORAL_TABLET | ORAL | Status: DC | PRN

## 2015-10-29 NOTE — Discharge Summary (Signed)
Physician Discharge Summary  Derrick [name] [name] DOB: 1941/02/14 DOA: 10/28/2015  PCP: Geoffery Lyons, MD  Admit date: 10/28/2015 Discharge date: 10/29/2015  Recommendations for Outpatient Follow-up:   Follow-up Information    Follow up with Gayland Curry, MD. Schedule an appointment as soon as possible for a visit in 3 weeks.   Specialty:  General Surgery   Why:  For wound re-check   Contact information:   [place]  [address] [phone]      Discharge Diagnoses: . Principal Problem:   S/P laparoscopic cholecystectomy Active Problems:   OSA on CPAP   Diabetes mellitus ([place])   HTN (hypertension)  Surgical Procedure: laparoscopic cholecystectomy with ioc  Discharge Condition: good Disposition: home  Diet recommendation: diabetic  Filed Weights   10/28/15 0938  Weight: 86.694 kg (191 lb 2 oz)    Hospital Course:  He came in for planned lap chole for epigastric pain. He was kept overnight for observation. He was placed on CPAP at night. He also had his insulin pump reattached and underwent blood sugar monitoring. On POD 1 he was doing well. States he slept well. Felt good. Tolerated solid food for breakfast. Had stable vitals and pain controlled  BP 115/89 mmHg  Pulse 72  Temp(Src) 98 F (36.7 C) (Oral)  Resp 18  Ht 5\' 8"  (1.727 m)  Wt 86.694 kg (191 lb 2 oz)  BMI 29.07 kg/m2  SpO2 99%  Gen: alert, NAD, non-toxic appearing Pupils: equal, no scleral icterus Pulm: Lungs clear to auscultation, symmetric chest rise CV: regular rate and rhythm Abd: soft, approp mild tender, nondistended. Some periumbilical bruising. No cellulitis. No incisional hernia Ext: no edema, no calf tenderness Skin: no rash, no jaundice    Discharge Instructions  Discharge Instructions    Call MD for:  hives    Complete by:  As directed      Call MD for:  persistant dizziness or light-headedness    Complete by:  As directed      Call MD  for:  persistant nausea and vomiting    Complete by:  As directed      Call MD for:  redness, tenderness, or signs of infection (pain, swelling, redness, odor or green/yellow discharge around incision site)    Complete by:  As directed      Call MD for:  severe uncontrolled pain    Complete by:  As directed      Call MD for:    Complete by:  As directed   Temperature >101     Diet - low sodium heart healthy    Complete by:  As directed      Discharge instructions    Complete by:  As directed   See [facility] discharge instructions     Increase activity slowly    Complete by:  As directed             Medication List    TAKE these medications        amLODipine 10 MG tablet  Commonly known as:  NORVASC  Take 10 mg by mouth daily. Patient takes in the am     donepezil 5 MG tablet  Commonly known as:  ARICEPT  Take 5 mg by mouth daily. Patient takes in the pm     ICAPS AREDS FORMULA PO  Take 1 tablet by mouth 2 (two) times daily.     insulin aspart 100 UNIT/ML injection  Commonly known as:  novoLOG  Inject 0-15 Units into the skin 3 (three) times daily with meals. Check blood sugars every 4 hours while awake and use the Humalog pen: Greater than 350 give 12 units, 251-350 give 8 units, 151-250 give 4 units.     insulin pump Soln  Inject into the skin. Patient does not know basal rate - have requested info from office of Dr [name] on 10/25/15.     labetalol 200 MG tablet  Commonly known as:  NORMODYNE  Take 200 mg by mouth 2 (two) times daily.     lisinopril-hydrochlorothiazide 20-12.5 MG tablet  Commonly known as:  PRINZIDE,ZESTORETIC  Take 1 tablet by mouth daily.     naproxen sodium 220 MG tablet  Commonly known as:  ANAPROX  Take 220 mg by mouth 2 (two) times daily as needed (pain). Patient takes nightly for back pain per patient     oxyCODONE 5 MG immediate release tablet  Commonly known as:  Oxy IR/ROXICODONE  Take 1-2 tablets (5-10 mg total) by mouth every 4 (four)  hours as needed for moderate pain.     Polyethyl Glycol-Propyl Glycol 0.4-0.3 % Soln  Apply 1 drop to eye daily as needed (dry eyes).     rosuvastatin 20 MG tablet  Commonly known as:  CRESTOR  Take 20 mg by mouth daily.           Follow-up Information    Follow up with Gayland Curry, MD. Schedule an appointment as soon as possible for a visit in 3 weeks.   Specialty:  General Surgery   Why:  For wound re-check   Contact information:   [place] [address] [phone]        The results of significant diagnostics from this hospitalization (including imaging, microbiology, ancillary and laboratory) are listed below for reference.    Significant Diagnostic Studies: Dg Cholangiogram Operative  10/28/2015  CLINICAL DATA:  Intraoperative cholangiogram during laparoscopic cholecystectomy EXAM: INTRAOPERATIVE CHOLANGIOGRAM FLUOROSCOPY TIME:  12 seconds COMPARISON:  Nuclear medicine HIDA scan with CCK augmentation -08/01/2015 FINDINGS: Intraoperative cholangiographic images of the right upper abdominal quadrant during laparoscopic cholecystectomy are provided for review. Surgical clips overlie the expected location of the gallbladder fossa. Contrast injection demonstrates selective cannulation of the central aspect of the cystic duct. There is passage of contrast through the central aspect of the cystic duct with filling of a non dilated common bile duct. There is passage of contrast though the CBD and into the descending portion of the duodenum. There is minimal reflux of injected contrast into the common hepatic duct and central aspect of the non dilated intrahepatic biliary system. There are no discrete filling defects within the opacified portions of the biliary system to suggest the presence of choledocholithiasis. IMPRESSION: No evidence of choledocholithiasis. Electronically Signed   By: Sandi Mariscal M.D.   On: 10/28/2015 12:29    Microbiology: No results  found for this or any previous visit (from the past 240 hour(s)).   Labs: Basic Metabolic Panel:  Recent Labs Lab 10/28/15 1030 10/29/15 0438  NA 137 138  K 3.5 3.9  CL 102 106  CO2 28 23  GLUCOSE 148* 268*  BUN 16 24*  CREATININE 1.05 1.15  CALCIUM 8.6* 8.4*   Liver Function Tests: No results for input(s): AST, ALT, ALKPHOS, BILITOT, PROT, ALBUMIN in the last 168 hours. No results for input(s): LIPASE, AMYLASE in the last 168 hours. No results for input(s): AMMONIA in the last 168  hours. CBC:  Recent Labs Lab 10/28/15 1030 10/29/15 0438  WBC 5.9 9.4  HGB 14.0 13.1  HCT 40.6 38.0*  MCV 89.2 89.6  PLT 187 170   Cardiac Enzymes: No results for input(s): CKTOTAL, CKMB, CKMBINDEX, TROPONINI in the last 168 hours. BNP: BNP (last 3 results) No results for input(s): BNP in the last 8760 hours.  ProBNP (last 3 results) No results for input(s): PROBNP in the last 8760 hours.  CBG:  Recent Labs Lab 10/28/15 1419 10/28/15 1603 10/28/15 1832 10/28/15 2214 10/29/15 0750  GLUCAP 299* 199* 203* 259* 268*    Active Problems:   S/P laparoscopic cholecystectomy   Time coordinating discharge: 15 minutes  Signed:  Gayland Curry, MD [facility], [place] [phone] 10/29/2015, 10:41 AM

## 2015-10-29 NOTE — Discharge Instructions (Signed)
[facility] °LAPAROSCOPIC SURGERY: POST OP INSTRUCTIONS °Always review your discharge instruction sheet given to you by the facility where your surgery was performed. °IF YOU HAVE DISABILITY OR FAMILY LEAVE FORMS, YOU MUST BRING THEM TO THE OFFICE FOR PROCESSING.   °DO NOT GIVE THEM TO YOUR DOCTOR. ° °1. A prescription for pain medication may be given to you upon discharge.  Take your pain medication as prescribed, if needed.  If narcotic pain medicine is not needed, then you may take acetaminophen (Tylenol) or ibuprofen (Advil) as needed. °2. Take your usually prescribed medications unless otherwise directed. °3. If you need a refill on your pain medication, please contact your pharmacy.  They will contact our office to request authorization. Prescriptions will not be filled after 5pm or on week-ends. °4. You should follow a light diet the first few days after arrival home, such as soup and crackers, etc.  Be sure to include lots of fluids daily. °5. Most patients will experience some swelling and bruising in the area of the incisions.  Ice packs will help.  Swelling and bruising can take several days to resolve.  °6. It is common to experience some constipation if taking pain medication after surgery.  Increasing fluid intake and taking a stool softener (such as Colace) will usually help or prevent this problem from occurring.  A mild laxative (Milk of Magnesia or Miralax) should be taken according to package instructions if there are no bowel movements after 48 hours. °7. Unless discharge instructions indicate otherwise, you may remove your bandages 24-48 hours after surgery, and you may shower at that time.  You may have steri-strips (small skin tapes) in place directly over the incision.  These strips should be left on the skin for 7-10 days.  If your surgeon used skin glue on the incision, you may shower in 24 hours.  The glue will flake off over the next 2-3 weeks.  Any sutures or  staples will be removed at the office during your follow-up visit. °8. ACTIVITIES:  You may resume regular (light) daily activities beginning the next day--such as daily self-care, walking, climbing stairs--gradually increasing activities as tolerated.  You may have sexual intercourse when it is comfortable.  Refrain from any heavy lifting or straining until approved by your doctor. °a. You may drive when you are no longer taking prescription pain medication, you can comfortably wear a seatbelt, and you can safely maneuver your car and apply brakes. °9. You should see your doctor in the office for a follow-up appointment approximately 2-3 weeks after your surgery.  Make sure that you call for this appointment within a day or two after you arrive home to insure a convenient appointment time. °10. OTHER INSTRUCTIONS:  °WHEN TO CALL YOUR DOCTOR: °1. Fever over 101.0 °2. Inability to urinate °3. Continued bleeding from incision. °4. Increased pain, redness, or drainage from the incision. °5. Increasing abdominal pain ° °The clinic staff is available to answer your questions during regular business hours.  Please don’t hesitate to call and ask to speak to one of the nurses for clinical concerns.  If you have a medical emergency, go to the nearest emergency room or call 911.  A surgeon from [facility] is always on call at the hospital. °[address], [address], [place], [place]  [address] ? [address] [address], [place], [place]   [address] °[phone] ? [phone] ? FAX [phone] °Web site: [email or web address] ° °

## 2015-10-29 NOTE — Progress Notes (Signed)
Pt and daughter given discharge instructions and all questions answered. Oxy prescription sent home with patient. Discharged with daughter and son in law via private vehicle.

## 2016-01-29 DIAGNOSIS — E669 Obesity, unspecified: Secondary | ICD-10-CM | POA: Diagnosis not present

## 2016-01-29 DIAGNOSIS — I129 Hypertensive chronic kidney disease with stage 1 through stage 4 chronic kidney disease, or unspecified chronic kidney disease: Secondary | ICD-10-CM | POA: Diagnosis not present

## 2016-01-29 DIAGNOSIS — I10 Essential (primary) hypertension: Secondary | ICD-10-CM | POA: Diagnosis not present

## 2016-01-29 DIAGNOSIS — Z6831 Body mass index (BMI) 31.0-31.9, adult: Secondary | ICD-10-CM | POA: Diagnosis not present

## 2016-01-29 DIAGNOSIS — N182 Chronic kidney disease, stage 2 (mild): Secondary | ICD-10-CM | POA: Diagnosis not present

## 2016-01-29 DIAGNOSIS — K829 Disease of gallbladder, unspecified: Secondary | ICD-10-CM | POA: Diagnosis not present

## 2016-01-29 DIAGNOSIS — E784 Other hyperlipidemia: Secondary | ICD-10-CM | POA: Diagnosis not present

## 2016-01-29 DIAGNOSIS — E1129 Type 2 diabetes mellitus with other diabetic kidney complication: Secondary | ICD-10-CM | POA: Diagnosis not present

## 2016-01-29 DIAGNOSIS — Z794 Long term (current) use of insulin: Secondary | ICD-10-CM | POA: Diagnosis not present

## 2016-02-19 DIAGNOSIS — H353121 Nonexudative age-related macular degeneration, left eye, early dry stage: Secondary | ICD-10-CM | POA: Diagnosis not present

## 2016-02-19 DIAGNOSIS — E113393 Type 2 diabetes mellitus with moderate nonproliferative diabetic retinopathy without macular edema, bilateral: Secondary | ICD-10-CM | POA: Diagnosis not present

## 2016-02-19 DIAGNOSIS — H353212 Exudative age-related macular degeneration, right eye, with inactive choroidal neovascularization: Secondary | ICD-10-CM | POA: Diagnosis not present

## 2016-04-02 DIAGNOSIS — Z23 Encounter for immunization: Secondary | ICD-10-CM | POA: Diagnosis not present

## 2016-05-12 DIAGNOSIS — E1351 Other specified diabetes mellitus with diabetic peripheral angiopathy without gangrene: Secondary | ICD-10-CM | POA: Diagnosis not present

## 2016-05-12 DIAGNOSIS — L602 Onychogryphosis: Secondary | ICD-10-CM | POA: Diagnosis not present

## 2016-05-12 DIAGNOSIS — L84 Corns and callosities: Secondary | ICD-10-CM | POA: Diagnosis not present

## 2016-05-28 DIAGNOSIS — I10 Essential (primary) hypertension: Secondary | ICD-10-CM | POA: Diagnosis not present

## 2016-05-28 DIAGNOSIS — Z794 Long term (current) use of insulin: Secondary | ICD-10-CM | POA: Diagnosis not present

## 2016-05-28 DIAGNOSIS — E1129 Type 2 diabetes mellitus with other diabetic kidney complication: Secondary | ICD-10-CM | POA: Diagnosis not present

## 2016-05-28 DIAGNOSIS — I129 Hypertensive chronic kidney disease with stage 1 through stage 4 chronic kidney disease, or unspecified chronic kidney disease: Secondary | ICD-10-CM | POA: Diagnosis not present

## 2016-05-28 DIAGNOSIS — E669 Obesity, unspecified: Secondary | ICD-10-CM | POA: Diagnosis not present

## 2016-05-28 DIAGNOSIS — Z6831 Body mass index (BMI) 31.0-31.9, adult: Secondary | ICD-10-CM | POA: Diagnosis not present

## 2016-05-28 DIAGNOSIS — K829 Disease of gallbladder, unspecified: Secondary | ICD-10-CM | POA: Diagnosis not present

## 2016-05-28 DIAGNOSIS — N182 Chronic kidney disease, stage 2 (mild): Secondary | ICD-10-CM | POA: Diagnosis not present

## 2016-05-28 DIAGNOSIS — E784 Other hyperlipidemia: Secondary | ICD-10-CM | POA: Diagnosis not present

## 2016-06-09 DIAGNOSIS — E1159 Type 2 diabetes mellitus with other circulatory complications: Secondary | ICD-10-CM | POA: Diagnosis not present

## 2016-06-09 DIAGNOSIS — B351 Tinea unguium: Secondary | ICD-10-CM | POA: Diagnosis not present

## 2016-06-09 DIAGNOSIS — M2041 Other hammer toe(s) (acquired), right foot: Secondary | ICD-10-CM | POA: Diagnosis not present

## 2016-06-09 DIAGNOSIS — L84 Corns and callosities: Secondary | ICD-10-CM | POA: Diagnosis not present

## 2016-06-15 ENCOUNTER — Inpatient Hospital Stay (HOSPITAL_COMMUNITY)
Admission: EM | Admit: 2016-06-15 | Discharge: 2016-06-18 | DRG: 638 | Disposition: A | Discharge status: Home / Self Care | Payer: Medicare Other | Attending: Internal Medicine | Admitting: Internal Medicine

## 2016-06-15 ENCOUNTER — Encounter (HOSPITAL_COMMUNITY): Payer: Self-pay | Admitting: Emergency Medicine

## 2016-06-15 DIAGNOSIS — E1165 Type 2 diabetes mellitus with hyperglycemia: Secondary | ICD-10-CM | POA: Diagnosis not present

## 2016-06-15 DIAGNOSIS — N179 Acute kidney failure, unspecified: Secondary | ICD-10-CM | POA: Diagnosis present

## 2016-06-15 DIAGNOSIS — Z794 Long term (current) use of insulin: Secondary | ICD-10-CM

## 2016-06-15 DIAGNOSIS — Z87891 Personal history of nicotine dependence: Secondary | ICD-10-CM | POA: Diagnosis not present

## 2016-06-15 DIAGNOSIS — Z8249 Family history of ischemic heart disease and other diseases of the circulatory system: Secondary | ICD-10-CM

## 2016-06-15 DIAGNOSIS — G3184 Mild cognitive impairment, so stated: Secondary | ICD-10-CM | POA: Diagnosis present

## 2016-06-15 DIAGNOSIS — R739 Hyperglycemia, unspecified: Secondary | ICD-10-CM | POA: Diagnosis not present

## 2016-06-15 DIAGNOSIS — H9190 Unspecified hearing loss, unspecified ear: Secondary | ICD-10-CM | POA: Diagnosis present

## 2016-06-15 DIAGNOSIS — E876 Hypokalemia: Secondary | ICD-10-CM | POA: Diagnosis not present

## 2016-06-15 DIAGNOSIS — G4733 Obstructive sleep apnea (adult) (pediatric): Secondary | ICD-10-CM | POA: Diagnosis not present

## 2016-06-15 DIAGNOSIS — E1310 Other specified diabetes mellitus with ketoacidosis without coma: Secondary | ICD-10-CM | POA: Diagnosis not present

## 2016-06-15 DIAGNOSIS — R0602 Shortness of breath: Secondary | ICD-10-CM

## 2016-06-15 DIAGNOSIS — I10 Essential (primary) hypertension: Secondary | ICD-10-CM | POA: Diagnosis not present

## 2016-06-15 DIAGNOSIS — E7800 Pure hypercholesterolemia, unspecified: Secondary | ICD-10-CM | POA: Diagnosis present

## 2016-06-15 DIAGNOSIS — Z9989 Dependence on other enabling machines and devices: Secondary | ICD-10-CM

## 2016-06-15 DIAGNOSIS — R0682 Tachypnea, not elsewhere classified: Secondary | ICD-10-CM | POA: Diagnosis present

## 2016-06-15 DIAGNOSIS — E1110 Type 2 diabetes mellitus with ketoacidosis without coma: Secondary | ICD-10-CM | POA: Diagnosis present

## 2016-06-15 DIAGNOSIS — Z9641 Presence of insulin pump (external) (internal): Secondary | ICD-10-CM | POA: Diagnosis present

## 2016-06-15 DIAGNOSIS — R031 Nonspecific low blood-pressure reading: Secondary | ICD-10-CM | POA: Diagnosis not present

## 2016-06-15 LAB — URINALYSIS, ROUTINE W REFLEX MICROSCOPIC
BACTERIA UA: NONE SEEN
BILIRUBIN URINE: NEGATIVE
Glucose, UA: >=500 mg/dL — AB
HGB URINE DIPSTICK: NEGATIVE
Ketones, ur: 80 mg/dL — AB
LEUKOCYTES UA: NEGATIVE
Nitrite: NEGATIVE
PROTEIN: NEGATIVE mg/dL
Specific Gravity, Urine: 1.021 (ref 1.005–1.030)
pH: 5 (ref 5.0–8.0)

## 2016-06-15 LAB — BLOOD GAS, VENOUS
Acid-base deficit: 13.9 mmol/L — ABNORMAL HIGH (ref 0.0–2.0)
Bicarbonate: 12.1 mmol/L — ABNORMAL LOW (ref 20.0–28.0)
O2 Saturation: 76.2 %
Patient temperature: 98.6
pCO2, Ven: 29.5 mmHg — ABNORMAL LOW (ref 44.0–60.0)
pH, Ven: 7.238 — ABNORMAL LOW (ref 7.250–7.430)
pO2, Ven: 46.7 mmHg — ABNORMAL HIGH (ref 32.0–45.0)

## 2016-06-15 LAB — CBG MONITORING, ED
GLUCOSE-CAPILLARY: 588 mg/dL — AB (ref 65–99)
GLUCOSE-CAPILLARY: 562 mg/dL — AB (ref 65–99)
GLUCOSE-CAPILLARY: 575 mg/dL — AB (ref 65–99)
Glucose-Capillary: 563 mg/dL (ref 65–99)

## 2016-06-15 LAB — CBC
HEMATOCRIT: 40.5 % (ref 39.0–52.0)
Hemoglobin: 13.5 g/dL (ref 13.0–17.0)
MCH: 31 pg (ref 26.0–34.0)
MCHC: 33.3 g/dL (ref 30.0–36.0)
MCV: 93.1 fL (ref 78.0–100.0)
Platelets: 207 10*3/uL (ref 150–400)
RBC: 4.35 MIL/uL (ref 4.22–5.81)
RDW: 14.3 % (ref 11.5–15.5)
WBC: 13.3 10*3/uL — ABNORMAL HIGH (ref 4.0–10.5)

## 2016-06-15 LAB — GLUCOSE, CAPILLARY: Glucose-Capillary: >600 mg/dL (ref 65–99)

## 2016-06-15 MED ORDER — SODIUM CHLORIDE 0.9 % IV SOLN: INTRAVENOUS | Status: DC

## 2016-06-15 MED ORDER — ENOXAPARIN SODIUM 40 MG/0.4ML ~~LOC~~ SOLN
40.0000 mg | SUBCUTANEOUS | Status: DC
  Administered 2016-06-15 – 2016-06-17 (×3): 40 mg via SUBCUTANEOUS
  Filled 2016-06-15 (×3): qty 0.4

## 2016-06-15 MED ORDER — INSULIN REGULAR HUMAN 100 UNIT/ML IJ SOLN
INTRAMUSCULAR | Status: DC
  Administered 2016-06-15: 5 [IU]/h via INTRAVENOUS
  Filled 2016-06-15 (×2): qty 2.5

## 2016-06-15 MED ORDER — SODIUM CHLORIDE 0.9 % IV SOLN
INTRAVENOUS | Status: DC
  Administered 2016-06-15 (×2): via INTRAVENOUS

## 2016-06-15 MED ORDER — ROSUVASTATIN CALCIUM 20 MG PO TABS
20.0000 mg | ORAL_TABLET | Freq: Every day | ORAL | Status: DC
  Administered 2016-06-15 – 2016-06-17 (×3): 20 mg via ORAL
  Filled 2016-06-15 (×3): qty 1

## 2016-06-15 MED ORDER — DEXTROSE-NACL 5-0.45 % IV SOLN
INTRAVENOUS | Status: DC
  Administered 2016-06-15 – 2016-06-16 (×2): via INTRAVENOUS

## 2016-06-15 MED ORDER — SODIUM CHLORIDE 0.9 % IV SOLN
INTRAVENOUS | Status: AC
  Administered 2016-06-15: 22:00:00 via INTRAVENOUS

## 2016-06-15 MED ORDER — DEXTROSE-NACL 5-0.45 % IV SOLN: INTRAVENOUS | Status: DC

## 2016-06-15 MED ORDER — ONDANSETRON HCL 4 MG/2ML IJ SOLN
4.0000 mg | Freq: Once | INTRAMUSCULAR | Status: AC
  Administered 2016-06-15: 4 mg via INTRAVENOUS
  Filled 2016-06-15: qty 2

## 2016-06-15 NOTE — ED Notes (Signed)
Pt unable to urinate at this time.  

## 2016-06-15 NOTE — ED Notes (Signed)
Bed: WA05 Expected date:  Expected time:  Means of arrival:  Comments: EMS hyperglycemia 

## 2016-06-15 NOTE — ED Notes (Signed)
Patient has episode of projectile vomit. PA was present during episode. Pt cleaned, linens changed and zofran to be administered.

## 2016-06-15 NOTE — H&P (Addendum)
History and Physical    Derrick [name] [name] DOB: 07/16/1940 DOA: 06/15/2016   PCP: Geoffery Lyons, MD Chief Complaint:  Chief Complaint  Patient presents with  . Hyperglycemia    HPI: Derrick [name] is a 75 y.o. male with medical history significant of DM, insulin pump, prior DKA, OSA, HTN.  Patient presents to the ED with 1 day history of vomiting, confusion, lethargy, very high BGLs.  Per insulin pump hasn't bolused insulin since yesterday.  BGLs running as high as 500s this morning.  No fever, chills, cough, abd pain, rash, skin ulcer, headache, or other obvious underlying illness.  ED Course: Has DKA.  UA pending.  Review of Systems: As per HPI otherwise 10 point review of systems negative.    Past Medical History:  Diagnosis Date  . Cataract   . Complication of anesthesia    "high blood sugar; he's been loopy last 36h since OR"- 2016  . Diabetes mellitus   . DKA, type 2 ([place])   . Hearing loss   . Hypercholesteremia   . Hypertension   . Insulin pump in place   . Insulin pump in place   . Macular degeneration   . MCI (mild cognitive impairment) with memory loss 12/12/2014  . Obstructive sleep apnea   . OSA on CPAP 09/11/2014  . PONV (postoperative nausea and vomiting)   . Sleep apnea    cpap  . Uses hearing aid    right ear, patient stated he lost left hearing aid     Past Surgical History:  Procedure Laterality Date  . CHOLECYSTECTOMY N/A 10/28/2015   Procedure: LAPAROSCOPIC CHOLECYSTECTOMY WITH   INTRAOPERATIVE CHOLANGIOGRAM;  Surgeon: Greer Pickerel, MD;  Location: [facility];  Service: General;  Laterality: N/A;  . SHOULDER ARTHROSCOPY Right 08/24/11   "frozen shoulder; RCR w/labrum tear; arthritis scraping; bone spurs"  . SHOULDER ARTHROSCOPY Left ~ 2006  . TONSILLECTOMY  1949   "in childhood"     reports that he quit smoking about 33 years ago. His smoking use included Cigarettes. He has a 3.75 pack-year smoking history. He has never used  smokeless tobacco. He reports that he drinks alcohol. He reports that he does not use drugs.  No Known Allergies  Family History  Problem Relation Age of Onset  . Heart disease Father   . High blood pressure Father   . Colon cancer Neg Hx       Prior to Admission medications   Medication Sig Start Date End Date Taking? Authorizing Provider  amLODipine (NORVASC) 10 MG tablet Take 10 mg by mouth daily. Patient takes in the am    Historical Provider, MD  donepezil (ARICEPT) 5 MG tablet Take 5 mg by mouth daily. Patient takes in the pm 05/13/15   Historical Provider, MD  insulin aspart (NOVOLOG) 100 UNIT/ML injection Inject 0-15 Units into the skin 3 (three) times daily with meals. Check blood sugars every 4 hours while awake and use the Humalog pen: Greater than 350 give 12 units, 251-350 give 8 units, 151-250 give 4 units. 08/28/11 10/28/15  Burnard Bunting, MD  Insulin Human (INSULIN PUMP) SOLN Inject into the skin. Patient does not know basal rate - have requested info from office of Dr [name] on 10/25/15.    Historical Provider, MD  labetalol (NORMODYNE) 200 MG tablet Take 200 mg by mouth 2 (two) times daily. 07/08/15   Historical Provider, MD  lisinopril-hydrochlorothiazide (PRINZIDE,ZESTORETIC) 20-12.5 MG per tablet Take 1 tablet by mouth daily.  Historical Provider, MD  Multiple Vitamins-Minerals (ICAPS AREDS FORMULA PO) Take 1 tablet by mouth 2 (two) times daily.    Historical Provider, MD  naproxen sodium (ANAPROX) 220 MG tablet Take 220 mg by mouth 2 (two) times daily as needed (pain). Patient takes nightly for back pain per patient    Historical Provider, MD  oxyCODONE (OXY IR/ROXICODONE) 5 MG immediate release tablet Take 1-2 tablets (5-10 mg total) by mouth every 4 (four) hours as needed for moderate pain. 10/29/15   Greer Pickerel, MD  Polyethyl Glycol-Propyl Glycol 0.4-0.3 % SOLN Apply 1 drop to eye daily as needed (dry eyes).     Historical Provider, MD  rosuvastatin (CRESTOR) 20 MG  tablet Take 20 mg by mouth daily.    Historical Provider, MD    Physical Exam: Vitals:   06/15/16 1805 06/15/16 1819 06/15/16 1955  BP:  (!) 125/48 (!) 88/37  Pulse:  93 89  Resp:  22 22  Temp:  98.3 F (36.8 C)   TempSrc:  Oral   SpO2:  98% 98%  Weight: 81.6 kg (180 lb)    Height: 6\' 2"  (1.88 m)        Constitutional: NAD, calm, comfortable, sleepy Eyes: PERRL, lids and conjunctivae normal ENMT: Mucous membranes are moist. Posterior pharynx clear of any exudate or lesions.Normal dentition.  Neck: normal, supple, no masses, no thyromegaly Respiratory: clear to auscultation bilaterally, no wheezing, no crackles. Normal respiratory effort. No accessory muscle use.  Cardiovascular: Regular rate and rhythm, no murmurs / rubs / gallops. No extremity edema. 2+ pedal pulses. No carotid bruits.  Abdomen: no tenderness, no masses palpated. No hepatosplenomegaly. Bowel sounds positive.  Musculoskeletal: no clubbing / cyanosis. No joint deformity upper and lower extremities. Good ROM, no contractures. Normal muscle tone.  Skin: no rashes, lesions, ulcers. No induration, no foot ulcers Neurologic: CN 2-12 grossly intact. Sensation intact, DTR normal. Strength 5/5 in all 4.  Psychiatric: Normal judgment and insight. Alert and oriented x 3. Normal mood.    Labs on Admission: I have personally reviewed following labs and imaging studies  CBC:  Recent Labs Lab 06/15/16 1827  WBC 13.3*  HGB 13.5  HCT 40.5  MCV 93.1  PLT [id number]   Basic Metabolic Panel:  Recent Labs Lab 06/15/16 1827  NA 138  K 4.6  CL 101  CO2 13*  GLUCOSE 630*  BUN 43*  CREATININE 1.99*  CALCIUM 8.8*   GFR: Estimated Creatinine Clearance: 38.2 mL/min (by C-G formula based on SCr of 1.99 mg/dL (H)). Liver Function Tests: No results for input(s): AST, ALT, ALKPHOS, BILITOT, PROT, ALBUMIN in the last 168 hours. No results for input(s): LIPASE, AMYLASE in the last 168 hours. No results for input(s): AMMONIA in  the last 168 hours. Coagulation Profile: No results for input(s): INR, PROTIME in the last 168 hours. Cardiac Enzymes: No results for input(s): CKTOTAL, CKMB, CKMBINDEX, TROPONINI in the last 168 hours. BNP (last 3 results) No results for input(s): PROBNP in the last 8760 hours. HbA1C: No results for input(s): HGBA1C in the last 72 hours. CBG:  Recent Labs Lab 06/15/16 1808 06/15/16 2000  GLUCAP 588* 562*   Lipid Profile: No results for input(s): CHOL, HDL, LDLCALC, TRIG, CHOLHDL, LDLDIRECT in the last 72 hours. Thyroid Function Tests: No results for input(s): TSH, T4TOTAL, FREET4, T3FREE, THYROIDAB in the last 72 hours. Anemia Panel: No results for input(s): VITAMINB12, FOLATE, FERRITIN, TIBC, IRON, RETICCTPCT in the last 72 hours. Urine analysis:    Component Value  Date/Time   COLORURINE YELLOW 08/25/2011 [place] 08/25/2011 1126   LABSPEC 1.028 08/25/2011 1126   PHURINE 5.5 08/25/2011 1126   GLUCOSEU >1000 (A) 08/25/2011 1126   HGBUR NEGATIVE 08/25/2011 1126   BILIRUBINUR NEGATIVE 08/25/2011 1126   KETONESUR >80 (A) 08/25/2011 1126   PROTEINUR NEGATIVE 08/25/2011 1126   UROBILINOGEN 0.2 08/25/2011 1126   NITRITE NEGATIVE 08/25/2011 1126   LEUKOCYTESUR NEGATIVE 08/25/2011 1126   Sepsis Labs: @LABRCNTIP (procalcitonin:4,lacticidven:4) )No results found for this or any previous visit (from the past 240 hour(s)).   Radiological Exams on Admission: No results found.  EKG: Independently reviewed.  Assessment/Plan Principal Problem:   DKA (diabetic ketoacidosis) ([place]) Active Problems:   OSA on CPAP   HTN (hypertension)   AKI (acute kidney injury) ([place])    1. DKA - 1. DKA pathway 2. Insulin gtt 3. IVF bolused in ED 4. Q4H BMPs 5. At this point dosent appear to have obvious underlying illness 1. UA pending 2. AKI - likely pre-renal due to DKA 1. IVF 2. Serial BMPs and monitor UOP 3. UA pending 4. Further work up if doesn't resolve 3. HTN  - 1. Holding BP meds due to pre-renal AKI 2. If BP elevates resume labetalol and amlodipine first, continuing to hold lisinopril-HCTZ 4. OSA - 1. Holding CPAP due to vomiting and lethargy, resume when he wakes up a bit more when DKA improved.   DVT prophylaxis: Lovenox Code Status: Full Family Communication: Family at bedside Consults called: None Admission status: Admit to inpatient   Etta Quill DO [facility] Pager [phone] from 7PM-7AM  If 7AM-7PM, please contact the day physician for the patient [email or web address] Password [facility]  06/15/2016, 8:18 PM

## 2016-06-15 NOTE — ED Provider Notes (Signed)
Medical screening examination/treatment/procedure(s) were conducted as a shared visit with non-physician practitioner(s) and myself.  I personally evaluated the patient during the encounter.   EKG Interpretation None     75 year old male with history of diabetes presents with vomiting and low sugars that have been out of control. Has insulin pump. Labs consistent with DKA. Treatment started and patient to be admitted   Lacretia Leigh, MD 06/15/16 2002

## 2016-06-15 NOTE — ED Provider Notes (Signed)
[facility] DEPT Provider Note   CSN: [id number] Arrival date & time: 06/15/16  1751     History   Chief Complaint Chief Complaint  Patient presents with  . Hyperglycemia    HPI Derrick [name] is a 75 y.o. male with history of diabetes with insulin pump who presents with hyperglycemia, altered mental status, nausea and vomiting. Patient has had difficulty controlling his glucose levels over the past week and patient became very confused last evening according to family members. Patient began vomiting today. Patient denies any chest pain, shortness of breath, urinary symptoms.  HPI  Past Medical History:  Diagnosis Date  . Cataract   . Complication of anesthesia    "high blood sugar; he's been loopy last 36h since OR"- 2016  . Diabetes mellitus   . DKA, type 2 ([place])   . Hearing loss   . Hypercholesteremia   . Hypertension   . Insulin pump in place   . Insulin pump in place   . Macular degeneration   . MCI (mild cognitive impairment) with memory loss 12/12/2014  . Obstructive sleep apnea   . OSA on CPAP 09/11/2014  . PONV (postoperative nausea and vomiting)   . Sleep apnea    cpap  . Uses hearing aid    right ear, patient stated he lost left hearing aid     Patient Active Problem List   Diagnosis Date Noted  . DKA (diabetic ketoacidosis) ([place]) 06/15/2016  . AKI (acute kidney injury) ([place]) 06/15/2016  . Diabetes mellitus ([place]) 10/29/2015  . HTN (hypertension) 10/29/2015  . S/P laparoscopic cholecystectomy 10/28/2015  . OSA on CPAP 09/11/2014    Past Surgical History:  Procedure Laterality Date  . CHOLECYSTECTOMY N/A 10/28/2015   Procedure: LAPAROSCOPIC CHOLECYSTECTOMY WITH   INTRAOPERATIVE CHOLANGIOGRAM;  Surgeon: Greer Pickerel, MD;  Location: [facility];  Service: General;  Laterality: N/A;  . SHOULDER ARTHROSCOPY Right 08/24/11   "frozen shoulder; RCR w/labrum tear; arthritis scraping; bone spurs"  . SHOULDER ARTHROSCOPY Left ~ 2006  . TONSILLECTOMY  1949    "in childhood"       Home Medications    Prior to Admission medications   Medication Sig Start Date End Date Taking? Authorizing Provider  amLODipine (NORVASC) 10 MG tablet Take 10 mg by mouth daily. Patient takes in the am    Historical Provider, MD  donepezil (ARICEPT) 5 MG tablet Take 5 mg by mouth daily. Patient takes in the pm 05/13/15   Historical Provider, MD  insulin aspart (NOVOLOG) 100 UNIT/ML injection Inject 0-15 Units into the skin 3 (three) times daily with meals. Check blood sugars every 4 hours while awake and use the Humalog pen: Greater than 350 give 12 units, 251-350 give 8 units, 151-250 give 4 units. 08/28/11 10/28/15  Burnard Bunting, MD  Insulin Human (INSULIN PUMP) SOLN Inject into the skin. Patient does not know basal rate - have requested info from office of Dr [name] on 10/25/15.    Historical Provider, MD  labetalol (NORMODYNE) 200 MG tablet Take 200 mg by mouth 2 (two) times daily. 07/08/15   Historical Provider, MD  lisinopril-hydrochlorothiazide (PRINZIDE,ZESTORETIC) 20-12.5 MG per tablet Take 1 tablet by mouth daily.    Historical Provider, MD  Multiple Vitamins-Minerals (ICAPS AREDS FORMULA PO) Take 1 tablet by mouth 2 (two) times daily.    Historical Provider, MD  naproxen sodium (ANAPROX) 220 MG tablet Take 220 mg by mouth 2 (two) times daily as needed (pain). Patient takes nightly for back pain per  patient    Historical Provider, MD  oxyCODONE (OXY IR/ROXICODONE) 5 MG immediate release tablet Take 1-2 tablets (5-10 mg total) by mouth every 4 (four) hours as needed for moderate pain. 10/29/15   Greer Pickerel, MD  Polyethyl Glycol-Propyl Glycol 0.4-0.3 % SOLN Apply 1 drop to eye daily as needed (dry eyes).     Historical Provider, MD  rosuvastatin (CRESTOR) 20 MG tablet Take 20 mg by mouth daily.    Historical Provider, MD    Family History Family History  Problem Relation Age of Onset  . Heart disease Father   . High blood pressure Father   . Colon cancer Neg Hx      Social History Social History  Substance Use Topics  . Smoking status: Former Smoker    Packs/day: 0.25    Years: 15.00    Types: Cigarettes    Quit date: 06/23/1983  . Smokeless tobacco: Never Used  . Alcohol use 0.0 oz/week     Comment: minimal     Allergies   Patient has no known allergies.   Review of Systems Review of Systems  Constitutional: Negative for chills and fever.  HENT: Negative for facial swelling and sore throat.   Respiratory: Negative for shortness of breath.   Cardiovascular: Negative for chest pain.  Gastrointestinal: Positive for abdominal pain, nausea and vomiting.  Genitourinary: Negative for dysuria.  Musculoskeletal: Negative for back pain.  Skin: Negative for rash and wound.  Neurological: Negative for headaches.  Psychiatric/Behavioral: The patient is not nervous/anxious.      Physical Exam Updated Vital Signs BP (!) 88/37 (BP Location: Left Arm)   Pulse 89   Temp 98.3 F (36.8 C) (Oral)   Resp 22   Ht 6\' 2"  (1.88 m)   Wt 81.6 kg   SpO2 98%   BMI 23.11 kg/m   Physical Exam  Constitutional: He appears well-developed and well-nourished. No distress.  Patient with residual, yellow emesis around the mouth and on chest  HENT:  Head: Normocephalic and atraumatic.  Mouth/Throat: Oropharynx is clear and moist. Mucous membranes are dry. No oropharyngeal exudate.  Eyes: Conjunctivae are normal. Pupils are equal, round, and reactive to light. Right eye exhibits no discharge. Left eye exhibits no discharge. No scleral icterus.  Neck: Normal range of motion. Neck supple. No thyromegaly present.  Cardiovascular: Normal rate, regular rhythm, normal heart sounds and intact distal pulses.  Exam reveals no gallop and no friction rub.   No murmur heard. Pulmonary/Chest: Effort normal and breath sounds normal. No stridor. No respiratory distress. He has no wheezes. He has no rales.  Abdominal: Soft. Bowel sounds are normal. He exhibits no  distension. There is no tenderness. There is no rebound and no guarding.  Musculoskeletal: He exhibits no edema.  Lymphadenopathy:    He has no cervical adenopathy.  Neurological: He is alert. Coordination normal.  Skin: Skin is warm and dry. No rash noted. He is not diaphoretic. No pallor.  Psychiatric: He has a normal mood and affect.  Nursing note and vitals reviewed.    ED Treatments / Results  Labs (all labs ordered are listed, but only abnormal results are displayed) Labs Reviewed  BASIC METABOLIC PANEL - Abnormal; Notable for the following:       Result Value   CO2 13 (*)    Glucose, Bld 630 (*)    BUN 43 (*)    Creatinine, Ser 1.99 (*)    Calcium 8.8 (*)    GFR calc non Af  Amer 32 (*)    GFR calc Af Amer 37 (*)    Anion gap 24 (*)    All other components within normal limits  CBC - Abnormal; Notable for the following:    WBC 13.3 (*)    All other components within normal limits  BLOOD GAS, VENOUS - Abnormal; Notable for the following:    pH, Ven 7.238 (*)    pCO2, Ven 29.5 (*)    pO2, Ven 46.7 (*)    Bicarbonate 12.1 (*)    Acid-base deficit 13.9 (*)    All other components within normal limits  CBG MONITORING, ED - Abnormal; Notable for the following:    Glucose-Capillary 588 (*)    All other components within normal limits  CBG MONITORING, ED - Abnormal; Notable for the following:    Glucose-Capillary 562 (*)    All other components within normal limits  URINALYSIS, ROUTINE W REFLEX MICROSCOPIC  BASIC METABOLIC PANEL  BASIC METABOLIC PANEL  BASIC METABOLIC PANEL    EKG  EKG Interpretation  Date/Time:  Monday June 15 2016 20:12:50 EST Ventricular Rate:  93 PR Interval:    QRS Duration: 120 QT Interval:  392 QTC Calculation: 488 R Axis:   -64 Text Interpretation:  Accelerated junctional rhythm Left anterior fascicular block LVH with secondary repolarization abnormality Anterior Q waves, possibly due to LVH Confirmed by ALLEN  MD, ANTHONY ([id number])  on 06/15/2016 8:20:28 PM       Radiology No results found.  Procedures Procedures (including critical care time)  Medications Ordered in ED Medications  insulin regular (NOVOLIN R,HUMULIN R) 250 Units in sodium chloride 0.9 % 250 mL (1 Units/mL) infusion (not administered)  0.9 %  sodium chloride infusion (not administered)  0.9 %  sodium chloride infusion (not administered)  dextrose 5 %-0.45 % sodium chloride infusion (not administered)  enoxaparin (LOVENOX) injection 40 mg (not administered)  ondansetron (ZOFRAN) injection 4 mg (4 mg Intravenous Given 06/15/16 1855)     Initial Impression / Assessment and Plan / ED Course  I have reviewed the triage vital signs and the nursing notes.  Pertinent labs & imaging results that were available during my care of the patient were reviewed by me and considered in my medical decision making (see chart for details).  Clinical Course     Patient projectile vomited yellow emesis while is in the room during my H&P. Patient cleaned up and changed sheet in gown with assistance of nursing staff.  Low blood pressures in the ED are incorrect due to too large blood pressure cuff. Most recent at 2010-115/59  Patient in DKA. VBG shows pH 7.238, CO2 29.5. CBC shows WBC 13.3. BMP shows bicarbonate 13, glucose 6:30, BUN 43, creatinine 1.99, calcium 8.8. EKG shows accelerated junctional rhythm, left anterior fascicular block, LVH. Fluids and glucose stabilizer initiated in the ED. Zofran given for nausea and vomiting. I consulted hospitalist, Dr. Alcario Drought, who will admit the patient to SDU for further evaluation and treatment. Patient also evaluated by Dr. Zenia Resides who guided the patient's management and agrees with plan.  Final Clinical Impressions(s) / ED Diagnoses   Final diagnoses:  Diabetic ketoacidosis without coma associated with type 2 diabetes mellitus [facility])    New Prescriptions New Prescriptions   No medications on file         Frederica Kuster, PA-C 06/15/16 2021

## 2016-06-15 NOTE — ED Triage Notes (Signed)
Per EMS, pt has not felt well today and has not been able to keep blood sugar under control.  Pt was a little lethargic upon EMS arrival and alert/oriented x 3.  Pt has had one instance of emesis but denies nausea or pain at this time.  Pt has an insulin pump and takes insulin shots as well.  18G in LAC with normal saline going.   BP: 128/56 HR: 90 RR: 16 94% on RA

## 2016-06-16 DIAGNOSIS — E876 Hypokalemia: Secondary | ICD-10-CM

## 2016-06-16 DIAGNOSIS — N179 Acute kidney failure, unspecified: Secondary | ICD-10-CM

## 2016-06-16 LAB — BASIC METABOLIC PANEL
ANION GAP: 15 (ref 5–15)
ANION GAP: 9 (ref 5–15)
Anion gap: 20 — ABNORMAL HIGH (ref 5–15)
Anion gap: 24 — ABNORMAL HIGH (ref 5–15)
Anion gap: 7 (ref 5–15)
BUN: 43 mg/dL — AB (ref 6–20)
BUN: 48 mg/dL — ABNORMAL HIGH (ref 6–20)
BUN: 48 mg/dL — ABNORMAL HIGH (ref 6–20)
BUN: 49 mg/dL — AB (ref 6–20)
BUN: 50 mg/dL — ABNORMAL HIGH (ref 6–20)
CALCIUM: 7.9 mg/dL — AB (ref 8.9–10.3)
CALCIUM: 7.9 mg/dL — AB (ref 8.9–10.3)
CALCIUM: 8 mg/dL — AB (ref 8.9–10.3)
CHLORIDE: 101 mmol/L (ref 101–111)
CHLORIDE: 112 mmol/L — AB (ref 101–111)
CO2: 13 mmol/L — AB (ref 22–32)
CO2: 12 mmol/L — AB (ref 22–32)
CO2: 15 mmol/L — AB (ref 22–32)
CO2: 19 mmol/L — AB (ref 22–32)
CO2: 20 mmol/L — AB (ref 22–32)
CREATININE: 2.04 mg/dL — AB (ref 0.61–1.24)
CREATININE: 2.17 mg/dL — AB (ref 0.61–1.24)
CREATININE: 1.81 mg/dL — AB (ref 0.61–1.24)
Calcium: 8.8 mg/dL — ABNORMAL LOW (ref 8.9–10.3)
Calcium: 7.9 mg/dL — ABNORMAL LOW (ref 8.9–10.3)
Chloride: 104 mmol/L (ref 101–111)
Chloride: 107 mmol/L (ref 101–111)
Chloride: 109 mmol/L (ref 101–111)
Creatinine, Ser: 1.99 mg/dL — ABNORMAL HIGH (ref 0.61–1.24)
Creatinine, Ser: 1.82 mg/dL — ABNORMAL HIGH (ref 0.61–1.24)
GFR calc Af Amer: 33 mL/min — ABNORMAL LOW (ref >60)
GFR calc Af Amer: 36 mL/min — ABNORMAL LOW (ref >60)
GFR calc Af Amer: 37 mL/min — ABNORMAL LOW (ref >60)
GFR calc Af Amer: 41 mL/min — ABNORMAL LOW (ref >60)
GFR calc non Af Amer: 28 mL/min — ABNORMAL LOW (ref >60)
GFR calc non Af Amer: 31 mL/min — ABNORMAL LOW (ref >60)
GFR calc non Af Amer: 32 mL/min — ABNORMAL LOW (ref >60)
GFR calc non Af Amer: 35 mL/min — ABNORMAL LOW (ref >60)
GFR calc non Af Amer: 35 mL/min — ABNORMAL LOW (ref >60)
GFR, EST AFRICAN AMERICAN: 41 mL/min — AB (ref >60)
GLUCOSE: 326 mg/dL — AB (ref 65–99)
GLUCOSE: 545 mg/dL — AB (ref 65–99)
GLUCOSE: 672 mg/dL — AB (ref 65–99)
Glucose, Bld: 630 mg/dL (ref 65–99)
Glucose, Bld: 152 mg/dL — ABNORMAL HIGH (ref 65–99)
POTASSIUM: 4.6 mmol/L (ref 3.5–5.1)
POTASSIUM: 3.1 mmol/L — AB (ref 3.5–5.1)
Potassium: 3.1 mmol/L — ABNORMAL LOW (ref 3.5–5.1)
Potassium: 3.5 mmol/L (ref 3.5–5.1)
Potassium: 3.6 mmol/L (ref 3.5–5.1)
SODIUM: 138 mmol/L (ref 135–145)
SODIUM: 139 mmol/L (ref 135–145)
Sodium: 136 mmol/L (ref 135–145)
Sodium: 137 mmol/L (ref 135–145)
Sodium: 137 mmol/L (ref 135–145)

## 2016-06-16 LAB — GLUCOSE, CAPILLARY
GLUCOSE-CAPILLARY: 106 mg/dL — AB (ref 65–99)
GLUCOSE-CAPILLARY: 126 mg/dL — AB (ref 65–99)
GLUCOSE-CAPILLARY: 204 mg/dL — AB (ref 65–99)
GLUCOSE-CAPILLARY: 217 mg/dL — AB (ref 65–99)
GLUCOSE-CAPILLARY: 464 mg/dL — AB (ref 65–99)
GLUCOSE-CAPILLARY: 537 mg/dL — AB (ref 65–99)
GLUCOSE-CAPILLARY: 545 mg/dL — AB (ref 65–99)
GLUCOSE-CAPILLARY: 591 mg/dL — AB (ref 65–99)
GLUCOSE-CAPILLARY: 87 mg/dL (ref 65–99)
GLUCOSE-CAPILLARY: 99 mg/dL (ref 65–99)
Glucose-Capillary: 478 mg/dL — ABNORMAL HIGH (ref 65–99)
Glucose-Capillary: 366 mg/dL — ABNORMAL HIGH (ref 65–99)
Glucose-Capillary: 275 mg/dL — ABNORMAL HIGH (ref 65–99)
Glucose-Capillary: 293 mg/dL — ABNORMAL HIGH (ref 65–99)
Glucose-Capillary: 246 mg/dL — ABNORMAL HIGH (ref 65–99)
Glucose-Capillary: 207 mg/dL — ABNORMAL HIGH (ref 65–99)
Glucose-Capillary: 175 mg/dL — ABNORMAL HIGH (ref 65–99)

## 2016-06-16 LAB — MRSA PCR SCREENING: MRSA BY PCR: NEGATIVE

## 2016-06-16 MED ORDER — POTASSIUM CHLORIDE 10 MEQ/100ML IV SOLN
10.0000 meq | INTRAVENOUS | Status: AC
  Administered 2016-06-16 (×3): 10 meq via INTRAVENOUS
  Filled 2016-06-16 (×3): qty 100

## 2016-06-16 MED ORDER — SODIUM CHLORIDE 0.9 % IV SOLN: 30.0000 meq | Freq: Once | INTRAVENOUS | Status: DC

## 2016-06-16 MED ORDER — INSULIN ASPART 100 UNIT/ML ~~LOC~~ SOLN
0.0000 [IU] | Freq: Every day | SUBCUTANEOUS | Status: DC
  Administered 2016-06-16 – 2016-06-17 (×2): 2 [IU] via SUBCUTANEOUS

## 2016-06-16 MED ORDER — INSULIN ASPART PROT & ASPART (70-30 MIX) 100 UNIT/ML ~~LOC~~ SUSP
22.0000 [IU] | Freq: Two times a day (BID) | SUBCUTANEOUS | Status: DC
  Administered 2016-06-16: 22 [IU] via SUBCUTANEOUS
  Filled 2016-06-16: qty 10

## 2016-06-16 MED ORDER — INSULIN STARTER KIT- SYRINGES (ENGLISH): 1.0000 | Freq: Once | Status: DC

## 2016-06-16 MED ORDER — INSULIN STARTER KIT- PEN NEEDLES (ENGLISH)
1.0000 | Freq: Once | Status: AC
  Administered 2016-06-16: 1
  Filled 2016-06-16: qty 1

## 2016-06-16 MED ORDER — INSULIN ASPART 100 UNIT/ML ~~LOC~~ SOLN
0.0000 [IU] | Freq: Three times a day (TID) | SUBCUTANEOUS | Status: DC
  Administered 2016-06-17 (×3): 8 [IU] via SUBCUTANEOUS
  Administered 2016-06-18: 5 [IU] via SUBCUTANEOUS
  Administered 2016-06-18 (×2): 8 [IU] via SUBCUTANEOUS

## 2016-06-16 MED ORDER — INSULIN STARTER KIT- SYRINGES (ENGLISH)
1.0000 | Freq: Once | Status: AC
  Administered 2016-06-16: 1
  Filled 2016-06-16: qty 1

## 2016-06-16 NOTE — Significant Event (Signed)
Called by ICU RN: Informed that prior to patients arrival in ICU he apparently has received a full 1L of D5 half apparently in bolus (obviously patient received this in error).  BGL now > 600.  BMP pending.

## 2016-06-16 NOTE — Progress Notes (Signed)
Taught wife how to find educational videos, husband not completely back to his old self so teaching geared to wife. Wife states she uses a pen for Lantus at home. Kits provided into room. More education to be done when patient wakes up.

## 2016-06-16 NOTE — Progress Notes (Signed)
Wife wishes to wait on education until patient awake, patient easily arousable but drifts off back to sleep. Will continue to re-assess. All educational materials at bedside.

## 2016-06-16 NOTE — Progress Notes (Addendum)
Inpatient Diabetes Program Recommendations  AACE/ADA: New Consensus Statement on Inpatient Glycemic Control (2015)  Target Ranges:  Prepandial:   less than 140 mg/dL      Peak postprandial:   less than 180 mg/dL (1-2 hours)      Critically ill patients:  140 - 180 mg/dL   Lab Results  Component Value Date   GLUCAP 366 (H) 06/16/2016   HGBA1C 8.3 (H) 10/28/2015    Review of Glycemic Control  Inpatient Diabetes Program Recommendations:  Noted patient currently on IV insulin drip. Patient will need IV insulin drip until acidosis clears (CO2 20 or greater and anion gap 10-12 or less) When patient meets criteria for transition back to insulin pump, will need to restart with new injection site and restart insulin pump 2 hrs. Prior to discontinuing insulin drip. Patient will need Insulin Pump Order Set-patient to use own supplies. Have patient sign contract and give CHO worksheet for pt. To write down his boluses.  Note from DM Coordinator while patient admitted in May 2017: Pump rates: 12 AM - .7, 4 am - 1.1, 8 am 1.4, 5 pm 1.9 - Total basal 33.1 units.  Spoke with patient's daughter Cloyde Reams by phone. Daughter expressed concern that patient's wife and children uncertain how often patient changes his site or boluses for his meal coverage.Daughter is discussing with wife options between switching to insulin coverage. Please see Dr. Jacquiline Doe note regarding recommendations to come off of pump and transition to insulin injections. 70/30 insulin bid (could start with 22 units bid ac breakfast & dinner) Diabetes Coordinator not on campus today. Nurses please start wife giving injections while in the hospital and start teaching pen or syringe depending on preference of wife. Will follow.  Thank you, Nani Gasser. Iretha Kirley, RN, MSN, CDE Inpatient Glycemic Control Team Team Pager [phone] (8am-5pm) 06/16/2016 7:50 AM

## 2016-06-16 NOTE — Progress Notes (Signed)
PROGRESS NOTE    Derrick [name]  [id number] DOB: 1941-01-22 DOA: 06/15/2016 PCP: Geoffery Lyons, MD   Brief Narrative: 75 y.o. male with medical history significant of DM on insulin pump, prior DKA, OSA, HTN.  Patient presents to the ED with 1 day of vomiting, confusion, lethargy, very high BGLs. Blood sugar was running around 500 at home. Assessment & Plan:  # Diabetic ketoacidosis without coma: -Discussed with the patient and his wife at bedside. The patient is having difficulty using insulin pump and bolus if blood sugar level are high. Patient likely will transition to subcutaneous insulin on discharge. -Continue insulin drip, IV fluid. Blood sugar are better controlled and anion gap is closed. I'm starting insulin 70/30,  22 units twice a day with sliding scale and bedtime insulin. Try to wean off insulin drip after overlap. -Diabetic educator consulted -Patient and family likely needs education regarding injection, blood sugar monitoring -Diabetic diet  #Acute kidney injury likely in the setting of DKA and dehydration: Serum creatinine level already trending down to 1.8. Continue IV fluid. Patient does not have urinary symptoms except polyuria. UA negative for UTI.  #Worsening confusion likely acute encephalopathy versus worsening patient's memory disorder: Exactly unknown if patient has underlying memory related problem including dementia. As per wife, his confusion has worsened since yesterday. Today after treatment with insulin and IV fluid his mental status is significantly improving. No focal neurological deficit. Continue to monitor.  #Hypertension: Blood pressure acceptable. Home medication on hold. Continue to monitor.  #Hypokalemia: In the setting of DKA. Repleted potassium chloride. Monitor labs.  Principal Problem:   DKA (diabetic ketoacidosis) ([place]) Active Problems:   OSA on CPAP   HTN (hypertension)   AKI (acute kidney injury) ([place])  DVT prophylaxis:  Lovenox subcutaneous Code Status: Full code Family Communication: Patient's wife at bedside Disposition Plan: Likely discharge home in 1-2 days    Consultants:   None  Procedures: None Antimicrobials: None  Subjective: Patient was seen and examined at bedside. Patient is alert awake and oriented to name hospital and December. As per patient's wife his mental status significantly improving. Denied headache, dizziness, abdominal pain. Reported some nausea but denied vomiting. No diarrhea or constipation.  Objective: Vitals:   06/16/16 0600 06/16/16 0700 06/16/16 0800 06/16/16 1200  BP: (!) 113/42 (!) 114/38 (!) 116/41   Pulse: 89 86 87   Resp: (!) 26 20 (!) 25   Temp:  98.1 F (36.7 C)  98.1 F (36.7 C)  TempSrc:      SpO2: 95% 95% 95%   Weight:      Height:        Intake/Output Summary (Last 24 hours) at 06/16/16 1304 Last data filed at 06/16/16 0800  Gross per 24 hour  Intake          1507.28 ml  Output                0 ml  Net          1507.28 ml   Filed Weights   06/15/16 1805  Weight: 81.6 kg (180 lb)    Examination:  General exam: Appears calm and comfortable  Respiratory system: Clear to auscultation. Respiratory effort normal. No wheezing or crackle Cardiovascular system: S1 & S2 heard, RRR.  No pedal edema. Gastrointestinal system: Abdomen is nondistended, soft and nontender. Normal bowel sounds heard. Central nervous system: Alert and oriented. No focal neurological deficits. Extremities: Symmetric 5 x 5 power. Skin: No rashes, lesions or ulcers  Psychiatry: Judgement and insight appear Impaired. Mood & affect flat.     Data Reviewed: I have personally reviewed following labs and imaging studies  CBC:  Recent Labs Lab 06/15/16 1827  WBC 13.3*  HGB 13.5  HCT 40.5  MCV 93.1  PLT 801   Basic Metabolic Panel:  Recent Labs Lab 06/15/16 1827 06/15/16 2344 06/16/16 0301 06/16/16 0805  NA 138 136 137 137  K 4.6 3.6 3.5 3.1*  CL 101 104 107  109  CO2 13* 12* 15* 19*  GLUCOSE 630* 672* 545* 326*  BUN 43* 48* 48* 50*  CREATININE 1.99* 2.17* 2.04* 1.82*  CALCIUM 8.8* 7.9* 8.0* 7.9*   GFR: Estimated Creatinine Clearance: 41.7 mL/min (by C-G formula based on SCr of 1.82 mg/dL (H)). Liver Function Tests: No results for input(s): AST, ALT, ALKPHOS, BILITOT, PROT, ALBUMIN in the last 168 hours. No results for input(s): LIPASE, AMYLASE in the last 168 hours. No results for input(s): AMMONIA in the last 168 hours. Coagulation Profile: No results for input(s): INR, PROTIME in the last 168 hours. Cardiac Enzymes: No results for input(s): CKTOTAL, CKMB, CKMBINDEX, TROPONINI in the last 168 hours. BNP (last 3 results) No results for input(s): PROBNP in the last 8760 hours. HbA1C: No results for input(s): HGBA1C in the last 72 hours. CBG:  Recent Labs Lab 06/16/16 0735 06/16/16 0823 06/16/16 0923 06/16/16 1042 06/16/16 1132  GLUCAP 275* 293* 246* 207* 175*   Lipid Profile: No results for input(s): CHOL, HDL, LDLCALC, TRIG, CHOLHDL, LDLDIRECT in the last 72 hours. Thyroid Function Tests: No results for input(s): TSH, T4TOTAL, FREET4, T3FREE, THYROIDAB in the last 72 hours. Anemia Panel: No results for input(s): VITAMINB12, FOLATE, FERRITIN, TIBC, IRON, RETICCTPCT in the last 72 hours. Sepsis Labs: No results for input(s): PROCALCITON, LATICACIDVEN in the last 168 hours.  Recent Results (from the past 240 hour(s))  MRSA PCR Screening     Status: None   Collection Time: 06/15/16 11:38 PM  Result Value Ref Range Status   MRSA by PCR NEGATIVE NEGATIVE Final    Comment:        The GeneXpert MRSA Assay (FDA approved for NASAL specimens only), is one component of a comprehensive MRSA colonization surveillance program. It is not intended to diagnose MRSA infection nor to guide or monitor treatment for MRSA infections.          Radiology Studies: No results found.      Scheduled Meds: . enoxaparin (LOVENOX)  injection  40 mg Subcutaneous Q24H  . insulin starter kit- pen needles  1 kit Other Once  . insulin starter kit- syringes  1 kit Other Once  . rosuvastatin  20 mg Oral QHS   Continuous Infusions: . sodium chloride Stopped (06/16/16 0955)  . dextrose 5 % and 0.45% NaCl 100 mL/hr at 06/16/16 0955  . insulin (NOVOLIN-R) infusion 16.7 Units/hr (06/16/16 0956)     LOS: 1 day    Dron Tanna Furry, MD [facility] Pager [phone]  If 7PM-7AM, please contact night-coverage [email or web address] Password [email or web address] 06/16/2016, 1:04 PM

## 2016-06-16 NOTE — Consult Note (Signed)
Patient well known to me in community.  Given memory disorder and current events, insulin pump no longer feasible.  Recommend transition to bid mixed insulin--70/30 or 75/25, diabetes team to teach wife asap.

## 2016-06-16 NOTE — Progress Notes (Signed)
MD paged- last 3 CBGs are as follows 126, 106, 99. Anion Gap 7, C02 20. Now has orders for SQ insulin but no orders for stopping the drip. MD called back and RN received orders to give SQ insulin and over lap one hour, then discontinue drip.

## 2016-06-17 ENCOUNTER — Inpatient Hospital Stay (HOSPITAL_COMMUNITY): Payer: Medicare Other

## 2016-06-17 DIAGNOSIS — E1310 Other specified diabetes mellitus with ketoacidosis without coma: Secondary | ICD-10-CM

## 2016-06-17 DIAGNOSIS — R0602 Shortness of breath: Secondary | ICD-10-CM

## 2016-06-17 DIAGNOSIS — Z9989 Dependence on other enabling machines and devices: Secondary | ICD-10-CM

## 2016-06-17 DIAGNOSIS — G4733 Obstructive sleep apnea (adult) (pediatric): Secondary | ICD-10-CM

## 2016-06-17 DIAGNOSIS — I10 Essential (primary) hypertension: Secondary | ICD-10-CM

## 2016-06-17 LAB — BASIC METABOLIC PANEL
Anion gap: 10 (ref 5–15)
Anion gap: 12 (ref 5–15)
BUN: 33 mg/dL — AB (ref 6–20)
BUN: 22 mg/dL — AB (ref 6–20)
CALCIUM: 7.7 mg/dL — AB (ref 8.9–10.3)
CHLORIDE: 111 mmol/L (ref 101–111)
CHLORIDE: 106 mmol/L (ref 101–111)
CO2: 17 mmol/L — AB (ref 22–32)
CO2: 20 mmol/L — AB (ref 22–32)
CREATININE: 1.32 mg/dL — AB (ref 0.61–1.24)
CREATININE: 1.09 mg/dL (ref 0.61–1.24)
Calcium: 7.6 mg/dL — ABNORMAL LOW (ref 8.9–10.3)
GFR calc Af Amer: >60 mL/min (ref >60)
GFR calc Af Amer: >60 mL/min (ref >60)
GFR calc non Af Amer: 52 mL/min — ABNORMAL LOW (ref >60)
GFR calc non Af Amer: >60 mL/min (ref >60)
GLUCOSE: 249 mg/dL — AB (ref 65–99)
Glucose, Bld: 215 mg/dL — ABNORMAL HIGH (ref 65–99)
Potassium: 3.4 mmol/L — ABNORMAL LOW (ref 3.5–5.1)
Potassium: 3.3 mmol/L — ABNORMAL LOW (ref 3.5–5.1)
Sodium: 138 mmol/L (ref 135–145)
Sodium: 138 mmol/L (ref 135–145)

## 2016-06-17 LAB — GLUCOSE, CAPILLARY
GLUCOSE-CAPILLARY: 295 mg/dL — AB (ref 65–99)
Glucose-Capillary: 274 mg/dL — ABNORMAL HIGH (ref 65–99)
Glucose-Capillary: 263 mg/dL — ABNORMAL HIGH (ref 65–99)

## 2016-06-17 LAB — BLOOD GAS, ARTERIAL
ACID-BASE DEFICIT: 1.1 mmol/L (ref 0.0–2.0)
BICARBONATE: 21 mmol/L (ref 20.0–28.0)
Drawn by: 295031
FIO2: 21
O2 SAT: 93.5 %
PCO2 ART: 28.8 mmHg — AB (ref 32.0–48.0)
PH ART: 7.476 — AB (ref 7.350–7.450)
PO2 ART: 62.3 mmHg — AB (ref 83.0–108.0)
Patient temperature: 98.6

## 2016-06-17 LAB — MAGNESIUM: Magnesium: 1.9 mg/dL (ref 1.7–2.4)

## 2016-06-17 MED ORDER — HYDRALAZINE HCL 20 MG/ML IJ SOLN
10.0000 mg | INTRAMUSCULAR | Status: DC | PRN
  Administered 2016-06-17 (×2): 10 mg via INTRAVENOUS
  Filled 2016-06-17 (×2): qty 1

## 2016-06-17 MED ORDER — SODIUM CHLORIDE 0.9 % IV SOLN
INTRAVENOUS | Status: DC
Start: 2016-06-17 — End: 2016-06-17
  Administered 2016-06-17: 09:00:00 via INTRAVENOUS

## 2016-06-17 MED ORDER — METOPROLOL TARTRATE 25 MG PO TABS
25.0000 mg | ORAL_TABLET | Freq: Two times a day (BID) | ORAL | Status: DC
  Administered 2016-06-17 (×2): 25 mg via ORAL
  Filled 2016-06-17 (×2): qty 1

## 2016-06-17 MED ORDER — POTASSIUM CHLORIDE CRYS ER 20 MEQ PO TBCR
40.0000 meq | EXTENDED_RELEASE_TABLET | Freq: Once | ORAL | Status: AC
  Administered 2016-06-17: 40 meq via ORAL
  Filled 2016-06-17: qty 2

## 2016-06-17 MED ORDER — LABETALOL HCL 5 MG/ML IV SOLN
10.0000 mg | INTRAVENOUS | Status: DC | PRN
  Administered 2016-06-17 – 2016-06-18 (×4): 10 mg via INTRAVENOUS
  Filled 2016-06-17 (×4): qty 4

## 2016-06-17 MED ORDER — INSULIN ASPART PROT & ASPART (70-30 MIX) 100 UNIT/ML ~~LOC~~ SUSP
25.0000 [IU] | Freq: Two times a day (BID) | SUBCUTANEOUS | Status: DC
  Administered 2016-06-17 – 2016-06-18 (×3): 25 [IU] via SUBCUTANEOUS
  Filled 2016-06-17: qty 10

## 2016-06-17 MED ORDER — ONDANSETRON HCL 4 MG/2ML IJ SOLN
4.0000 mg | Freq: Four times a day (QID) | INTRAMUSCULAR | Status: DC | PRN
  Administered 2016-06-17: 4 mg via INTRAVENOUS
  Filled 2016-06-17: qty 2

## 2016-06-17 MED ORDER — AMLODIPINE BESYLATE 5 MG PO TABS: 5.0000 mg | ORAL_TABLET | Freq: Every day | ORAL | Status: DC

## 2016-06-17 MED ORDER — HYDRALAZINE HCL 10 MG PO TABS
10.0000 mg | ORAL_TABLET | Freq: Three times a day (TID) | ORAL | Status: DC
  Administered 2016-06-17 – 2016-06-18 (×4): 10 mg via ORAL
  Filled 2016-06-17 (×4): qty 1

## 2016-06-17 NOTE — Progress Notes (Signed)
Inpatient Diabetes Program Recommendations  AACE/ADA: New Consensus Statement on Inpatient Glycemic Control (2015)  Target Ranges:  Prepandial:   less than 140 mg/dL      Peak postprandial:   less than 180 mg/dL (1-2 hours)      Critically ill patients:  140 - 180 mg/dL   Results for DAYSON, SPEIER (MRN [id number]) as of 06/17/2016 14:40  Ref. Range 06/17/2016 07:38 06/17/2016 12:16  Glucose-Capillary Latest Ref Range: 65 - 99 mg/dL 295 (H) 274 (H)    Home DM Meds: Insulin Pump  Current Insulin Orders: 70/30 Insulin- 25 units BID      Novolog Moderate Correction Scale/ SSI (0-15 units) TID AC + HS        MD- Note patient's PCP saw him and recommending 70/30 insulin for discharge.  Note 70/30 increased to 25 units BID today.    Spoke with wife today and she plans to administer insulin injections.  Would prefer to have insulin pens at time of discharge due to her familiarity with insulin pens (has given husband (the patient) insulin injections in the past with insulin pens).    Please give patient the following Rxs at time of discharge:  1. Novolog 70/30 Mix Insulin Flexpen- Order # [id number]  2. Insulin Pen needles- Order # [id number]     Verbally reviewed insulin pen use with pt's wife.  Pt's wife able to verbalize all steps of insulin pen use appropriately.  Reviewed how 70/30 insulin works and when to take.  Encouraged pt to eat meal within 15 minutes of injection being given.  Wife stated understanding.  They eat breakfast in the house but have dinner in the dining hall at [facility].  Wife stated she will give all injections to husband and will have husband (patient) follow-up with Dr. [name] after d/c.  Encouraged pt/wife to check patient's CBGs at least TID ac at home.     --Will follow patient during hospitalization--  Wyn Quaker RN, MSN, CDE Diabetes Coordinator Inpatient Glycemic Control Team Team Pager: [phone] (8a-5p)

## 2016-06-17 NOTE — Progress Notes (Signed)
PROGRESS NOTE    Derrick [name]  [name] DOB: 1940/11/03 DOA: 06/15/2016 PCP: Geoffery Lyons, MD    Brief Narrative:  75 y.o.malewith medical history significant of DM on insulin pump, prior DKA, OSA, HTN. Patient presents to the ED with 1 day of vomiting, confusion, lethargy, very high BGLs. Blood sugar was running around 500 at home.  Assessment & Plan:   Principal Problem:   DKA (diabetic ketoacidosis) ([place]) Active Problems:   OSA on CPAP   HTN (hypertension)   AKI (acute kidney injury) ([place])   Hypokalemia  # Diabetic ketoacidosis without coma: -Patient reportedly with difficulties operating insulin pump -Appreciate input by Dr. [name]. Patient has hx of known memory issues which makes insulin pump suboptimal for this patient -Appreciate input by Diabetic Coordinator. Now on 70/30 at 22 units bid. -glucose trends reviewed, in the 300's. Have increased insulin to 25 units bid  #Acute kidney injury likely in the setting of DKA and dehydration:  -Cr has improved with IVF hydration -Cont hydration as tolerated  #Worsening confusion likely acute encephalopathy versus worsening patient's memory disorder:  -Appreciate input by Dr. [name] -Suspecting element of worsening dementia -Cont to hydrate and correct glucose. Recommend close follow up with PCP when discharged  #Hypertension:  -BP suboptimally controlled -Have started metoprolol with BP remaining in the 180-190's -Have started scheduled PO hydralazine -Continue PRN labetalol and IV hydralazine  #Hypokalemia:  -In the setting of DKA. -Still low. Replace -Repeat BMET in AM  #Tachypnea - Noted to have RR into the 30's this afternoon -CXR ordered and reviewed personally. No acute findings -ABG ordered and reviewed -Patient re-assessed. Currently breathing normally around 20 breaths/min -Will cont to monitor  DVT prophylaxis: Lovenox subQ Code Status: Full Family Communication: Pt in  room Disposition Plan: Uncertain at this time  Consultants:     Procedures:     Antimicrobials: Anti-infectives    None       Subjective: Reports feeling better  Objective: Vitals:   06/17/16 1200 06/17/16 1300 06/17/16 1400 06/17/16 1526  BP: (!) 184/70 (!) 188/79 (!) 191/74 (!) 179/79  Pulse: (!) 107 (!) 104 100 93  Resp: (!) 31 (!) 30 (!) 26 20  Temp: 98.1 F (36.7 C)     TempSrc: Oral     SpO2: 94% 94% 94% 94%  Weight:      Height:        Intake/Output Summary (Last 24 hours) at 06/17/16 1652 Last data filed at 06/17/16 1400  Gross per 24 hour  Intake          1851.67 ml  Output             2300 ml  Net          -448.33 ml   Filed Weights   06/15/16 1805  Weight: 81.6 kg (180 lb)    Examination:  General exam: Appears calm and comfortable  Respiratory system: Clear to auscultation. Respiratory effort normal. Cardiovascular system: S1 & S2 heard, RRR.Marland Kitchen Gastrointestinal system: Abdomen is nondistended, soft and nontender. No organomegaly or masses felt. Normal bowel sounds heard. Central nervous system: Alert and oriented. No focal neurological deficits. Extremities: Symmetric 5 x 5 power. Skin: No rashes, lesions Psychiatry: Judgement and insight appear normal. Mood & affect appropriate.   Data Reviewed: I have personally reviewed following labs and imaging studies  CBC:  Recent Labs Lab 06/15/16 1827  WBC 13.3*  HGB 13.5  HCT 40.5  MCV 93.1  PLT 207  Basic Metabolic Panel:  Recent Labs Lab 06/16/16 0301 06/16/16 0805 06/16/16 1240 06/17/16 0351 06/17/16 1424  NA 137 137 139 138 138  K 3.5 3.1* 3.1* 3.4* 3.3*  CL 107 109 112* 111 106  CO2 15* 19* 20* 17* 20*  GLUCOSE 545* 326* 152* 215* 249*  BUN 48* 50* 49* 33* 22*  CREATININE 2.04* 1.82* 1.81* 1.32* 1.09  CALCIUM 8.0* 7.9* 7.9* 7.6* 7.7*  MG  --   --   --  1.9  --    GFR: Estimated Creatinine Clearance: 69.7 mL/min (by C-G formula based on SCr of 1.09 mg/dL). Liver  Function Tests: No results for input(s): AST, ALT, ALKPHOS, BILITOT, PROT, ALBUMIN in the last 168 hours. No results for input(s): LIPASE, AMYLASE in the last 168 hours. No results for input(s): AMMONIA in the last 168 hours. Coagulation Profile: No results for input(s): INR, PROTIME in the last 168 hours. Cardiac Enzymes: No results for input(s): CKTOTAL, CKMB, CKMBINDEX, TROPONINI in the last 168 hours. BNP (last 3 results) No results for input(s): PROBNP in the last 8760 hours. HbA1C: No results for input(s): HGBA1C in the last 72 hours. CBG:  Recent Labs Lab 06/16/16 1625 06/16/16 2141 06/16/16 2205 06/17/16 0738 06/17/16 1216  GLUCAP 87 217* 204* 295* 274*   Lipid Profile: No results for input(s): CHOL, HDL, LDLCALC, TRIG, CHOLHDL, LDLDIRECT in the last 72 hours. Thyroid Function Tests: No results for input(s): TSH, T4TOTAL, FREET4, T3FREE, THYROIDAB in the last 72 hours. Anemia Panel: No results for input(s): VITAMINB12, FOLATE, FERRITIN, TIBC, IRON, RETICCTPCT in the last 72 hours. Sepsis Labs: No results for input(s): PROCALCITON, LATICACIDVEN in the last 168 hours.  Recent Results (from the past 240 hour(s))  MRSA PCR Screening     Status: None   Collection Time: 06/15/16 11:38 PM  Result Value Ref Range Status   MRSA by PCR NEGATIVE NEGATIVE Final    Comment:        The GeneXpert MRSA Assay (FDA approved for NASAL specimens only), is one component of a comprehensive MRSA colonization surveillance program. It is not intended to diagnose MRSA infection nor to guide or monitor treatment for MRSA infections.      Radiology Studies: Dg Chest Port 1 View  Result Date: 06/17/2016 CLINICAL DATA:  Shortness of breath. EXAM: PORTABLE CHEST 1 VIEW COMPARISON:  None. FINDINGS: Normal heart size. No pleural effusion or edema. Lung volumes are decreased. No airspace opacities. IMPRESSION: 1. No acute findings. 2. Decreased lung volumes. Electronically Signed   By:  Kerby Moors M.D.   On: 06/17/2016 14:36    Scheduled Meds: . enoxaparin (LOVENOX) injection  40 mg Subcutaneous Q24H  . hydrALAZINE  10 mg Oral Q8H  . insulin aspart  0-15 Units Subcutaneous TID WC  . insulin aspart  0-5 Units Subcutaneous QHS  . insulin aspart protamine- aspart  25 Units Subcutaneous BID WC  . metoprolol tartrate  25 mg Oral BID  . rosuvastatin  20 mg Oral QHS   Continuous Infusions: . insulin (NOVOLIN-R) infusion Stopped (06/16/16 1500)     LOS: 2 days   Laketia Vicknair, Orpah Melter, MD [facility] Pager [phone]  If 7PM-7AM, please contact night-coverage [email or web address] Password [facility] 06/17/2016, 4:52 PM

## 2016-06-17 NOTE — Progress Notes (Signed)
Patient is from [place]. CSW will assist if patient is in need of rehab.

## 2016-06-18 LAB — BASIC METABOLIC PANEL
ANION GAP: 9 (ref 5–15)
BUN: 18 mg/dL (ref 6–20)
CHLORIDE: 109 mmol/L (ref 101–111)
CO2: 22 mmol/L (ref 22–32)
Calcium: 7.7 mg/dL — ABNORMAL LOW (ref 8.9–10.3)
Creatinine, Ser: 1 mg/dL (ref 0.61–1.24)
GFR calc Af Amer: >60 mL/min (ref >60)
Glucose, Bld: 227 mg/dL — ABNORMAL HIGH (ref 65–99)
POTASSIUM: 3.3 mmol/L — AB (ref 3.5–5.1)
SODIUM: 140 mmol/L (ref 135–145)

## 2016-06-18 LAB — GLUCOSE, CAPILLARY
GLUCOSE-CAPILLARY: 277 mg/dL — AB (ref 65–99)
GLUCOSE-CAPILLARY: 215 mg/dL — AB (ref 65–99)
GLUCOSE-CAPILLARY: 275 mg/dL — AB (ref 65–99)
Glucose-Capillary: 215 mg/dL — ABNORMAL HIGH (ref 65–99)

## 2016-06-18 LAB — CBC
HEMATOCRIT: 37 % — AB (ref 39.0–52.0)
Hemoglobin: 12.9 g/dL — ABNORMAL LOW (ref 13.0–17.0)
MCH: 30.9 pg (ref 26.0–34.0)
MCHC: 34.9 g/dL (ref 30.0–36.0)
MCV: 88.7 fL (ref 78.0–100.0)
Platelets: 160 10*3/uL (ref 150–400)
RBC: 4.17 MIL/uL — ABNORMAL LOW (ref 4.22–5.81)
RDW: 14.3 % (ref 11.5–15.5)
WBC: 7.7 10*3/uL (ref 4.0–10.5)

## 2016-06-18 MED ORDER — INSULIN STARTER KIT- PEN NEEDLES (ENGLISH)
1.0000 | Freq: Once | Status: DC
  Filled 2016-06-18: qty 1

## 2016-06-18 MED ORDER — AMLODIPINE BESYLATE 10 MG PO TABS
10.0000 mg | ORAL_TABLET | Freq: Every evening | ORAL | Status: DC
Start: 2016-06-18 — End: 2016-06-18
  Administered 2016-06-18: 10 mg via ORAL
  Filled 2016-06-18: qty 1

## 2016-06-18 MED ORDER — LABETALOL HCL 200 MG PO TABS
200.0000 mg | ORAL_TABLET | Freq: Two times a day (BID) | ORAL | Status: DC
  Administered 2016-06-18: 200 mg via ORAL
  Filled 2016-06-18: qty 1

## 2016-06-18 MED ORDER — INSULIN ASPART PROT & ASPART (70-30 MIX) 100 UNIT/ML PEN: 30.0000 [IU] | PEN_INJECTOR | Freq: Two times a day (BID) | SUBCUTANEOUS | 0 refills | Status: DC

## 2016-06-18 MED ORDER — POTASSIUM CHLORIDE CRYS ER 20 MEQ PO TBCR
40.0000 meq | EXTENDED_RELEASE_TABLET | Freq: Two times a day (BID) | ORAL | Status: DC
  Administered 2016-06-18: 40 meq via ORAL
  Filled 2016-06-18: qty 2

## 2016-06-18 MED ORDER — INSULIN ASPART PROT & ASPART (70-30 MIX) 100 UNIT/ML ~~LOC~~ SUSP: 28.0000 [IU] | Freq: Two times a day (BID) | SUBCUTANEOUS | Status: DC

## 2016-06-18 MED ORDER — INSULIN ASPART PROT & ASPART (70-30 MIX) 100 UNIT/ML ~~LOC~~ SUSP
30.0000 [IU] | Freq: Two times a day (BID) | SUBCUTANEOUS | Status: DC
  Administered 2016-06-18: 30 [IU] via SUBCUTANEOUS

## 2016-06-18 NOTE — Evaluation (Signed)
Occupational Therapy Evaluation Patient Details Name: Derrick [name] MRN: [id number] DOB: 01-27-1941 Today's Date: 06/18/2016    History of Present Illness This 75 y.o. male admitted with 1 day h/o vomiting, confusion, lethargy.  Dx:  DKA due to difficulties operating insulin pump (pt with h/o known memory issues), AKI likely due to DKA, worsening confusion likely due to encephalopathy vs. worsening memory disorder, HTN.  PMH:   DM, DKA, OSA, macular degeration   Clinical Impression   Patient evaluated by Occupational Therapy with no further acute OT needs identified. All education has been completed and the patient has no further questions. Pt appears to be approaching his baseline - wife present and feels he is close to his normal.  He is able to perform ADLs with supervision.   See below for any follow-up Occupational Therapy or equipment needs. OT is signing off. Thank you for this referral.      Follow Up Recommendations  No OT follow up;Supervision - Intermittent    Equipment Recommendations  None recommended by OT    Recommendations for Other Services       Precautions / Restrictions Precautions Precautions: None      Mobility Bed Mobility Overal bed mobility: Independent                Transfers Overall transfer level: Modified independent                    Balance Overall balance assessment: No apparent balance deficits (not formally assessed)                                          ADL Overall ADL's : Needs assistance/impaired                                       General ADL Comments: Pt requires supervision due to lines and unfamiliar environment.  He is able to simulate showering in standing with single UE support and no LOB     Vision     Perception     Praxis      Pertinent Vitals/Pain Pain Assessment: No/denies pain     Hand Dominance Right   Extremity/Trunk Assessment Upper  Extremity Assessment Upper Extremity Assessment: Overall WFL for tasks assessed   Lower Extremity Assessment Lower Extremity Assessment: Defer to PT evaluation   Cervical / Trunk Assessment Cervical / Trunk Assessment: Normal   Communication Communication Communication: No difficulties   Cognition Arousal/Alertness: Awake/alert Behavior During Therapy: WFL for tasks assessed/performed Overall Cognitive Status: History of cognitive impairments - at baseline                 General Comments: wife reports she feels pt is back to baseline.  Memory deficits noted    General Comments       Exercises       Shoulder Instructions      Home Living Family/patient expects to be discharged to:: Private residence Living Arrangements: Spouse/significant other Available Help at Discharge: Family;Available 24 hours/day Type of Home: Apartment Home Access: Elevator;Level entry     Home Layout: One level     Bathroom Shower/Tub: Occupational psychologist: Handicapped height     Home Equipment: Grab bars - toilet;Grab bars - tub/shower   Additional Comments:  Pt resides at [facility] in the independent living section       Prior Functioning/Environment Level of Independence: Independent        Comments: Per pt, and wife, pt was fully indepenedent PTA and previously drove         OT Problem List: Decreased strength;Decreased cognition   OT Treatment/Interventions:      OT Goals(Current goals can be found in the care plan section) Acute Rehab OT Goals Patient Stated Goal: to go home  OT Goal Formulation: All assessment and education complete, DC therapy  OT Frequency:     Barriers to D/C:            Co-evaluation              End of Session Nurse Communication: Mobility status  Activity Tolerance: Patient tolerated treatment well Patient left: in bed;with call bell/phone within reach;with bed alarm set   Time: [id number] OT Time  Calculation (min): 22 min Charges:  OT General Charges $OT Visit: 1 Procedure OT Evaluation $OT Eval Low Complexity: 1 Procedure G-Codes:    Rickiya Picariello M 06/24/2016, 3:32 PM

## 2016-06-18 NOTE — Progress Notes (Signed)
Pt significant other stated she wasn't comfortable with him going back to [facility]. She IS comfortable and well informed with the insulin pen and needs no further teaching. She expressed concern over [place] being informed of medication change and being aware of discharge tonight. This RN called [facility] and is informing them of change and making them aware of discharge tonight. Family member is getting clothes and a jacket for the patient to go home in.

## 2016-06-18 NOTE — Progress Notes (Signed)
PT Cancellation Note  Patient Details Name: AJAHNI BENZIGER MRN: [id number] DOB: [birth year]   Cancelled Treatment:     PT order received but eval deferred 2* pt BP elevated to 188/80.  RN aware.  Will follow.   Jeanita Carneiro 06/18/2016, 1:06 PM

## 2016-06-18 NOTE — Evaluation (Signed)
Physical Therapy Evaluation Patient Details Name: Derrick [name] MRN: [id number] DOB: October 14, 1940 Today's Date: 06/18/2016   History of Present Illness  This 75 y.o. male admitted with 1 day h/o vomiting, confusion, lethargy.  Dx:  DKA due to difficulties operating insulin pump (pt with h/o known memory issues), AKI likely due to DKA, worsening confusion likely due to encephalopathy vs. worsening memory disorder, HTN.  PMH:   DM, DKA, OSA, macular degeration  Clinical Impression  Pt admitted as above and presenting with functional mobility limitations 2* mild ambulatory instability.  Pt should progress to dc home with family assist.    Follow Up Recommendations No PT follow up    Equipment Recommendations  None recommended by PT    Recommendations for Other Services       Precautions / Restrictions Precautions Precautions: None Restrictions Weight Bearing Restrictions: No      Mobility  Bed Mobility Overal bed mobility: Independent                Transfers Overall transfer level: Needs assistance Equipment used: None Transfers: Sit to/from Stand Sit to Stand: Min guard         General transfer comment: steady assist with initial stand up  Ambulation/Gait Ambulation/Gait assistance: Min assist;Min guard;Supervision Ambulation Distance (Feet): 500 Feet Assistive device: Rolling walker (2 wheeled);1 person hand held assist;None Gait Pattern/deviations: Step-through pattern;Decreased step length - right;Decreased step length - left;Shuffle     General Gait Details: Pt initially with mild general instability and utilizing RW but with increased distance ambulated progressed to min HHA and to sup sans AD.  Pt's wife observing last 58' and reports pt at ~baseline for ambulation  Stairs            Wheelchair Mobility    Modified Rankin (Stroke Patients Only)       Balance Overall balance assessment: Needs assistance Sitting-balance support: Feet  supported;No upper extremity supported Sitting balance-Leahy Scale: Good     Standing balance support: No upper extremity supported Standing balance-Leahy Scale: Fair                               Pertinent Vitals/Pain Pain Assessment: No/denies pain    Home Living Family/patient expects to be discharged to:: Private residence Living Arrangements: Spouse/significant other Available Help at Discharge: Family;Available 24 hours/day Type of Home: Apartment Home Access: Elevator;Level entry     Home Layout: One level Home Equipment: Grab bars - toilet;Grab bars - tub/shower;Walker - 2 wheels;Kasandra Knudsen - single point Additional Comments: Pt resides at [facility] in the independent living section     Prior Function Level of Independence: Independent         Comments: Per pt, and wife, pt was fully indepenedent PTA and previously drove      Hand Dominance   Dominant Hand: Right    Extremity/Trunk Assessment   Upper Extremity Assessment Upper Extremity Assessment: Overall WFL for tasks assessed    Lower Extremity Assessment Lower Extremity Assessment: Overall WFL for tasks assessed    Cervical / Trunk Assessment Cervical / Trunk Assessment: Normal  Communication   Communication: No difficulties  Cognition Arousal/Alertness: Awake/alert Behavior During Therapy: WFL for tasks assessed/performed Overall Cognitive Status: History of cognitive impairments - at baseline                 General Comments: wife reports she feels pt is back to baseline.  Memory deficits noted  General Comments General comments (skin integrity, edema, etc.): wife present.  VSS     Exercises     Assessment/Plan    PT Assessment Patient needs continued PT services  PT Problem List Decreased balance          PT Treatment Interventions DME instruction;Gait training;Functional mobility training;Therapeutic activities;Therapeutic exercise;Balance  training;Patient/family education    PT Goals (Current goals can be found in the Care Plan section)  Acute Rehab PT Goals Patient Stated Goal: to go home  PT Goal Formulation: With patient Time For Goal Achievement: 06/27/16 Potential to Achieve Goals: Good    Frequency Min 3X/week   Barriers to discharge        Co-evaluation               End of Session Equipment Utilized During Treatment: Gait belt Activity Tolerance: Patient tolerated treatment well Patient left: in chair;with call bell/phone within reach;with chair alarm set;with family/visitor present Nurse Communication: Mobility status         Time: 1340-1403 PT Time Calculation (min) (ACUTE ONLY): 23 min   Charges:   PT Evaluation $PT Eval Low Complexity: 1 Procedure PT Treatments $Gait Training: 8-22 mins   PT G Codes:        Ilayda Toda 06/19/16, 3:54 PM

## 2016-06-18 NOTE — Discharge Summary (Signed)
Physician Discharge Summary  Derrick [name] [name] DOB: 1940-12-19 DOA: 06/15/2016  PCP: Derrick Lyons, MD  Admit date: 06/15/2016 Discharge date: 06/18/2016  Admitted From: Home Disposition:  Home  Recommendations for Outpatient Follow-up:  1. Follow up with PCP in 1-2 weeks 2. Please titrate insulin dose as needed to ensure euglycemia 3. Recommend repeat BMET in 1-2 weeks 4. Recommend formal dementia workup as outpatient  Discharge Condition:Improved CODE STATUS:Full Diet recommendation: Diabetic   Brief/Interim Summary: 75 y.o.malewith medical history significant of DM on insulin pump, prior DKA, OSA, HTN. Patient presents to the ED with 1 day of vomiting, confusion, lethargy, very high BGLs. Blood sugar was running around 500 at home  # Diabetic ketoacidosis without coma: -Patient reportedly with difficulties operating insulin pump -Appreciate input by Dr. Reynaldo [name]. Patient has hx of known memory issues which makes insulin pump suboptimal for this patient -Appreciate input by Diabetic Coordinator. Now on 70/30 at 25 units BID -glucose trends reviewed with readings still in the 200's -Will increase insulin dose to 30 units BID per diabetic coordinator recommendations  #Acute kidney injury likely in the setting of DKA and dehydration:  -Cr has improved with IVF hydration -Cont hydration as tolerated  #Worsening confusion likely acute encephalopathy versus worsening patient's memory disorder:  -Appreciate input by Dr. Reynaldo [name] -Suspecting element of worsening dementia -Cont to hydrate and correct glucose. Recommend close follow up with PCP when discharged  #Hypertension:  -BP had been suboptimally controlled -Home BP meds were resumed with much better bp control -Will ask PCP to further titrate bp meds accordingly  #Hypokalemia:  -In the setting of DKA. -replaced -Repeat BMET in 1-2 weeks  #Tachypnea -CXR ordered and reviewed personally. No  acute findings -ABG ordered and reviewed -Patient re-assessed. Currently breathing normally around 20 breaths/min -resolved. Ambulated in hallway without difficulty  Discharge Diagnoses:  Principal Problem:   Diabetic ketoacidosis without coma associated with type 2 diabetes mellitus ([place]) Active Problems:   OSA on CPAP   HTN (hypertension)   AKI (acute kidney injury) ([place])   Hypokalemia   SOB (shortness of breath)    Discharge Instructions   Allergies as of 06/18/2016   No Known Allergies     Medication List    STOP taking these medications   insulin pump Soln     TAKE these medications   amLODipine 10 MG tablet Commonly known as:  NORVASC Take 10 mg by mouth every evening.   aspirin EC 81 MG tablet Take 81 mg by mouth daily.   donepezil 5 MG tablet Commonly known as:  ARICEPT Take 5 mg by mouth daily after supper. Patient takes in the pm   insulin aspart 100 UNIT/ML injection Commonly known as:  novoLOG Inject 0-15 Units into the skin 3 (three) times daily with meals. Check blood sugars every 4 hours while awake and use the Humalog pen: Greater than 350 give 12 units, 251-350 give 8 units, 151-250 give 4 units. What changed:  how much to take  when to take this  additional instructions   insulin aspart protamine - aspart (70-30) 100 UNIT/ML FlexPen Commonly known as:  NOVOLOG 70/30 MIX Inject 0.3 mLs (30 Units total) into the skin 2 (two) times daily with [name] meal.   labetalol 200 MG tablet Commonly known as:  NORMODYNE Take 200 mg by mouth 2 (two) times daily.   lisinopril-hydrochlorothiazide 20-12.5 MG tablet Commonly known as:  PRINZIDE,ZESTORETIC Take 1 tablet by mouth 2 (two) times daily.   Polyethyl Glycol-Propyl Glycol  0.4-0.3 % Soln Apply 1 drop to eye daily as needed (dry eyes).   PRESERVISION AREDS 2 PO Take 1 capsule by mouth 2 (two) times daily.   rosuvastatin 20 MG tablet Commonly known as:  CRESTOR Take 20 mg by mouth daily.       Follow-up Information    Derrick A, MD. Schedule an appointment as soon as possible for [name] visit in 2 week(s).   Specialty:  Internal Medicine Contact information: [address] [place] [address] [phone]          No Known Allergies    Procedures/Studies: Dg Chest Port 1 View  Result Date: 06/17/2016 CLINICAL DATA:  Shortness of breath. EXAM: PORTABLE CHEST 1 VIEW COMPARISON:  None. FINDINGS: Normal heart size. No pleural effusion or edema. Lung volumes are decreased. No airspace opacities. IMPRESSION: 1. No acute findings. 2. Decreased lung volumes. Electronically Signed   By: Kerby Moors M.D.   On: 06/17/2016 14:36    Subjective: No complatins  Discharge Exam: Vitals:   06/18/16 1100 06/18/16 1200  BP: (!) 179/83 (!) 149/59  Pulse: 86 74  Resp: (!) 25 (!) 21  Temp:  98.1 F (36.7 C)   Vitals:   06/18/16 0900 06/18/16 1000 06/18/16 1100 06/18/16 1200  BP: (!) 180/88 (!) 152/60 (!) 179/83 (!) 149/59  Pulse: 94 87 86 74  Resp: (!) 29 (!) 26 (!) 25 (!) 21  Temp:    98.1 F (36.7 C)  TempSrc:    Oral  SpO2: 97% 97% 99% 96%  Weight:      Height:        General: Pt is alert, awake, not in acute distress Cardiovascular: RRR, S1/S2 +, no rubs, no gallops Respiratory: CTA bilaterally, no wheezing, no rhonchi Abdominal: Soft, NT, ND, bowel sounds + Extremities: no edema, no cyanosis   The results of significant diagnostics from this hospitalization (including imaging, microbiology, ancillary and laboratory) are listed below for reference.     Microbiology: Recent Results (from the past 240 hour(s))  MRSA PCR Screening     Status: None   Collection Time: 06/15/16 11:38 PM  Result Value Ref Range Status   MRSA by PCR NEGATIVE NEGATIVE Final    Comment:        The GeneXpert MRSA Assay (FDA approved for NASAL specimens only), is one component of [name] comprehensive MRSA colonization surveillance program. It is not intended to diagnose  MRSA infection nor to guide or monitor treatment for MRSA infections.      Labs: BNP (last 3 results) No results for input(s): BNP in the last 8760 hours. Basic Metabolic Panel:  Recent Labs Lab 06/16/16 0805 06/16/16 1240 06/17/16 0351 06/17/16 1424 06/18/16 0346  NA 137 139 138 138 140  K 3.1* 3.1* 3.4* 3.3* 3.3*  CL 109 112* 111 106 109  CO2 19* 20* 17* 20* 22  GLUCOSE 326* 152* 215* 249* 227*  BUN 50* 49* 33* 22* 18  CREATININE 1.82* 1.81* 1.32* 1.09 1.00  CALCIUM 7.9* 7.9* 7.6* 7.7* 7.7*  MG  --   --  1.9  --   --    Liver Function Tests: No results for input(s): AST, ALT, ALKPHOS, BILITOT, PROT, ALBUMIN in the last 168 hours. No results for input(s): LIPASE, AMYLASE in the last 168 hours. No results for input(s): AMMONIA in the last 168 hours. CBC:  Recent Labs Lab 06/15/16 1827 06/18/16 0346  WBC 13.3* 7.7  HGB 13.5 12.9*  HCT 40.5 37.0*  MCV  93.1 88.7  PLT 207 160   Cardiac Enzymes: No results for input(s): CKTOTAL, CKMB, CKMBINDEX, TROPONINI in the last 168 hours. BNP: Invalid input(s): POCBNP CBG:  Recent Labs Lab 06/17/16 1216 06/17/16 1723 06/17/16 2102 06/18/16 0739 06/18/16 1217  GLUCAP 274* 263* 215* 277* 215*   D-Dimer No results for input(s): DDIMER in the last 72 hours. Hgb A1c No results for input(s): HGBA1C in the last 72 hours. Lipid Profile No results for input(s): CHOL, HDL, LDLCALC, TRIG, CHOLHDL, LDLDIRECT in the last 72 hours. Thyroid function studies No results for input(s): TSH, T4TOTAL, T3FREE, THYROIDAB in the last 72 hours.  Invalid input(s): FREET3 Anemia work up No results for input(s): VITAMINB12, FOLATE, FERRITIN, TIBC, IRON, RETICCTPCT in the last 72 hours. Urinalysis    Component Value Date/Time   COLORURINE YELLOW 06/15/2016 2214   APPEARANCEUR CLEAR 06/15/2016 2214   LABSPEC 1.021 06/15/2016 2214   PHURINE 5.0 06/15/2016 2214   GLUCOSEU >=500 ([name]) 06/15/2016 2214   HGBUR NEGATIVE 06/15/2016 2214    BILIRUBINUR NEGATIVE 06/15/2016 2214   KETONESUR 80 ([name]) 06/15/2016 2214   PROTEINUR NEGATIVE 06/15/2016 2214   UROBILINOGEN 0.2 08/25/2011 1126   NITRITE NEGATIVE 06/15/2016 2214   LEUKOCYTESUR NEGATIVE 06/15/2016 2214   Sepsis Labs Invalid input(s): PROCALCITONIN,  WBC,  LACTICIDVEN Microbiology Recent Results (from the past 240 hour(s))  MRSA PCR Screening     Status: None   Collection Time: 06/15/16 11:38 PM  Result Value Ref Range Status   MRSA by PCR NEGATIVE NEGATIVE Final    Comment:        The GeneXpert MRSA Assay (FDA approved for NASAL specimens only), is one component of [name] comprehensive MRSA colonization surveillance program. It is not intended to diagnose MRSA infection nor to guide or monitor treatment for MRSA infections.      SIGNED:   Donne Hazel, MD  [facility] 06/18/2016, 3:56 PM  If 7PM-7AM, please contact night-coverage [email or web address] Password [email or web address]

## 2016-06-18 NOTE — Progress Notes (Signed)
Inpatient Diabetes Program Recommendations  AACE/ADA: New Consensus Statement on Inpatient Glycemic Control (2015)  Target Ranges:  Prepandial:   less than 140 mg/dL      Peak postprandial:   less than 180 mg/dL (1-2 hours)      Critically ill patients:  140 - 180 mg/dL   Results for Derrick [name], Derrick [name] (MRN [id number]) as of 06/18/2016 09:38  Ref. Range 06/17/2016 07:38 06/17/2016 12:16 06/17/2016 17:23 06/17/2016 21:02  Glucose-Capillary Latest Ref Range: 65 - 99 mg/dL 295 (H) 274 (H) 263 (H) 215 (H)   Results for Derrick [name], Derrick [name] (MRN [id number]) as of 06/18/2016 09:38  Ref. Range 06/18/2016 07:39  Glucose-Capillary Latest Ref Range: 65 - 99 mg/dL 277 (H)    Home DM Meds: Insulin Pump  Current Insulin Orders: 70/30 Insulin- 25 units BID                                       Novolog Moderate Correction Scale/ SSI (0-15 units) TID AC + HS        MD- Note patient's PCP saw him and recommending 70/30 insulin for discharge.  Note 70/30 increased to 25 units BID yesterday.    Glucose levels remain elevated.  Please consider further increasing 70/30 Insulin to 30 units BID     Spoke with wife yesterday and she plans to administer insulin injections.  Would prefer to have insulin pens at time of discharge due to her familiarity with insulin pens (has given husband (the patient) insulin injections in the past with insulin pens).    Please give patient the following Rxs at time of discharge:  1. Novolog 70/30 Mix Insulin Flexpen- Order # [id number]  2. Insulin Pen needles- Order # [id number]     --Will follow patient during hospitalization--  Wyn Quaker RN, MSN, CDE Diabetes Coordinator Inpatient Glycemic Control Team Team Pager: [phone] (8a-5p)

## 2016-07-01 DIAGNOSIS — Z9641 Presence of insulin pump (external) (internal): Secondary | ICD-10-CM | POA: Diagnosis not present

## 2016-07-01 DIAGNOSIS — E785 Hyperlipidemia, unspecified: Secondary | ICD-10-CM | POA: Diagnosis not present

## 2016-07-01 DIAGNOSIS — N182 Chronic kidney disease, stage 2 (mild): Secondary | ICD-10-CM | POA: Diagnosis not present

## 2016-07-01 DIAGNOSIS — I10 Essential (primary) hypertension: Secondary | ICD-10-CM | POA: Diagnosis not present

## 2016-07-01 DIAGNOSIS — R2681 Unsteadiness on feet: Secondary | ICD-10-CM | POA: Diagnosis not present

## 2016-07-01 DIAGNOSIS — M6281 Muscle weakness (generalized): Secondary | ICD-10-CM | POA: Diagnosis not present

## 2016-07-01 DIAGNOSIS — E1165 Type 2 diabetes mellitus with hyperglycemia: Secondary | ICD-10-CM | POA: Diagnosis not present

## 2016-07-02 DIAGNOSIS — E1165 Type 2 diabetes mellitus with hyperglycemia: Secondary | ICD-10-CM | POA: Diagnosis not present

## 2016-07-02 DIAGNOSIS — R2681 Unsteadiness on feet: Secondary | ICD-10-CM | POA: Diagnosis not present

## 2016-07-02 DIAGNOSIS — N182 Chronic kidney disease, stage 2 (mild): Secondary | ICD-10-CM | POA: Diagnosis not present

## 2016-07-02 DIAGNOSIS — M6281 Muscle weakness (generalized): Secondary | ICD-10-CM | POA: Diagnosis not present

## 2016-07-02 DIAGNOSIS — Z9641 Presence of insulin pump (external) (internal): Secondary | ICD-10-CM | POA: Diagnosis not present

## 2016-07-02 DIAGNOSIS — I10 Essential (primary) hypertension: Secondary | ICD-10-CM | POA: Diagnosis not present

## 2016-07-03 DIAGNOSIS — R2681 Unsteadiness on feet: Secondary | ICD-10-CM | POA: Diagnosis not present

## 2016-07-03 DIAGNOSIS — Z9641 Presence of insulin pump (external) (internal): Secondary | ICD-10-CM | POA: Diagnosis not present

## 2016-07-03 DIAGNOSIS — E1165 Type 2 diabetes mellitus with hyperglycemia: Secondary | ICD-10-CM | POA: Diagnosis not present

## 2016-07-03 DIAGNOSIS — M6281 Muscle weakness (generalized): Secondary | ICD-10-CM | POA: Diagnosis not present

## 2016-07-03 DIAGNOSIS — N182 Chronic kidney disease, stage 2 (mild): Secondary | ICD-10-CM | POA: Diagnosis not present

## 2016-07-03 DIAGNOSIS — I10 Essential (primary) hypertension: Secondary | ICD-10-CM | POA: Diagnosis not present

## 2016-07-06 DIAGNOSIS — N182 Chronic kidney disease, stage 2 (mild): Secondary | ICD-10-CM | POA: Diagnosis not present

## 2016-07-06 DIAGNOSIS — R5383 Other fatigue: Secondary | ICD-10-CM | POA: Diagnosis not present

## 2016-07-06 DIAGNOSIS — Z1389 Encounter for screening for other disorder: Secondary | ICD-10-CM | POA: Diagnosis not present

## 2016-07-06 DIAGNOSIS — Z6829 Body mass index (BMI) 29.0-29.9, adult: Secondary | ICD-10-CM | POA: Diagnosis not present

## 2016-07-06 DIAGNOSIS — E1129 Type 2 diabetes mellitus with other diabetic kidney complication: Secondary | ICD-10-CM | POA: Diagnosis not present

## 2016-07-06 DIAGNOSIS — R5381 Other malaise: Secondary | ICD-10-CM | POA: Diagnosis not present

## 2016-07-06 DIAGNOSIS — R413 Other amnesia: Secondary | ICD-10-CM | POA: Diagnosis not present

## 2016-07-07 DIAGNOSIS — Z9641 Presence of insulin pump (external) (internal): Secondary | ICD-10-CM | POA: Diagnosis not present

## 2016-07-07 DIAGNOSIS — M6281 Muscle weakness (generalized): Secondary | ICD-10-CM | POA: Diagnosis not present

## 2016-07-07 DIAGNOSIS — N182 Chronic kidney disease, stage 2 (mild): Secondary | ICD-10-CM | POA: Diagnosis not present

## 2016-07-07 DIAGNOSIS — R2681 Unsteadiness on feet: Secondary | ICD-10-CM | POA: Diagnosis not present

## 2016-07-07 DIAGNOSIS — E1165 Type 2 diabetes mellitus with hyperglycemia: Secondary | ICD-10-CM | POA: Diagnosis not present

## 2016-07-07 DIAGNOSIS — I10 Essential (primary) hypertension: Secondary | ICD-10-CM | POA: Diagnosis not present

## 2016-07-09 DIAGNOSIS — M6281 Muscle weakness (generalized): Secondary | ICD-10-CM | POA: Diagnosis not present

## 2016-07-09 DIAGNOSIS — Z9641 Presence of insulin pump (external) (internal): Secondary | ICD-10-CM | POA: Diagnosis not present

## 2016-07-09 DIAGNOSIS — R2681 Unsteadiness on feet: Secondary | ICD-10-CM | POA: Diagnosis not present

## 2016-07-09 DIAGNOSIS — E1165 Type 2 diabetes mellitus with hyperglycemia: Secondary | ICD-10-CM | POA: Diagnosis not present

## 2016-07-09 DIAGNOSIS — I10 Essential (primary) hypertension: Secondary | ICD-10-CM | POA: Diagnosis not present

## 2016-07-09 DIAGNOSIS — N182 Chronic kidney disease, stage 2 (mild): Secondary | ICD-10-CM | POA: Diagnosis not present

## 2016-07-10 DIAGNOSIS — E1165 Type 2 diabetes mellitus with hyperglycemia: Secondary | ICD-10-CM | POA: Diagnosis not present

## 2016-07-10 DIAGNOSIS — I10 Essential (primary) hypertension: Secondary | ICD-10-CM | POA: Diagnosis not present

## 2016-07-10 DIAGNOSIS — N182 Chronic kidney disease, stage 2 (mild): Secondary | ICD-10-CM | POA: Diagnosis not present

## 2016-07-10 DIAGNOSIS — R2681 Unsteadiness on feet: Secondary | ICD-10-CM | POA: Diagnosis not present

## 2016-07-10 DIAGNOSIS — M6281 Muscle weakness (generalized): Secondary | ICD-10-CM | POA: Diagnosis not present

## 2016-07-10 DIAGNOSIS — Z9641 Presence of insulin pump (external) (internal): Secondary | ICD-10-CM | POA: Diagnosis not present

## 2016-07-14 DIAGNOSIS — R2681 Unsteadiness on feet: Secondary | ICD-10-CM | POA: Diagnosis not present

## 2016-07-14 DIAGNOSIS — E1165 Type 2 diabetes mellitus with hyperglycemia: Secondary | ICD-10-CM | POA: Diagnosis not present

## 2016-07-14 DIAGNOSIS — I10 Essential (primary) hypertension: Secondary | ICD-10-CM | POA: Diagnosis not present

## 2016-07-14 DIAGNOSIS — Z9641 Presence of insulin pump (external) (internal): Secondary | ICD-10-CM | POA: Diagnosis not present

## 2016-07-14 DIAGNOSIS — M6281 Muscle weakness (generalized): Secondary | ICD-10-CM | POA: Diagnosis not present

## 2016-07-14 DIAGNOSIS — N182 Chronic kidney disease, stage 2 (mild): Secondary | ICD-10-CM | POA: Diagnosis not present

## 2016-07-15 DIAGNOSIS — H25043 Posterior subcapsular polar age-related cataract, bilateral: Secondary | ICD-10-CM | POA: Diagnosis not present

## 2016-07-15 DIAGNOSIS — E113393 Type 2 diabetes mellitus with moderate nonproliferative diabetic retinopathy without macular edema, bilateral: Secondary | ICD-10-CM | POA: Diagnosis not present

## 2016-07-15 DIAGNOSIS — H353212 Exudative age-related macular degeneration, right eye, with inactive choroidal neovascularization: Secondary | ICD-10-CM | POA: Diagnosis not present

## 2016-07-15 DIAGNOSIS — Z0100 Encounter for examination of eyes and vision without abnormal findings: Secondary | ICD-10-CM | POA: Diagnosis not present

## 2016-07-16 DIAGNOSIS — R2681 Unsteadiness on feet: Secondary | ICD-10-CM | POA: Diagnosis not present

## 2016-07-16 DIAGNOSIS — I10 Essential (primary) hypertension: Secondary | ICD-10-CM | POA: Diagnosis not present

## 2016-07-16 DIAGNOSIS — N182 Chronic kidney disease, stage 2 (mild): Secondary | ICD-10-CM | POA: Diagnosis not present

## 2016-07-16 DIAGNOSIS — M6281 Muscle weakness (generalized): Secondary | ICD-10-CM | POA: Diagnosis not present

## 2016-07-16 DIAGNOSIS — Z9641 Presence of insulin pump (external) (internal): Secondary | ICD-10-CM | POA: Diagnosis not present

## 2016-07-16 DIAGNOSIS — E1165 Type 2 diabetes mellitus with hyperglycemia: Secondary | ICD-10-CM | POA: Diagnosis not present

## 2016-07-17 DIAGNOSIS — R2681 Unsteadiness on feet: Secondary | ICD-10-CM | POA: Diagnosis not present

## 2016-07-17 DIAGNOSIS — N182 Chronic kidney disease, stage 2 (mild): Secondary | ICD-10-CM | POA: Diagnosis not present

## 2016-07-17 DIAGNOSIS — I129 Hypertensive chronic kidney disease with stage 1 through stage 4 chronic kidney disease, or unspecified chronic kidney disease: Secondary | ICD-10-CM | POA: Diagnosis not present

## 2016-07-17 DIAGNOSIS — M6281 Muscle weakness (generalized): Secondary | ICD-10-CM | POA: Diagnosis not present

## 2016-07-17 DIAGNOSIS — I10 Essential (primary) hypertension: Secondary | ICD-10-CM | POA: Diagnosis not present

## 2016-07-17 DIAGNOSIS — R2689 Other abnormalities of gait and mobility: Secondary | ICD-10-CM | POA: Diagnosis not present

## 2016-07-17 DIAGNOSIS — R5381 Other malaise: Secondary | ICD-10-CM | POA: Diagnosis not present

## 2016-07-17 DIAGNOSIS — E1165 Type 2 diabetes mellitus with hyperglycemia: Secondary | ICD-10-CM | POA: Diagnosis not present

## 2016-07-17 DIAGNOSIS — Z9641 Presence of insulin pump (external) (internal): Secondary | ICD-10-CM | POA: Diagnosis not present

## 2016-07-17 DIAGNOSIS — E1169 Type 2 diabetes mellitus with other specified complication: Secondary | ICD-10-CM | POA: Diagnosis not present

## 2016-07-17 DIAGNOSIS — E669 Obesity, unspecified: Secondary | ICD-10-CM | POA: Diagnosis not present

## 2016-07-17 DIAGNOSIS — E1129 Type 2 diabetes mellitus with other diabetic kidney complication: Secondary | ICD-10-CM | POA: Diagnosis not present

## 2016-07-17 DIAGNOSIS — E784 Other hyperlipidemia: Secondary | ICD-10-CM | POA: Diagnosis not present

## 2016-07-17 DIAGNOSIS — Z794 Long term (current) use of insulin: Secondary | ICD-10-CM | POA: Diagnosis not present

## 2016-07-19 ENCOUNTER — Emergency Department (HOSPITAL_COMMUNITY)
Admission: EM | Admit: 2016-07-19 | Discharge: 2016-07-19 | Disposition: A | Discharge status: Home / Self Care | Payer: Medicare Other | Attending: Emergency Medicine | Admitting: Emergency Medicine

## 2016-07-19 ENCOUNTER — Encounter (HOSPITAL_COMMUNITY): Payer: Self-pay | Admitting: Emergency Medicine

## 2016-07-19 DIAGNOSIS — E876 Hypokalemia: Secondary | ICD-10-CM

## 2016-07-19 DIAGNOSIS — I10 Essential (primary) hypertension: Secondary | ICD-10-CM | POA: Insufficient documentation

## 2016-07-19 DIAGNOSIS — E161 Other hypoglycemia: Secondary | ICD-10-CM | POA: Diagnosis not present

## 2016-07-19 DIAGNOSIS — Z79899 Other long term (current) drug therapy: Secondary | ICD-10-CM | POA: Diagnosis not present

## 2016-07-19 DIAGNOSIS — Z87891 Personal history of nicotine dependence: Secondary | ICD-10-CM | POA: Diagnosis not present

## 2016-07-19 DIAGNOSIS — Z794 Long term (current) use of insulin: Secondary | ICD-10-CM | POA: Insufficient documentation

## 2016-07-19 DIAGNOSIS — Z7982 Long term (current) use of aspirin: Secondary | ICD-10-CM | POA: Insufficient documentation

## 2016-07-19 DIAGNOSIS — E11649 Type 2 diabetes mellitus with hypoglycemia without coma: Secondary | ICD-10-CM | POA: Insufficient documentation

## 2016-07-19 DIAGNOSIS — E162 Hypoglycemia, unspecified: Secondary | ICD-10-CM | POA: Diagnosis not present

## 2016-07-19 LAB — URINALYSIS, ROUTINE W REFLEX MICROSCOPIC
BACTERIA UA: NONE SEEN
Bilirubin Urine: NEGATIVE
Glucose, UA: NEGATIVE mg/dL
Hgb urine dipstick: NEGATIVE
Ketones, ur: 5 mg/dL — AB
Nitrite: NEGATIVE
PROTEIN: NEGATIVE mg/dL
Specific Gravity, Urine: 1.01 (ref 1.005–1.030)
Squamous Epithelial / LPF: NONE SEEN
pH: 7 (ref 5.0–8.0)

## 2016-07-19 LAB — CBC WITH DIFFERENTIAL/PLATELET
BASOS ABS: 0 10*3/uL (ref 0.0–0.1)
Basophils Relative: 1 %
EOS PCT: 3 %
Eosinophils Absolute: 0.1 10*3/uL (ref 0.0–0.7)
HEMATOCRIT: 40.7 % (ref 39.0–52.0)
Hemoglobin: 13.8 g/dL (ref 13.0–17.0)
LYMPHS ABS: 1.1 10*3/uL (ref 0.7–4.0)
LYMPHS PCT: 27 %
MCH: 30.7 pg (ref 26.0–34.0)
MCHC: 33.9 g/dL (ref 30.0–36.0)
MCV: 90.4 fL (ref 78.0–100.0)
MONO ABS: 0.3 10*3/uL (ref 0.1–1.0)
Monocytes Relative: 7 %
NEUTROS ABS: 2.6 10*3/uL (ref 1.7–7.7)
Neutrophils Relative %: 62 %
PLATELETS: 138 10*3/uL — AB (ref 150–400)
RBC: 4.5 MIL/uL (ref 4.22–5.81)
RDW: 14.1 % (ref 11.5–15.5)
WBC: 4.2 10*3/uL (ref 4.0–10.5)

## 2016-07-19 LAB — BASIC METABOLIC PANEL
ANION GAP: 12 (ref 5–15)
BUN: 9 mg/dL (ref 6–20)
CALCIUM: 8.6 mg/dL — AB (ref 8.9–10.3)
CO2: 26 mmol/L (ref 22–32)
Chloride: 101 mmol/L (ref 101–111)
Creatinine, Ser: 0.9 mg/dL (ref 0.61–1.24)
GFR calc Af Amer: >60 mL/min (ref >60)
GFR calc non Af Amer: >60 mL/min (ref >60)
Glucose, Bld: 156 mg/dL — ABNORMAL HIGH (ref 65–99)
POTASSIUM: 2.8 mmol/L — AB (ref 3.5–5.1)
Sodium: 139 mmol/L (ref 135–145)

## 2016-07-19 LAB — MAGNESIUM: MAGNESIUM: 2.1 mg/dL (ref 1.7–2.4)

## 2016-07-19 LAB — CBG MONITORING, ED
Glucose-Capillary: 160 mg/dL — ABNORMAL HIGH (ref 65–99)
Glucose-Capillary: 208 mg/dL — ABNORMAL HIGH (ref 65–99)
Glucose-Capillary: 289 mg/dL — ABNORMAL HIGH (ref 65–99)

## 2016-07-19 MED ORDER — POTASSIUM CHLORIDE CRYS ER 20 MEQ PO TBCR: 20.0000 meq | EXTENDED_RELEASE_TABLET | Freq: Every day | ORAL | 0 refills | Status: DC

## 2016-07-19 MED ORDER — MAGNESIUM OXIDE 400 (241.3 MG) MG PO TABS
400.0000 mg | ORAL_TABLET | Freq: Once | ORAL | Status: AC
  Administered 2016-07-19: 400 mg via ORAL
  Filled 2016-07-19: qty 1

## 2016-07-19 MED ORDER — POTASSIUM CHLORIDE CRYS ER 20 MEQ PO TBCR
40.0000 meq | EXTENDED_RELEASE_TABLET | Freq: Once | ORAL | Status: AC
  Administered 2016-07-19: 40 meq via ORAL
  Filled 2016-07-19: qty 2

## 2016-07-19 NOTE — ED Notes (Signed)
Gave pt turkey sandwich, crackers and diet gingerale 

## 2016-07-19 NOTE — ED Provider Notes (Signed)
[facility] DEPT Provider Note   CSN: [id number] Arrival date & time: 07/19/16  [id number]     History   Chief Complaint Chief Complaint  Patient presents with  . Hypoglycemia  . Altered Mental Status    HPI   Derrick [name] is a 76 y.o. male brought in by ambulance exam for hypoglycemia. Wife noticed this morning that he was clammy and nonresponsive in the bed, she checked his blood sugar and it read 33. She normally can put some chocolate in his mouth but his teeth were clenched, he was nonresponsive and nonverbal. She called 911 gave him an IV and D50. He responded appropriately coming alert and oriented, he was able to eat at the house. He was transferred to the ED for further evaluation. He's been in his normal state of health, eating and drinking normally with no chest pain, shortness of breath, nausea, vomiting, fever, chills or other issues over the last several days. There is been no change in his diabetic regimen. He only takes insulin he uses a 7030 NovoLog pen.  Past Medical History:  Diagnosis Date  . Cataract   . Complication of anesthesia    "high blood sugar; he's been loopy last 36h since OR"- 2016  . Diabetes mellitus   . DKA, type 2 ([place])   . Hearing loss   . Hypercholesteremia   . Hypertension   . Insulin pump in place   . Insulin pump in place   . Macular degeneration   . MCI (mild cognitive impairment) with memory loss 12/12/2014  . Obstructive sleep apnea   . OSA on CPAP 09/11/2014  . PONV (postoperative nausea and vomiting)   . Sleep apnea    cpap  . Uses hearing aid    right ear, patient stated he lost left hearing aid     Patient Active Problem List   Diagnosis Date Noted  . SOB (shortness of breath)   . Hypokalemia   . Diabetic ketoacidosis without coma associated with type 2 diabetes mellitus ([place]) 06/15/2016  . AKI (acute kidney injury) ([place]) 06/15/2016  . Diabetes mellitus ([place]) 10/29/2015  . HTN (hypertension) 10/29/2015  . S/P  laparoscopic cholecystectomy 10/28/2015  . OSA on CPAP 09/11/2014    Past Surgical History:  Procedure Laterality Date  . CHOLECYSTECTOMY N/A 10/28/2015   Procedure: LAPAROSCOPIC CHOLECYSTECTOMY WITH   INTRAOPERATIVE CHOLANGIOGRAM;  Surgeon: Greer Pickerel, MD;  Location: [facility] ORS;  Service: General;  Laterality: N/A;  . SHOULDER ARTHROSCOPY Right 08/24/11   "frozen shoulder; RCR w/labrum tear; arthritis scraping; bone spurs"  . SHOULDER ARTHROSCOPY Left ~ 2006  . TONSILLECTOMY  1949   "in childhood"       Home Medications    Prior to Admission medications   Medication Sig Start Date End Date Taking? Authorizing Provider  amLODipine (NORVASC) 10 MG tablet Take 10 mg by mouth every evening.    Yes Historical Provider, MD  aspirin EC 81 MG tablet Take 81 mg by mouth daily.   Yes Historical Provider, MD  donepezil (ARICEPT) 10 MG tablet Take 10 mg by mouth at bedtime.   Yes Historical Provider, MD  insulin aspart protamine - aspart (NOVOLOG 70/30 MIX) (70-30) 100 UNIT/ML FlexPen Inject 0.3 mLs (30 Units total) into the skin 2 (two) times daily with a meal. 06/18/16  Yes Donne Hazel, MD  labetalol (NORMODYNE) 200 MG tablet Take 200 mg by mouth 2 (two) times daily. 07/08/15  Yes Historical Provider, MD  lisinopril-hydrochlorothiazide (PRINZIDE,ZESTORETIC) 20-12.5 MG  per tablet Take 1 tablet by mouth 2 (two) times daily.    Yes Historical Provider, MD  Multiple Vitamins-Minerals (PRESERVISION AREDS 2 PO) Take 1 capsule by mouth 2 (two) times daily.   Yes Historical Provider, MD  rosuvastatin (CRESTOR) 20 MG tablet Take 20 mg by mouth daily.   Yes Historical Provider, MD  insulin aspart (NOVOLOG) 100 UNIT/ML injection Inject 0-15 Units into the skin 3 (three) times daily with meals. Check blood sugars every 4 hours while awake and use the Humalog pen: Greater than 350 give 12 units, 251-350 give 8 units, 151-250 give 4 units. Patient not taking: Reported on 07/19/2016 08/28/11 07/19/16  Burnard Bunting,  MD  potassium chloride SA (K-DUR,KLOR-CON) 20 MEQ tablet Take 1 tablet (20 mEq total) by mouth daily. 07/19/16   Elmyra Ricks Faraz Ponciano, PA-C    Family History Family History  Problem Relation Age of Onset  . Heart disease Father   . High blood pressure Father   . Colon cancer Neg Hx     Social History Social History  Substance Use Topics  . Smoking status: Former Smoker    Packs/day: 0.25    Years: 15.00    Types: Cigarettes    Quit date: 06/23/1983  . Smokeless tobacco: Never Used  . Alcohol use 0.0 oz/week     Comment: minimal     Allergies   Patient has no known allergies.   Review of Systems Review of Systems  10 systems reviewed and found to be negative, except as noted in the HPI.   Physical Exam Updated Vital Signs BP 126/56   Pulse 70   Temp 98.9 F (37.2 C) (Oral)   Resp 15   SpO2 96%   Physical Exam  Constitutional: He is oriented to person, place, and time. He appears well-developed and well-nourished. No distress.  HENT:  Head: Normocephalic and atraumatic.  Mouth/Throat: Oropharynx is clear and moist.  Eyes: Conjunctivae and EOM are normal. Pupils are equal, round, and reactive to light.  Neck: Normal range of motion.  Cardiovascular: Normal rate, regular rhythm and intact distal pulses.   Pulmonary/Chest: Effort normal and breath sounds normal.  Abdominal: Soft. There is no tenderness.  Musculoskeletal: Normal range of motion.  Neurological: He is alert and oriented to person, place, and time.  Strength 5 out of 54 extremities, clear and goal oriented speech. Oriented 3  Skin: He is not diaphoretic.  Psychiatric: He has a normal mood and affect.  Nursing note and vitals reviewed.    ED Treatments / Results  Labs (all labs ordered are listed, but only abnormal results are displayed) Labs Reviewed  CBC WITH DIFFERENTIAL/PLATELET - Abnormal; Notable for the following:       Result Value   Platelets 138 (*)    All other components within  normal limits  BASIC METABOLIC PANEL - Abnormal; Notable for the following:    Potassium 2.8 (*)    Glucose, Bld 156 (*)    Calcium 8.6 (*)    All other components within normal limits  URINALYSIS, ROUTINE W REFLEX MICROSCOPIC - Abnormal; Notable for the following:    Color, Urine STRAW (*)    Ketones, ur 5 (*)    Leukocytes, UA TRACE (*)    All other components within normal limits  CBG MONITORING, ED - Abnormal; Notable for the following:    Glucose-Capillary 160 (*)    All other components within normal limits  CBG MONITORING, ED - Abnormal; Notable for the following:  Glucose-Capillary 208 (*)    All other components within normal limits  CBG MONITORING, ED - Abnormal; Notable for the following:    Glucose-Capillary 289 (*)    All other components within normal limits  URINE CULTURE  MAGNESIUM    EKG  EKG Interpretation  Date/Time:  Sunday July 19 2016 08:01:19 EST Ventricular Rate:  68 PR Interval:    QRS Duration: 110 QT Interval:  440 QTC Calculation: 468 R Axis:   -25 Text Interpretation:  Sinus rhythm Borderline left axis deviation Borderline T abnormalities, inferior leads st changes similar to 21 nov 05 Otherwise no significant change Confirmed by Tyrone Nine MD, Quillian Quince [id number]) on 07/19/2016 8:40:53 AM Also confirmed by Tyrone Nine MD, DANIEL [phone]), editor [place] CT, Leda Gauze [id number])  on 07/19/2016 11:04:17 AM       Radiology No results found.  Procedures Procedures (including critical care time)  Medications Ordered in ED Medications  potassium chloride SA (K-DUR,KLOR-CON) CR tablet 40 mEq (40 mEq Oral Given 07/19/16 0802)  magnesium oxide (MAG-OX) tablet 400 mg (400 mg Oral Given 07/19/16 0845)     Initial Impression / Assessment and Plan / ED Course  I have reviewed the triage vital signs and the nursing notes.  Pertinent labs & imaging results that were available during my care of the patient were reviewed by me and considered in my medical decision making  (see chart for details).    Vitals:   07/19/16 0700 07/19/16 0745 07/19/16 0830 07/19/16 0915  BP: 121/59 136/64 129/72 126/56  Pulse: 64 71 67 70  Resp: 16 14 18 15   Temp:      TempSrc:      SpO2: 95% 96% 98% 96%    Medications  potassium chloride SA (K-DUR,KLOR-CON) CR tablet 40 mEq (40 mEq Oral Given 07/19/16 0802)  magnesium oxide (MAG-OX) tablet 400 mg (400 mg Oral Given 07/19/16 0845)    Jsaon Plazola Blackwelder is 76 y.o. male presenting with hypoglycemia, his wife noticed that he was clammy and unresponsive in the bed this morning, she states his blood sugar and it was in the 30s. EMS gave D50 IV and patient has responded appropriately, he is alert, oriented 3, nonfocal neurologic exam. No recent medication changes. He's not taking any oral hypoglycemics would be long acting. He only takes insulin. Will check basic blood work and observe.  Normal creatinine, his potassium is mildly low at 2.8. He does not take any diuretics. Will check magnesium and replete orally.  Normal magnesium, recheck of blood sugar is normal, he is eating and drinking normally. Advised him to follow closely with primary care.  This is a shared visit with the attending physician who personally evaluated the patient and agrees with the care plan.   Evaluation does not show pathology that would require ongoing emergent intervention or inpatient treatment. Pt is hemodynamically stable and mentating appropriately. Discussed findings and plan with patient/guardian, who agrees with care plan. All questions answered. Return precautions discussed and outpatient follow up given.      Final Clinical Impressions(s) / ED Diagnoses   Final diagnoses:  Hypoglycemia  Hypokalemia    New Prescriptions Discharge Medication List as of 07/19/2016  9:15 AM    START taking these medications   Details  potassium chloride SA (K-DUR,KLOR-CON) 20 MEQ tablet Take 1 tablet (20 mEq total) by mouth daily., Starting Sun  07/19/2016, Print         Kyley Laurel, PA-C 07/19/16 [place], DO 07/19/16 [id number]

## 2016-07-19 NOTE — ED Triage Notes (Signed)
Pt presents from [facility] for altered mental status and hypoglycemia. Given D10 and oral glucose tablets. Alert but not back to baseline per wife

## 2016-07-19 NOTE — Discharge Instructions (Signed)
Please follow with your primary care doctor in the next 2 days for a check-up. They must obtain records for further management.  ° °Do not hesitate to return to the Emergency Department for any new, worsening or concerning symptoms.  ° °

## 2016-07-20 DIAGNOSIS — Z9641 Presence of insulin pump (external) (internal): Secondary | ICD-10-CM | POA: Diagnosis not present

## 2016-07-20 DIAGNOSIS — R2681 Unsteadiness on feet: Secondary | ICD-10-CM | POA: Diagnosis not present

## 2016-07-20 DIAGNOSIS — N182 Chronic kidney disease, stage 2 (mild): Secondary | ICD-10-CM | POA: Diagnosis not present

## 2016-07-20 DIAGNOSIS — M6281 Muscle weakness (generalized): Secondary | ICD-10-CM | POA: Diagnosis not present

## 2016-07-20 DIAGNOSIS — E1165 Type 2 diabetes mellitus with hyperglycemia: Secondary | ICD-10-CM | POA: Diagnosis not present

## 2016-07-20 DIAGNOSIS — I10 Essential (primary) hypertension: Secondary | ICD-10-CM | POA: Diagnosis not present

## 2016-07-20 LAB — URINE CULTURE

## 2016-07-22 DIAGNOSIS — N182 Chronic kidney disease, stage 2 (mild): Secondary | ICD-10-CM | POA: Diagnosis not present

## 2016-07-22 DIAGNOSIS — H353212 Exudative age-related macular degeneration, right eye, with inactive choroidal neovascularization: Secondary | ICD-10-CM | POA: Diagnosis not present

## 2016-07-22 DIAGNOSIS — Z9641 Presence of insulin pump (external) (internal): Secondary | ICD-10-CM | POA: Diagnosis not present

## 2016-07-22 DIAGNOSIS — H353122 Nonexudative age-related macular degeneration, left eye, intermediate dry stage: Secondary | ICD-10-CM | POA: Diagnosis not present

## 2016-07-22 DIAGNOSIS — R2681 Unsteadiness on feet: Secondary | ICD-10-CM | POA: Diagnosis not present

## 2016-07-22 DIAGNOSIS — I10 Essential (primary) hypertension: Secondary | ICD-10-CM | POA: Diagnosis not present

## 2016-07-22 DIAGNOSIS — E1165 Type 2 diabetes mellitus with hyperglycemia: Secondary | ICD-10-CM | POA: Diagnosis not present

## 2016-07-22 DIAGNOSIS — M6281 Muscle weakness (generalized): Secondary | ICD-10-CM | POA: Diagnosis not present

## 2016-07-24 DIAGNOSIS — E1129 Type 2 diabetes mellitus with other diabetic kidney complication: Secondary | ICD-10-CM | POA: Diagnosis not present

## 2016-07-24 DIAGNOSIS — Z6829 Body mass index (BMI) 29.0-29.9, adult: Secondary | ICD-10-CM | POA: Diagnosis not present

## 2016-07-24 DIAGNOSIS — E162 Hypoglycemia, unspecified: Secondary | ICD-10-CM | POA: Diagnosis not present

## 2016-07-27 DIAGNOSIS — I10 Essential (primary) hypertension: Secondary | ICD-10-CM | POA: Diagnosis not present

## 2016-07-27 DIAGNOSIS — R2681 Unsteadiness on feet: Secondary | ICD-10-CM | POA: Diagnosis not present

## 2016-07-27 DIAGNOSIS — E1165 Type 2 diabetes mellitus with hyperglycemia: Secondary | ICD-10-CM | POA: Diagnosis not present

## 2016-07-27 DIAGNOSIS — M6281 Muscle weakness (generalized): Secondary | ICD-10-CM | POA: Diagnosis not present

## 2016-07-27 DIAGNOSIS — Z9641 Presence of insulin pump (external) (internal): Secondary | ICD-10-CM | POA: Diagnosis not present

## 2016-07-27 DIAGNOSIS — E785 Hyperlipidemia, unspecified: Secondary | ICD-10-CM | POA: Diagnosis not present

## 2016-07-27 DIAGNOSIS — N182 Chronic kidney disease, stage 2 (mild): Secondary | ICD-10-CM | POA: Diagnosis not present

## 2016-07-29 DIAGNOSIS — R2681 Unsteadiness on feet: Secondary | ICD-10-CM | POA: Diagnosis not present

## 2016-07-29 DIAGNOSIS — Z9641 Presence of insulin pump (external) (internal): Secondary | ICD-10-CM | POA: Diagnosis not present

## 2016-07-29 DIAGNOSIS — I10 Essential (primary) hypertension: Secondary | ICD-10-CM | POA: Diagnosis not present

## 2016-07-29 DIAGNOSIS — M6281 Muscle weakness (generalized): Secondary | ICD-10-CM | POA: Diagnosis not present

## 2016-07-29 DIAGNOSIS — E1165 Type 2 diabetes mellitus with hyperglycemia: Secondary | ICD-10-CM | POA: Diagnosis not present

## 2016-07-29 DIAGNOSIS — N182 Chronic kidney disease, stage 2 (mild): Secondary | ICD-10-CM | POA: Diagnosis not present

## 2016-08-11 DIAGNOSIS — B351 Tinea unguium: Secondary | ICD-10-CM | POA: Diagnosis not present

## 2016-08-11 DIAGNOSIS — E1159 Type 2 diabetes mellitus with other circulatory complications: Secondary | ICD-10-CM | POA: Diagnosis not present

## 2016-08-11 DIAGNOSIS — L84 Corns and callosities: Secondary | ICD-10-CM | POA: Diagnosis not present

## 2016-08-25 DIAGNOSIS — E113393 Type 2 diabetes mellitus with moderate nonproliferative diabetic retinopathy without macular edema, bilateral: Secondary | ICD-10-CM | POA: Diagnosis not present

## 2016-08-31 DIAGNOSIS — I10 Essential (primary) hypertension: Secondary | ICD-10-CM | POA: Diagnosis not present

## 2016-08-31 DIAGNOSIS — E784 Other hyperlipidemia: Secondary | ICD-10-CM | POA: Diagnosis not present

## 2016-08-31 DIAGNOSIS — E1129 Type 2 diabetes mellitus with other diabetic kidney complication: Secondary | ICD-10-CM | POA: Diagnosis not present

## 2016-08-31 DIAGNOSIS — Z125 Encounter for screening for malignant neoplasm of prostate: Secondary | ICD-10-CM | POA: Diagnosis not present

## 2016-09-01 DIAGNOSIS — H25041 Posterior subcapsular polar age-related cataract, right eye: Secondary | ICD-10-CM | POA: Diagnosis not present

## 2016-09-01 DIAGNOSIS — H2511 Age-related nuclear cataract, right eye: Secondary | ICD-10-CM | POA: Diagnosis not present

## 2016-09-01 DIAGNOSIS — H25811 Combined forms of age-related cataract, right eye: Secondary | ICD-10-CM | POA: Diagnosis not present

## 2016-09-07 DIAGNOSIS — R2689 Other abnormalities of gait and mobility: Secondary | ICD-10-CM | POA: Diagnosis not present

## 2016-09-07 DIAGNOSIS — E162 Hypoglycemia, unspecified: Secondary | ICD-10-CM | POA: Diagnosis not present

## 2016-09-07 DIAGNOSIS — R5381 Other malaise: Secondary | ICD-10-CM | POA: Diagnosis not present

## 2016-09-07 DIAGNOSIS — Z1389 Encounter for screening for other disorder: Secondary | ICD-10-CM | POA: Diagnosis not present

## 2016-09-07 DIAGNOSIS — E1129 Type 2 diabetes mellitus with other diabetic kidney complication: Secondary | ICD-10-CM | POA: Diagnosis not present

## 2016-09-07 DIAGNOSIS — R413 Other amnesia: Secondary | ICD-10-CM | POA: Diagnosis not present

## 2016-09-07 DIAGNOSIS — Z794 Long term (current) use of insulin: Secondary | ICD-10-CM | POA: Diagnosis not present

## 2016-09-07 DIAGNOSIS — Z0000 Encounter for general adult medical examination without abnormal findings: Secondary | ICD-10-CM | POA: Diagnosis not present

## 2016-09-07 DIAGNOSIS — I129 Hypertensive chronic kidney disease with stage 1 through stage 4 chronic kidney disease, or unspecified chronic kidney disease: Secondary | ICD-10-CM | POA: Diagnosis not present

## 2016-09-07 DIAGNOSIS — Z23 Encounter for immunization: Secondary | ICD-10-CM | POA: Diagnosis not present

## 2016-09-07 DIAGNOSIS — Z6827 Body mass index (BMI) 27.0-27.9, adult: Secondary | ICD-10-CM | POA: Diagnosis not present

## 2016-09-07 DIAGNOSIS — N182 Chronic kidney disease, stage 2 (mild): Secondary | ICD-10-CM | POA: Diagnosis not present

## 2016-09-07 DIAGNOSIS — E038 Other specified hypothyroidism: Secondary | ICD-10-CM | POA: Diagnosis not present

## 2016-09-22 ENCOUNTER — Telehealth: Payer: Self-pay | Admitting: Neurology

## 2016-09-22 NOTE — Telephone Encounter (Signed)
I have an new referral for this patient however it is to Dr. Brett Fairy for CPAP Supplies.  I am sending this message so someone can schedule the patient for a return office visit or order his supplies.  Patient was last seen 07/23/15

## 2016-09-22 NOTE — Telephone Encounter (Signed)
LM for patient to call back for an appointment with either Dr. Brett Fairy or one of the NPs. Patient needs f/u appt so we can send new orders in for his CPAP supplies.

## 2016-10-13 DIAGNOSIS — L84 Corns and callosities: Secondary | ICD-10-CM | POA: Diagnosis not present

## 2016-10-13 DIAGNOSIS — B351 Tinea unguium: Secondary | ICD-10-CM | POA: Diagnosis not present

## 2016-10-13 DIAGNOSIS — E1159 Type 2 diabetes mellitus with other circulatory complications: Secondary | ICD-10-CM | POA: Diagnosis not present

## 2016-11-04 ENCOUNTER — Encounter (INDEPENDENT_AMBULATORY_CARE_PROVIDER_SITE_OTHER): Payer: Self-pay

## 2016-11-04 ENCOUNTER — Encounter: Payer: Self-pay | Admitting: Neurology

## 2016-11-04 ENCOUNTER — Ambulatory Visit (INDEPENDENT_AMBULATORY_CARE_PROVIDER_SITE_OTHER): Payer: Medicare Other | Admitting: Neurology

## 2016-11-04 VITALS — BP 158/75 | HR 63 | Ht 68.0 in | Wt 163.0 lb

## 2016-11-04 DIAGNOSIS — Z9989 Dependence on other enabling machines and devices: Secondary | ICD-10-CM | POA: Diagnosis not present

## 2016-11-04 DIAGNOSIS — E0801 Diabetes mellitus due to underlying condition with hyperosmolarity with coma: Secondary | ICD-10-CM | POA: Diagnosis not present

## 2016-11-04 DIAGNOSIS — G5602 Carpal tunnel syndrome, left upper limb: Secondary | ICD-10-CM

## 2016-11-04 DIAGNOSIS — G4733 Obstructive sleep apnea (adult) (pediatric): Secondary | ICD-10-CM | POA: Diagnosis not present

## 2016-11-04 NOTE — Progress Notes (Signed)
SLEEP MED[facility]INE CLINIC   Provider:  Larey Seat, M D  Referring Provider: Burnard Bunting, MD Primary Care Physician:  Burnard Bunting, MD  Chief Complaint  Patient presents with  . Follow-up    cpap    HPI:  Derrick [name] is a 76 y.o. male , seen here as a referral from Dr. [name] for a re evaluation of sleep apnea and CPAP treatment.  I have seen this patient last time 4 years ago in an office visit. Originally referred by Dr. [name] then,  this patient came to me initially because his wife was concerned about his snoring and occasional pausing in his breath. The patient had reported that he had gained weight prior to being finally evaluated by the sleep study he had a history of hypertension, hearing loss, diabetes and a remote tobacco use history of about 40 pack years. His he had also reported nocturia at the time. The patient's sleep study took place on 1-30 06-2010. And revealed an AHI that off 24.1 per hour of sleep. He also had 11 central apneas but the vast majority were 118 obstructive apneas. There was not much of a positional component to his sleep but in rem sleep his AHI exacerbated 258.9 the patient's lowest oxygen saturation was 76% was 21 minutes of desaturation time and Toprol, returned for a CPAP titration and was titrated to 12 cm water. In his first download he had a 90% reduction of apneas and reached an AHI of 2.5. Also he was happy with the CPAP results he was initially not liking it too much. Patient is an insulin pump where a and had also reported that he had taken Celebrex when I last saw him in 2012 and he was concerned that some of his lower extremity edema that he presented with may have been Celebrex related. [name] tells me today that he has used his CPAP machine and his current Epworth sleepiness score is endorsed at 10 points and his fatigue severity at 18 points. He does have a higher blood pressure and in his review of systems endorsed snoring  hearing loss, erectile dysfunction and aching muscles, also bilateral shoulder injuries rotator cuff injuries.  12-12-14  Rv for this patient with OSA on a new CPAP. The patient reports today that he is very happy with this CPAP machine he is using it 100% of the time a download confirms 30 per 30 days out of 30 days of use 8 hours and 53 minutes on average 100% compliance. He is using an O2 sat between 5 and 15 cm water with 3 cm EPR. The 91st percentile of pressure is 11.6 cm water for this reason I would like to release the patient on an auto set machine instead of deciding on a set pressure.  His residual AHI is 4.0 which is sufficient and actually the patient is satisfied with the machine and use. He also comments that his spouse appreciates the very quiet character of the machine. He is using a large nasal pillow, has kept his sleep habits.  The patient reports that he has to blow his nose in the morning and that he has quite a bit of mucus discharge. This is likely related to the humidifier settings. As long as his nose is not obstructed or congested I have no problem with him using the current settings. His Epworth sleepiness score is 5 points  and his fatigue severity score 15 points -  very good.  07-23-15, here for  his regular yearly compliance visit with CPAP. -over the last 30 days he used his machine 100% of the time and over 4 hours 400% of the time. Average user time 9 hours and 23 minutes. AutoSet between 5 and 15 cm water with 3 cm EPR, 91st percentile 10.6 cm water. AHI 2.5. No changes or new settings are necessary. Fatigue severity score was endorsed at 25 total score for the Epworth Sleepiness Scale was 2 points.  His wife and daughter have voiced concerns about his cognition, the couple moved in12-2016 to [facility]. He started Aricept at 5 mg daily just this month.   I recommended melatonin for PRN insomnia. He uses aleve pm.   Interval history from 11/04/2016, [name]  presents today after 2 hospitalizations for diabetes ketoacidosis. Last May at this time he was in hospital for a cholecystectomy. He has had no trouble with his abdominal function since. However his diabetes has been difficult to control at times. He has lost 30 pounds since, he resides with his wife at friends home [employer] and keeps his diet by controlling his overall portion size but also the choice of food he makes. He has 100% compliance for his AutoSet CPAP which covers a range between 5 and 15 cm water pressure with an EPR level of 3 cm water, AHI is 2.4 excellent resolution. Also my patient lost a significant amount of weight is 91st percentile pressure is still covered by the current settings and is now at 9.3 cm water. He has only a few central apneas and I did do not see a reason to restrict his window of pressure. He reports hand pain, likely carpal tunnel syndrome.       Review of Systems: Out of a complete 14 system review, the patient complains of only the following symptoms, and all other reviewed systems are negative.  In  his review of systems endorsed snoring hearing loss, erectile dysfunction and aching muscles, also bilateral shoulder injuries rotator cuff injuries. Left hand pain - carpal tunnel.  Referral to Dr Fredna Dow.    Epworth score  1 , Fatigue severity score   35   , depression score  0/15   Social History   Social History  . Marital status: Married    Spouse name: Derrick [name]   . Number of children: 2  . Years of education: masters   Occupational History  .      Retired.   Social History Main Topics  . Smoking status: Former Smoker    Packs/day: 0.25    Years: 15.00    Types: Cigarettes    Quit date: 06/23/1983  . Smokeless tobacco: Never Used  . Alcohol use 0.0 oz/week     Comment: minimal  . Drug use: No  . Sexual activity: Yes   Other Topics Concern  . Not on file   Social History Narrative   Patient lives at home with his wife Derrick [name]).    Retired.   Education Master's degree.   Right handed.   Caffeine three cups of coffee daily.    Family History  Problem Relation Age of Onset  . Heart disease Father   . High blood pressure Father   . Colon cancer Neg Hx     Past Medical History:  Diagnosis Date  . Cataract   . Complication of anesthesia    "high blood sugar; he's been loopy last 36h since OR"- 2016  . Diabetes mellitus   . DKA, type 2 (  [facility])   . Hearing loss   . Hypercholesteremia   . Hypertension   . Insulin pump in place   . Insulin pump in place   . Macular degeneration   . MCI (mild cognitive impairment) with memory loss 12/12/2014  . Obstructive sleep apnea   . OSA on CPAP 09/11/2014  . PONV (postoperative nausea and vomiting)   . Sleep apnea    cpap  . Uses hearing aid    right ear, patient stated he lost left hearing aid     Past Surgical History:  Procedure Laterality Date  . CHOLECYSTECTOMY N/A 10/28/2015   Procedure: LAPAROSCOPIC CHOLECYSTECTOMY WITH   INTRAOPERATIVE CHOLANGIOGRAM;  Surgeon: Greer Pickerel, MD;  Location: [facility];  Service: General;  Laterality: N/A;  . SHOULDER ARTHROSCOPY Right 08/24/11   "frozen shoulder; RCR w/labrum tear; arthritis scraping; bone spurs"  . SHOULDER ARTHROSCOPY Left ~ 2006  . TONSILLECTOMY  1949   "in childhood"    Current Outpatient Prescriptions  Medication Sig Dispense Refill  . amLODipine (NORVASC) 10 MG tablet Take 10 mg by mouth every evening.     Marland Kitchen aspirin EC 81 MG tablet Take 81 mg by mouth daily.    Marland Kitchen donepezil (ARICEPT) 10 MG tablet Take 10 mg by mouth at bedtime.    . Insulin Glargine (BASAGLAR KWIKPEN) 100 UNIT/ML SOPN Inject 27 Units into the skin daily.    Marland Kitchen labetalol (NORMODYNE) 200 MG tablet Take 200 mg by mouth 2 (two) times daily.  7  . levothyroxine (SYNTHROID, LEVOTHROID) 25 MCG tablet Take 25 mcg by mouth daily before breakfast.    . lisinopril-hydrochlorothiazide (PRINZIDE,ZESTORETIC) 20-12.5 MG per tablet Take 1 tablet by mouth 2 (two)  times daily.     . Multiple Vitamins-Minerals (PRESERVISION AREDS 2 PO) Take 1 capsule by mouth 2 (two) times daily.    . potassium chloride SA (K-DUR,KLOR-CON) 20 MEQ tablet Take 1 tablet (20 mEq total) by mouth daily. 3 tablet 0  . rosuvastatin (CRESTOR) 20 MG tablet Take 20 mg by mouth daily.     No current facility-administered medications for this visit.     Allergies as of 11/04/2016  . (No Known Allergies)    Vitals: BP (!) 158/75   Pulse 63   Ht 5\' 8"  (1.727 m)   Wt 163 lb (73.9 kg)   BMI 24.78 kg/m  Last Weight:  Wt Readings from Last 1 Encounters:  11/04/16 163 lb (73.9 kg)       Last Height:   Ht Readings from Last 1 Encounters:  11/04/16 5\' 8"  (1.727 m)    Physical exam:  General: The patient is awake, alert and appears not in acute distress. The patient is well groomed. Head: Normocephalic, atraumatic. Neck is supple. Mallampati 3,  neck circumference: 18 . Nasal airflow unrestricted , TMJ is not evident. Retrognathia is mild .  Cardiovascular:  Regular rate and rhythm, without murmurs or carotid bruit, and without distended neck veins. Respiratory: Lungs are clear to auscultation. Skin:  Without evidence of edema, or rash Trunk: BMI is elevated and patient  has normal posture.  Neurologic exam : The patient is awake and alert, oriented to place and time.   Memory subjective  described as intact.  There is a normal attention span & concentration ability. Speech is fluent without dysarthria, dysphonia or aphasia. Mood and affect are appropriate.  Cranial nerves: Pupils are equal and extraocular movements  in vertical and horizontal planes intact and without nystagmus. Visual fields by finger  perimetry are intact. Hearing to finger rub right more impaired than left.Facial sensation intact to fine touch.  Facial motor strength is symmetric and tongue and uvula move midline. Motor exam: thenar eminence atrophy left, loss of grip strength. Deep tendon reflexes:  in the  upper and lower extremities are attenauted, symmetric and intact.  Babinski maneuver response is downgoing. Assessment:  After physical and neurologic examination, review of laboratory studies, imaging, neurophysiology testing and pre-existing records, assessment is   1)  The CPAPs auto setting is between 5 cm and 15 cm maximum pressure with 3 cm pressure relief.  The residual AHI is satisfying at 2.4 .  I would like for him to continue using an AutoSet machine, I will send a note of compliance  to [facility].  2) painful carpal tunnel syndrome on the left , uses a wrist brace. There is atrophy of the thenar eminence noted, lack of grip strength and pinch strengths. Positive Tinel sign. I will ask for nerve conduction study EMG here and send the results over to Dr. Fredna Dow.  3) 2 admissions for diabetic ketoacidosis, Lost significant weight.  OSA needs still covered by autoset CPAP.   Sincerely,  Larey Seat M.D.   Asencion Partridge Tiarna Koppen MD  11/04/2016

## 2016-11-12 ENCOUNTER — Encounter (INDEPENDENT_AMBULATORY_CARE_PROVIDER_SITE_OTHER): Payer: Self-pay

## 2016-11-12 ENCOUNTER — Ambulatory Visit (INDEPENDENT_AMBULATORY_CARE_PROVIDER_SITE_OTHER): Payer: Medicare Other | Admitting: Diagnostic Neuroimaging

## 2016-11-12 DIAGNOSIS — E0801 Diabetes mellitus due to underlying condition with hyperosmolarity with coma: Secondary | ICD-10-CM

## 2016-11-12 DIAGNOSIS — G5602 Carpal tunnel syndrome, left upper limb: Secondary | ICD-10-CM | POA: Diagnosis not present

## 2016-11-12 DIAGNOSIS — Z0289 Encounter for other administrative examinations: Secondary | ICD-10-CM

## 2016-11-12 NOTE — Procedures (Signed)
[facility]  NCS (NERVE CONDUCTION STUDY) WITH EMG (ELECTROMYOGRAPHY) REPORT   STUDY DATE: 11/11/16 PATIENT NAME: Derrick [name] DOB: [birth year] MRN: [id number]  ORDERING CLINICIAN: Larey Seat, MD   TECHNOLOGIST: Oneita Jolly ELECTROMYOGRAPHER: Earlean Polka. Mckell Riecke, MD  CLINICAL INFORMATION: 76 year old male with left hand pain. History of diabetes.   FINDINGS: NERVE CONDUCTION STUDY: Left median motor response has prolonged distal latency (8.5 ms) decreased amplitude, normal conduction velocity.  Right median motor response has prolonged distal latency (6.8 ms), normal amplitude, normal conduction velocity.  Bilateral median motor responses are normal except left ulnar motor response has slightly slow conduction velocity was diminished below the elbow (46 m/s).  Right peroneal and right tibial motor responses have prolonged distal latencies, decreased amplitudes, slow conduction velocities.  Bilateral median, bilateral ulnar, right sural sensory responses could not be obtained.   Bilateral radial sensory responses have prolonged peak latencies and decreased amplitudes.     NEEDLE ELECTROMYOGRAPHY: Needle examination of left upper extremity deltoid, biceps, triceps, flexor carpi radialis is normal.  Left first dorsal interosseous has occasional fasciculations at rest and decreased motor unit recruitment on exertion.   IMPRESSION:  Abnormal study demonstrating: 1. Underlying widespread axonal sensory motor polyneuropathy, likely related to underlying diabetes. 2. Superimposed left greater than right median neuropathies at the wrists consistent with superimposed left greater than right carpal tunnel syndrome.  3. Superimposed left ulnar neuropathy which cannot be further localized.      INTERPRETING PHYSICIAN:  Penni Bombard, MD Certified in Neurology, Neurophysiology and Neuroimaging  [facility] [address] [place], [place] [address] [phone]   [facility]    Nerve / Sites Muscle Latency Ref. Amplitude Ref. Rel Amp Segments Distance Velocity Ref. Area    ms ms mV mV %  cm m/s m/s mVms  L Median - APB     Wrist APB 8.5 ?4.4 2.2 ?4.0 100 Wrist - APB 7   9.6     Upper arm APB 12.7  1.1  47.6 Upper arm - Wrist 23 55 ?49 5.0  R Median - APB     Wrist APB 6.8 ?4.4 4.4 ?4.0 100 Wrist - APB 7   15.4     Upper arm APB 11.4  5.3  121 Upper arm - Wrist 24 52 ?49 18.2  L Ulnar - ADM     Wrist ADM 2.9 ?3.3 6.3 ?6.0 100 Wrist - ADM 7   23.5     B.Elbow ADM 7.2  5.7  90 B.Elbow - Wrist 20 46 ?49 23.6     A.Elbow ADM 9.2  5.6  99 A.Elbow - B.Elbow 10 51 ?49 24.2         A.Elbow - Wrist      R Ulnar - ADM     Wrist ADM 2.9 ?3.3 5.7 ?6.0 100 Wrist - ADM 7   21.2     B.Elbow ADM 6.9  5.0  87.9 B.Elbow - Wrist 20 49 ?49 22.1     A.Elbow ADM 9.0  5.1  102 A.Elbow - B.Elbow 10 49 ?49 22.3         A.Elbow - Wrist      R Peroneal - EDB     Ankle EDB 6.3 ?6.5 0.8 ?2.0 100 Ankle - EDB 9   2.0     Fib head EDB 15.5  0.7  87.5 Fib head - Ankle 29 32 ?44 1.7     Pop fossa EDB 18.4  0.6  80.9 Pop fossa - Fib head 10 34 ?44 1.5         Pop fossa - Ankle      R Tibial - AH     Ankle AH 5.8 ?5.8 1.2 ?4.0 100 Ankle - AH 9   2.4     Pop fossa AH 17.8  0.8  66.9 Pop fossa - Ankle 40 34 ?41 2.6                 SNC    Nerve / Sites Rec. Site Peak Lat Ref.  Amp Ref. Segments Distance    ms ms V V  cm  L Ulnar - Digit V (Antidromic)     Wrist Dig V NR ?3.1 NR ?17 Wrist - Dig V 11  L Radial - Anatomical snuff box (Forearm)     Forearm Wrist 3.1 ?2.9 5 ?15 Forearm - Wrist 10  R Radial - Anatomical snuff box (Forearm)     Forearm Wrist 3.2 ?2.9 3 ?15 Forearm - Wrist 10  R Sural - Ankle (Calf)     Calf Ankle NR ?4.4 NR ?6 Calf - Ankle 14  L Median - Orthodromic (Dig II, Mid palm)     Dig II Wrist NR ?3.4 NR ?10 Dig II - Wrist 13  R Median - Orthodromic (Dig II, Mid palm)     Dig II Wrist NR ?3.4 NR ?10  Dig II - Wrist 13  L Ulnar - Orthodromic, (Dig V, Mid palm)     Dig V Wrist NR ?3.1 NR ?5 Dig V - Wrist 11  R Ulnar - Orthodromic, (Dig V, Mid palm)     Dig V Wrist NR ?3.1 NR ?5 Dig V - Wrist 57                     F  Wave    Nerve F Lat Ref.   ms ms  L Ulnar - ADM 31.3 ?32.0  R Ulnar - ADM 28.3 ?32.0  R Tibial - AH 60.9 ?56.0           EMG full       EMG Summary Table    Spontaneous MUAP Recruitment  Muscle IA Fib PSW Fasc Other Amp Dur. Poly Pattern  L. Deltoid Normal None None None _______ Normal Normal Normal Normal  L. Biceps brachii Normal None None None _______ Normal Normal Normal Normal  L. Triceps brachii Normal None None None _______ Normal Normal Normal Normal  L. Flexor carpi radialis Normal None None None _______ Normal Normal Normal Normal  L. First dorsal interosseous Normal None None Occasional _______ Increased Normal Normal Reduced

## 2016-11-18 ENCOUNTER — Telehealth: Payer: Self-pay

## 2016-11-18 NOTE — Telephone Encounter (Signed)
Patient called office returning RN's call.  Please call °

## 2016-11-18 NOTE — Telephone Encounter (Signed)
I called pt to discuss NCV/EMG results. Pt's wife answered the phone and advised me that he will call me back later.

## 2016-11-18 NOTE — Telephone Encounter (Signed)
-----   Message from Larey Seat, MD sent at 11/13/2016  2:47 PM EDT ----- 1)Axonal sensory neuropathy, peripherally.  2) 3 mononeuropathies ( median carpal tunnel - left more than right side.) left ulnar neuropathy.  Results an work sheet to be shared with Dr Fredna Dow, whom the patient named as his Copy. CD

## 2016-11-18 NOTE — Telephone Encounter (Signed)
I called pt again. I advised him that I spoke with our MR dept and we cannot send these results to the hand surgeon DR Amedeo Plenty, unless he signs a MR release allowing it, or he sends our MR department an email granting [place] permission. Pt says that he is willing to come pick up the results and sign a release for them. Pt declined a follow up with Dr. Brett Fairy at this time to discuss treatment for neuropathy. He say that he wants to "get my hand fixed first." I will place a copy of pt's NCV/EMG results at the front desk for pick up with a MR form. Pt verbalized understanding of results. Pt had no questions at this time but was encouraged to call back if questions arise.

## 2016-11-18 NOTE — Telephone Encounter (Signed)
Pt returned my call. I advised him that neuropathy was found in his NCV/EMG test with carpal tunnel, worse on the left. I advised pt that Dr. Brett Fairy wants to share these results with Dr. Fredna Dow. Pt says that he does not see Dr. Fredna Dow, he will be seeing Dr. Amedeo Plenty. Pt went to check the date of this appt, but the call was disconnected. I will call the pt back later.

## 2016-11-20 DIAGNOSIS — M25642 Stiffness of left hand, not elsewhere classified: Secondary | ICD-10-CM | POA: Diagnosis not present

## 2016-11-20 DIAGNOSIS — M79642 Pain in left hand: Secondary | ICD-10-CM | POA: Diagnosis not present

## 2016-11-20 DIAGNOSIS — E119 Type 2 diabetes mellitus without complications: Secondary | ICD-10-CM | POA: Diagnosis not present

## 2016-11-24 DIAGNOSIS — H353132 Nonexudative age-related macular degeneration, bilateral, intermediate dry stage: Secondary | ICD-10-CM | POA: Diagnosis not present

## 2016-11-24 DIAGNOSIS — E113393 Type 2 diabetes mellitus with moderate nonproliferative diabetic retinopathy without macular edema, bilateral: Secondary | ICD-10-CM | POA: Diagnosis not present

## 2016-11-25 DIAGNOSIS — M25642 Stiffness of left hand, not elsewhere classified: Secondary | ICD-10-CM | POA: Diagnosis not present

## 2016-12-02 DIAGNOSIS — M25642 Stiffness of left hand, not elsewhere classified: Secondary | ICD-10-CM | POA: Diagnosis not present

## 2016-12-07 DIAGNOSIS — M25642 Stiffness of left hand, not elsewhere classified: Secondary | ICD-10-CM | POA: Diagnosis not present

## 2016-12-09 DIAGNOSIS — M25642 Stiffness of left hand, not elsewhere classified: Secondary | ICD-10-CM | POA: Diagnosis not present

## 2016-12-14 DIAGNOSIS — M25642 Stiffness of left hand, not elsewhere classified: Secondary | ICD-10-CM | POA: Diagnosis not present

## 2016-12-16 DIAGNOSIS — M25642 Stiffness of left hand, not elsewhere classified: Secondary | ICD-10-CM | POA: Diagnosis not present

## 2016-12-21 DIAGNOSIS — M25642 Stiffness of left hand, not elsewhere classified: Secondary | ICD-10-CM | POA: Diagnosis not present

## 2016-12-21 DIAGNOSIS — M79642 Pain in left hand: Secondary | ICD-10-CM | POA: Diagnosis not present

## 2016-12-21 DIAGNOSIS — E1169 Type 2 diabetes mellitus with other specified complication: Secondary | ICD-10-CM | POA: Diagnosis not present

## 2016-12-21 DIAGNOSIS — G5603 Carpal tunnel syndrome, bilateral upper limbs: Secondary | ICD-10-CM | POA: Diagnosis not present

## 2016-12-22 DIAGNOSIS — M25642 Stiffness of left hand, not elsewhere classified: Secondary | ICD-10-CM | POA: Diagnosis not present

## 2017-01-11 DIAGNOSIS — R413 Other amnesia: Secondary | ICD-10-CM | POA: Diagnosis not present

## 2017-01-11 DIAGNOSIS — E784 Other hyperlipidemia: Secondary | ICD-10-CM | POA: Diagnosis not present

## 2017-01-11 DIAGNOSIS — E1129 Type 2 diabetes mellitus with other diabetic kidney complication: Secondary | ICD-10-CM | POA: Diagnosis not present

## 2017-01-11 DIAGNOSIS — Z794 Long term (current) use of insulin: Secondary | ICD-10-CM | POA: Diagnosis not present

## 2017-01-11 DIAGNOSIS — E162 Hypoglycemia, unspecified: Secondary | ICD-10-CM | POA: Diagnosis not present

## 2017-01-11 DIAGNOSIS — R2689 Other abnormalities of gait and mobility: Secondary | ICD-10-CM | POA: Diagnosis not present

## 2017-01-11 DIAGNOSIS — G4733 Obstructive sleep apnea (adult) (pediatric): Secondary | ICD-10-CM | POA: Diagnosis not present

## 2017-01-11 DIAGNOSIS — E038 Other specified hypothyroidism: Secondary | ICD-10-CM | POA: Diagnosis not present

## 2017-01-11 DIAGNOSIS — I129 Hypertensive chronic kidney disease with stage 1 through stage 4 chronic kidney disease, or unspecified chronic kidney disease: Secondary | ICD-10-CM | POA: Diagnosis not present

## 2017-01-11 DIAGNOSIS — Z6826 Body mass index (BMI) 26.0-26.9, adult: Secondary | ICD-10-CM | POA: Diagnosis not present

## 2017-01-11 DIAGNOSIS — N182 Chronic kidney disease, stage 2 (mild): Secondary | ICD-10-CM | POA: Diagnosis not present

## 2017-01-26 DIAGNOSIS — M25642 Stiffness of left hand, not elsewhere classified: Secondary | ICD-10-CM | POA: Diagnosis not present

## 2017-01-26 DIAGNOSIS — G5602 Carpal tunnel syndrome, left upper limb: Secondary | ICD-10-CM | POA: Diagnosis not present

## 2017-01-26 DIAGNOSIS — M65322 Trigger finger, left index finger: Secondary | ICD-10-CM | POA: Diagnosis not present

## 2017-01-26 DIAGNOSIS — M659 Synovitis and tenosynovitis, unspecified: Secondary | ICD-10-CM | POA: Diagnosis not present

## 2017-01-26 DIAGNOSIS — M24642 Ankylosis, left hand: Secondary | ICD-10-CM | POA: Diagnosis not present

## 2017-01-26 DIAGNOSIS — M65832 Other synovitis and tenosynovitis, left forearm: Secondary | ICD-10-CM | POA: Diagnosis not present

## 2017-01-26 DIAGNOSIS — M65842 Other synovitis and tenosynovitis, left hand: Secondary | ICD-10-CM | POA: Diagnosis not present

## 2017-02-02 DIAGNOSIS — M25642 Stiffness of left hand, not elsewhere classified: Secondary | ICD-10-CM | POA: Diagnosis not present

## 2017-02-05 DIAGNOSIS — M25642 Stiffness of left hand, not elsewhere classified: Secondary | ICD-10-CM | POA: Diagnosis not present

## 2017-02-10 DIAGNOSIS — G5603 Carpal tunnel syndrome, bilateral upper limbs: Secondary | ICD-10-CM | POA: Diagnosis not present

## 2017-02-10 DIAGNOSIS — M25642 Stiffness of left hand, not elsewhere classified: Secondary | ICD-10-CM | POA: Diagnosis not present

## 2017-02-10 DIAGNOSIS — M79642 Pain in left hand: Secondary | ICD-10-CM | POA: Diagnosis not present

## 2017-02-12 DIAGNOSIS — M25642 Stiffness of left hand, not elsewhere classified: Secondary | ICD-10-CM | POA: Diagnosis not present

## 2017-02-15 DIAGNOSIS — M25642 Stiffness of left hand, not elsewhere classified: Secondary | ICD-10-CM | POA: Diagnosis not present

## 2017-02-17 DIAGNOSIS — M25642 Stiffness of left hand, not elsewhere classified: Secondary | ICD-10-CM | POA: Diagnosis not present

## 2017-02-23 DIAGNOSIS — E1159 Type 2 diabetes mellitus with other circulatory complications: Secondary | ICD-10-CM | POA: Diagnosis not present

## 2017-02-23 DIAGNOSIS — L84 Corns and callosities: Secondary | ICD-10-CM | POA: Diagnosis not present

## 2017-02-23 DIAGNOSIS — B351 Tinea unguium: Secondary | ICD-10-CM | POA: Diagnosis not present

## 2017-02-24 DIAGNOSIS — M25642 Stiffness of left hand, not elsewhere classified: Secondary | ICD-10-CM | POA: Diagnosis not present

## 2017-02-26 DIAGNOSIS — M25642 Stiffness of left hand, not elsewhere classified: Secondary | ICD-10-CM | POA: Diagnosis not present

## 2017-03-03 DIAGNOSIS — M25642 Stiffness of left hand, not elsewhere classified: Secondary | ICD-10-CM | POA: Diagnosis not present

## 2017-03-05 DIAGNOSIS — M25642 Stiffness of left hand, not elsewhere classified: Secondary | ICD-10-CM | POA: Diagnosis not present

## 2017-03-08 DIAGNOSIS — M25642 Stiffness of left hand, not elsewhere classified: Secondary | ICD-10-CM | POA: Diagnosis not present

## 2017-03-10 DIAGNOSIS — M25642 Stiffness of left hand, not elsewhere classified: Secondary | ICD-10-CM | POA: Diagnosis not present

## 2017-03-15 DIAGNOSIS — M25642 Stiffness of left hand, not elsewhere classified: Secondary | ICD-10-CM | POA: Diagnosis not present

## 2017-03-19 DIAGNOSIS — M25642 Stiffness of left hand, not elsewhere classified: Secondary | ICD-10-CM | POA: Diagnosis not present

## 2017-03-24 DIAGNOSIS — M25642 Stiffness of left hand, not elsewhere classified: Secondary | ICD-10-CM | POA: Diagnosis not present

## 2017-03-31 DIAGNOSIS — Z23 Encounter for immunization: Secondary | ICD-10-CM | POA: Diagnosis not present

## 2017-03-31 DIAGNOSIS — M25642 Stiffness of left hand, not elsewhere classified: Secondary | ICD-10-CM | POA: Diagnosis not present

## 2017-04-04 ENCOUNTER — Encounter (HOSPITAL_COMMUNITY): Payer: Self-pay | Admitting: Emergency Medicine

## 2017-04-04 ENCOUNTER — Emergency Department (HOSPITAL_COMMUNITY)
Admission: EM | Admit: 2017-04-04 | Discharge: 2017-04-04 | Disposition: A | Discharge status: Home / Self Care | Payer: Medicare Other | Attending: Emergency Medicine | Admitting: Emergency Medicine

## 2017-04-04 ENCOUNTER — Emergency Department (HOSPITAL_COMMUNITY): Payer: Medicare Other

## 2017-04-04 DIAGNOSIS — R55 Syncope and collapse: Secondary | ICD-10-CM | POA: Diagnosis not present

## 2017-04-04 DIAGNOSIS — I10 Essential (primary) hypertension: Secondary | ICD-10-CM | POA: Diagnosis not present

## 2017-04-04 DIAGNOSIS — S0001XA Abrasion of scalp, initial encounter: Secondary | ICD-10-CM

## 2017-04-04 DIAGNOSIS — S069X9A Unspecified intracranial injury with loss of consciousness of unspecified duration, initial encounter: Secondary | ICD-10-CM | POA: Diagnosis not present

## 2017-04-04 DIAGNOSIS — Y998 Other external cause status: Secondary | ICD-10-CM | POA: Insufficient documentation

## 2017-04-04 DIAGNOSIS — E11649 Type 2 diabetes mellitus with hypoglycemia without coma: Secondary | ICD-10-CM | POA: Insufficient documentation

## 2017-04-04 DIAGNOSIS — Y92002 Bathroom of unspecified non-institutional (private) residence single-family (private) house as the place of occurrence of the external cause: Secondary | ICD-10-CM | POA: Insufficient documentation

## 2017-04-04 DIAGNOSIS — E162 Hypoglycemia, unspecified: Secondary | ICD-10-CM

## 2017-04-04 DIAGNOSIS — Y93E8 Activity, other personal hygiene: Secondary | ICD-10-CM | POA: Diagnosis not present

## 2017-04-04 DIAGNOSIS — Z794 Long term (current) use of insulin: Secondary | ICD-10-CM | POA: Diagnosis not present

## 2017-04-04 DIAGNOSIS — E876 Hypokalemia: Secondary | ICD-10-CM

## 2017-04-04 DIAGNOSIS — Z87891 Personal history of nicotine dependence: Secondary | ICD-10-CM | POA: Diagnosis not present

## 2017-04-04 DIAGNOSIS — S199XXA Unspecified injury of neck, initial encounter: Secondary | ICD-10-CM | POA: Diagnosis not present

## 2017-04-04 DIAGNOSIS — S0190XA Unspecified open wound of unspecified part of head, initial encounter: Secondary | ICD-10-CM | POA: Diagnosis not present

## 2017-04-04 DIAGNOSIS — W19XXXA Unspecified fall, initial encounter: Secondary | ICD-10-CM | POA: Insufficient documentation

## 2017-04-04 DIAGNOSIS — S0990XA Unspecified injury of head, initial encounter: Secondary | ICD-10-CM | POA: Diagnosis present

## 2017-04-04 LAB — CBG MONITORING, ED
GLUCOSE-CAPILLARY: 237 mg/dL — AB (ref 65–99)
Glucose-Capillary: 139 mg/dL — ABNORMAL HIGH (ref 65–99)
Glucose-Capillary: 46 mg/dL — ABNORMAL LOW (ref 65–99)
Glucose-Capillary: 71 mg/dL (ref 65–99)

## 2017-04-04 LAB — CBC WITH DIFFERENTIAL/PLATELET
BASOS ABS: 0 10*3/uL (ref 0.0–0.1)
BASOS PCT: 1 %
EOS ABS: 0.1 10*3/uL (ref 0.0–0.7)
Eosinophils Relative: 2 %
HCT: 37.9 % — ABNORMAL LOW (ref 39.0–52.0)
HEMOGLOBIN: 12.8 g/dL — AB (ref 13.0–17.0)
Lymphocytes Relative: 18 %
Lymphs Abs: 1 10*3/uL (ref 0.7–4.0)
MCH: 30.8 pg (ref 26.0–34.0)
MCHC: 33.8 g/dL (ref 30.0–36.0)
MCV: 91.1 fL (ref 78.0–100.0)
Monocytes Absolute: 0.4 10*3/uL (ref 0.1–1.0)
Monocytes Relative: 7 %
NEUTROS PCT: 72 %
Neutro Abs: 4.2 10*3/uL (ref 1.7–7.7)
Platelets: 156 10*3/uL (ref 150–400)
RBC: 4.16 MIL/uL — AB (ref 4.22–5.81)
RDW: 13.5 % (ref 11.5–15.5)
WBC: 5.8 10*3/uL (ref 4.0–10.5)

## 2017-04-04 LAB — COMPREHENSIVE METABOLIC PANEL
ALK PHOS: 79 U/L (ref 38–126)
ALT: 48 U/L (ref 17–63)
ANION GAP: 9 (ref 5–15)
AST: 82 U/L — ABNORMAL HIGH (ref 15–41)
Albumin: 3.4 g/dL — ABNORMAL LOW (ref 3.5–5.0)
BUN: 17 mg/dL (ref 6–20)
CALCIUM: 8.7 mg/dL — AB (ref 8.9–10.3)
CO2: 28 mmol/L (ref 22–32)
CREATININE: 1.05 mg/dL (ref 0.61–1.24)
Chloride: 103 mmol/L (ref 101–111)
Glucose, Bld: 144 mg/dL — ABNORMAL HIGH (ref 65–99)
Potassium: 3 mmol/L — ABNORMAL LOW (ref 3.5–5.1)
Sodium: 140 mmol/L (ref 135–145)
TOTAL PROTEIN: 6.2 g/dL — AB (ref 6.5–8.1)
Total Bilirubin: 0.6 mg/dL (ref 0.3–1.2)

## 2017-04-04 LAB — URINALYSIS, ROUTINE W REFLEX MICROSCOPIC
Bilirubin Urine: NEGATIVE
GLUCOSE, UA: 50 mg/dL — AB
HGB URINE DIPSTICK: NEGATIVE
KETONES UR: NEGATIVE mg/dL
Leukocytes, UA: NEGATIVE
Nitrite: NEGATIVE
PH: 8 (ref 5.0–8.0)
PROTEIN: NEGATIVE mg/dL
Specific Gravity, Urine: 1.014 (ref 1.005–1.030)

## 2017-04-04 LAB — TROPONIN I

## 2017-04-04 MED ORDER — SODIUM CHLORIDE 0.9 % IV SOLN
INTRAVENOUS | Status: DC
  Administered 2017-04-04: 12:00:00 via INTRAVENOUS

## 2017-04-04 MED ORDER — DEXTROSE 50 % IV SOLN
INTRAVENOUS | Status: AC
  Filled 2017-04-04: qty 50

## 2017-04-04 MED ORDER — POTASSIUM CHLORIDE CRYS ER 20 MEQ PO TBCR
40.0000 meq | EXTENDED_RELEASE_TABLET | Freq: Once | ORAL | Status: AC
  Administered 2017-04-04: 40 meq via ORAL
  Filled 2017-04-04: qty 2

## 2017-04-04 MED ORDER — DEXTROSE 50 % IV SOLN
50.0000 mL | Freq: Once | INTRAVENOUS | Status: AC
  Administered 2017-04-04: 50 mL via INTRAVENOUS

## 2017-04-04 MED ORDER — HOMATROPINE HBR 2 % OP SOLN
2.0000 [drp] | Freq: Once | OPHTHALMIC | Status: DC
  Filled 2017-04-04: qty 5

## 2017-04-04 MED ORDER — POTASSIUM CHLORIDE CRYS ER 20 MEQ PO TBCR: 20.0000 meq | EXTENDED_RELEASE_TABLET | Freq: Every day | ORAL | 0 refills | Status: DC

## 2017-04-04 MED ORDER — DEXTROSE 50 % IV SOLN: 50.0000 mL | Freq: Once | INTRAVENOUS | Status: DC

## 2017-04-04 NOTE — ED Notes (Signed)
Patient transported to CT 

## 2017-04-04 NOTE — ED Notes (Signed)
EKG given to EDP.  

## 2017-04-04 NOTE — ED Notes (Signed)
Pt provided with turkey sandwich.

## 2017-04-04 NOTE — Discharge Instructions (Addendum)
Try to eat and drink regularly.  We are providing a potassium prescription, since your potassium level was low.  Try to eat foods which contain more potassium, but be careful about adding more sugar.  Keep close track of your blood sugars and keep documenting the numbers, so you can present them to your PCP.  Ask your PCP about a possible need for referral to an endocrinologist for further treatment of your diabetes since you are having periods of both high and low blood sugar.

## 2017-04-04 NOTE — ED Triage Notes (Signed)
Arrived via EMS from [facility]. Lives with wife who heard a thud while patient in the bathroom.  EMS called found patient in supine position with abrasion posterior head. Assisted patient to a sitting position. Initial CBG 52. Given 25g of D10. While patient sitting syncope event for 30 seconds and EMS reported [employer] seizure for 30-60 seconds.  Post ictal for 30 seconds then Alert to normal.  Daughter at bedside states alert to his normal history of confusion. EMS reported patient ate breakfast and wife administered his insulin today.  Repeat CBG 178 then 115. Upon arrival to ED CBG 46 while Doctor and nurses at bedside. Ordered medication for patient.

## 2017-04-04 NOTE — ED Provider Notes (Signed)
[name] DEPT Provider Note   CSN: [id number] Arrival date & time: 04/04/17  1046     History   Chief Complaint Chief Complaint  Patient presents with  . Diabetes    HPI Derrick [name] is a 76 y.o. male.  The patient presents for evaluation of syncope, x2, with nonspecific shaking, and brief loss of consciousness.  Came by EMS.  Patient was in his bathroom shaving, when his wife heard him fall hitting floor, and injury to head.  No other preceding symptoms or recent illnesses.  He ate a full breakfast and took his usual medications this morning including insulin.  There are no other known modifying factors.  HPI  Past Medical History:  Diagnosis Date  . Cataract   . Complication of anesthesia    "high blood sugar; he's been loopy last 36h since OR"- 2016  . Diabetes mellitus   . DKA, type 2 ([place])   . Hearing loss   . Hypercholesteremia   . Hypertension   . Insulin pump in place   . Insulin pump in place   . Macular degeneration   . MCI (mild cognitive impairment) with memory loss 12/12/2014  . Obstructive sleep apnea   . OSA on CPAP 09/11/2014  . PONV (postoperative nausea and vomiting)   . Sleep apnea    cpap  . Uses hearing aid    right ear, patient stated he lost left hearing aid     Patient Active Problem List   Diagnosis Date Noted  . SOB (shortness of breath)   . Hypokalemia   . Diabetic ketoacidosis without coma associated with type 2 diabetes mellitus ([place]) 06/15/2016  . AKI (acute kidney injury) ([place]) 06/15/2016  . Diabetes mellitus ([place]) 10/29/2015  . HTN (hypertension) 10/29/2015  . S/P laparoscopic cholecystectomy 10/28/2015  . OSA on CPAP 09/11/2014    Past Surgical History:  Procedure Laterality Date  . CHOLECYSTECTOMY N/A 10/28/2015   Procedure: LAPAROSCOPIC CHOLECYSTECTOMY WITH   INTRAOPERATIVE CHOLANGIOGRAM;  Surgeon: Greer Pickerel, MD;  Location: [facility] ORS;  Service: General;  Laterality: N/A;  . SHOULDER ARTHROSCOPY Right 08/24/11   "frozen shoulder; RCR w/labrum tear; arthritis scraping; bone spurs"  . SHOULDER ARTHROSCOPY Left ~ 2006  . TONSILLECTOMY  1949   "in childhood"       Home Medications    Prior to Admission medications   Medication Sig Start Date End Date Taking? Authorizing Provider  amLODipine (NORVASC) 10 MG tablet Take 10 mg by mouth every evening.    Yes [provider]  aspirin EC 81 MG tablet Take 81 mg by mouth daily.   Yes [provider]  donepezil (ARICEPT) 10 MG tablet Take 10 mg by mouth at bedtime.   Yes [provider]  Insulin Glargine (BASAGLAR KWIKPEN) 100 UNIT/ML SOPN Inject 27 Units into the skin daily.   Yes [provider]  labetalol (NORMODYNE) 200 MG tablet Take 200 mg by mouth 2 (two) times daily. 07/08/15  Yes [provider]  levothyroxine (SYNTHROID, LEVOTHROID) 25 MCG tablet Take 25 mcg by mouth daily before breakfast.   Yes [provider]  lisinopril-hydrochlorothiazide (PRINZIDE,ZESTORETIC) 20-12.5 MG per tablet Take 1 tablet by mouth 2 (two) times daily.    Yes [provider]  Multiple Vitamin (MULTIVITAMIN WITH MINERALS) TABS tablet Take 1 tablet by mouth daily.   Yes [provider]  Multiple Vitamins-Minerals (PRESERVISION AREDS 2 PO) Take 1 capsule by mouth 2 (two) times daily.   Yes [provider]  rosuvastatin (CRESTOR) 20 MG tablet Take 20 mg by mouth daily.   Yes [provider]  potassium chloride SA (K-DUR,KLOR-CON) 20 MEQ tablet Take 1 tablet (20 mEq total) by mouth daily. 04/04/17   Daleen Bo, MD    Family History Family History  Problem Relation Age of Onset  . Heart disease Father   . High blood pressure Father   . Colon cancer Neg Hx     Social History Social History  Substance Use Topics  . Smoking status: Former Smoker    Packs/day: 0.25    Years: 15.00    Types: Cigarettes    Quit date: 06/23/1983  . Smokeless tobacco: Never Used  . Alcohol use No       Allergies   Patient has no known allergies.   Review of Systems Review of Systems  All other systems reviewed and are negative.    Physical Exam Updated Vital Signs BP 132/63   Pulse 80   Temp (!) 97.5 F (36.4 C)   Resp 20   Ht 5\' 8"  (1.727 m)   Wt 72.6 kg (160 lb)   SpO2 96%   BMI 24.33 kg/m   Physical Exam  Constitutional: He is oriented to person, place, and time. He appears well-developed. No distress.  Elderly, frail  HENT:  Head: Normocephalic.  Right Ear: External ear normal.  Left Ear: External ear normal.  Contusion with nonbleeding abrasion, mid parietal region.  Eyes: Pupils are equal, round, and reactive to light. Conjunctivae and EOM are normal.  Neck: Normal range of motion and phonation normal. Neck supple.  Cardiovascular: Normal rate, regular rhythm and normal heart sounds.   Pulmonary/Chest: Effort normal and breath sounds normal. He exhibits no bony tenderness.  Abdominal: Soft. There is no tenderness.  Musculoskeletal: Normal range of motion. He exhibits no tenderness or deformity.  Neurological: He is alert and oriented to person, place, and time. No cranial nerve deficit or sensory deficit. He exhibits normal muscle tone. Coordination normal.  Skin: Skin is warm, dry and intact. No rash noted.  Psychiatric: He has a normal mood and affect. His behavior is normal.  Nursing note and vitals reviewed.    ED Treatments / Results  Labs (all labs ordered are listed, but only abnormal results are displayed) Labs Reviewed  COMPREHENSIVE METABOLIC PANEL - Abnormal; Notable for the following:       Result Value   Potassium 3.0 (*)    Glucose, Bld 144 (*)    Calcium 8.7 (*)    Total Protein 6.2 (*)    Albumin 3.4 (*)    AST 82 (*)    All other components within normal limits  CBC WITH DIFFERENTIAL/PLATELET - Abnormal; Notable for the following:    RBC 4.16 (*)    Hemoglobin 12.8 (*)    HCT 37.9 (*)    All other components within normal  limits  URINALYSIS, ROUTINE W REFLEX MICROSCOPIC - Abnormal; Notable for the following:    Glucose, UA 50 (*)    All other components within normal limits  CBG MONITORING, ED - Abnormal; Notable for the following:    Glucose-Capillary 46 (*)    All other components within normal limits  CBG MONITORING, ED - Abnormal; Notable for the following:    Glucose-Capillary 139 (*)    All other components within normal limits  CBG MONITORING, ED - Abnormal; Notable for the following:    Glucose-Capillary 237 (*)    All other components within normal limits  TROPONIN I  CBG MONITORING, ED    EKG  EKG Interpretation  Date/Time:  Sunday April 04 2017 11:26:03 EDT Ventricular Rate:  59 PR Interval:    QRS Duration: 111 QT Interval:  453 QTC Calculation: 449 R Axis:   -38 Text Interpretation:  Sinus rhythm Prolonged PR interval Left axis deviation Anteroseptal infarct, old Nonspecific T abnormalities, inferior leads since last tracing no significant change Confirmed by Daleen Bo [phone]) on 04/04/2017 5:45:58 PM       Radiology Ct Head Wo Contrast  Result Date: 04/04/2017 CLINICAL DATA:  Head laceration after fall today. Positive loss of consciousness. EXAM: CT HEAD WITHOUT CONTRAST CT CERVICAL SPINE WITHOUT CONTRAST TECHNIQUE: Multidetector CT imaging of the head and cervical spine was performed following the standard protocol without intravenous contrast. Multiplanar CT image reconstructions of the cervical spine were also generated. COMPARISON:  None. FINDINGS: CT HEAD FINDINGS Brain: Mild chronic ischemic white matter disease is noted. Old right periventricular white matter infarction is noted. No mass effect or midline shift is noted. Ventricular size is within normal limits. There is no evidence of mass lesion, hemorrhage or acute infarction. Probable old lacunar infarction is noted in left basal ganglia. Vascular: No hyperdense vessel or unexpected calcification. Skull: Normal.  Negative for fracture or focal lesion. Sinuses/Orbits: No acute finding. Other: None. CT CERVICAL SPINE FINDINGS Alignment: Normal. Skull base and vertebrae: No acute fracture. No primary bone lesion or focal pathologic process. Soft tissues and spinal canal: No prevertebral fluid or swelling. No visible canal hematoma. Disc levels: Severe degenerative disc disease is noted at C5-6 and C6-7. Upper chest: Negative. Other: None. IMPRESSION: Mild chronic ischemic white matter disease. No acute intracranial abnormality seen. Severe multilevel degenerative disc disease. No acute abnormality seen in the cervical spine. Electronically Signed   By: Marijo Conception, M.D.   On: 04/04/2017 12:29   Ct Cervical Spine Wo Contrast  Result Date: 04/04/2017 CLINICAL DATA:  Head laceration after fall today. Positive loss of consciousness. EXAM: CT HEAD WITHOUT CONTRAST CT CERVICAL SPINE WITHOUT CONTRAST TECHNIQUE: Multidetector CT imaging of the head and cervical spine was performed following the standard protocol without intravenous contrast. Multiplanar CT image reconstructions of the cervical spine were also generated. COMPARISON:  None. FINDINGS: CT HEAD FINDINGS Brain: Mild chronic ischemic white matter disease is noted. Old right periventricular white matter infarction is noted. No mass effect or midline shift is noted. Ventricular size is within normal limits. There is no evidence of mass lesion, hemorrhage or acute infarction. Probable old lacunar infarction is noted in left basal ganglia. Vascular: No hyperdense vessel or unexpected calcification. Skull: Normal. Negative for fracture or focal lesion. Sinuses/Orbits: No acute finding. Other: None. CT CERVICAL SPINE FINDINGS Alignment: Normal. Skull base and vertebrae: No acute fracture. No primary bone lesion or focal pathologic process. Soft tissues and spinal canal: No prevertebral fluid or swelling. No visible canal hematoma. Disc levels: Severe degenerative disc  disease is noted at C5-6 and C6-7. Upper chest: Negative. Other: None. IMPRESSION: Mild chronic ischemic white matter disease. No acute intracranial abnormality seen. Severe multilevel degenerative disc disease. No acute abnormality seen in the cervical spine. Electronically Signed   By: Marijo Conception, M.D.   On: 04/04/2017 12:29    Procedures .Critical Care Performed by: Daleen Bo Authorized by: Daleen Bo   Critical care provider statement:    Critical care time (minutes):  50   Critical care start time:  04/04/2017 11:10 AM   Critical care end  time:  04/04/2017 2:42 PM   Critical care time was exclusive of:  Separately billable procedures and treating other patients   Critical care was necessary to treat or prevent imminent or life-threatening deterioration of the following conditions:  Metabolic crisis   Critical care was time spent personally by me on the following activities:  Development of treatment plan with patient or surrogate, evaluation of patient's response to treatment, examination of patient, obtaining history from patient or surrogate, ordering and performing treatments and interventions, ordering and review of laboratory studies, ordering and review of radiographic studies, pulse oximetry, re-evaluation of patient's condition and review of old charts      (including critical care time)  Medications Ordered in ED Medications  dextrose 50 % solution 50 mL (50 mLs Intravenous Given 04/04/17 1115)  potassium chloride SA (K-DUR,KLOR-CON) CR tablet 40 mEq (40 mEq Oral Given 04/04/17 1422)     Initial Impression / Assessment and Plan / ED Course  I have reviewed the triage vital signs and the nursing notes.  Pertinent labs & imaging results that were available during my care of the patient were reviewed by me and considered in my medical decision making (see chart for details).  Clinical Course as of Apr 05 1121  Sun Apr 04, 2017  1113 Patient able to  communicate, give history, somewhat poor historian.  Daughter is with him now and helping to give history.  CBG currently 46, D50 ordered  [EW]    Clinical Course User Index [EW] Daleen Bo, MD     Patient Vitals for the past 24 hrs:  BP Pulse Resp SpO2  04/04/17 1500 132/63 80 20 96 %  04/04/17 1430 136/65 80 (!) 23 94 %  04/04/17 1415 - 72 17 95 %  04/04/17 1400 (!) 127/58 76 17 97 %  04/04/17 1330 130/66 67 (!) 21 95 %  04/04/17 1300 128/66 68 20 97 %  04/04/17 1230 (!) 144/63 65 (!) 21 98 %  04/04/17 1200 140/61 62 19 97 %  04/04/17 1130 131/61 (!) 58 19 99 %    2:40 PM Reevaluation with update and discussion. After initial assessment and treatment, an updated evaluation reveals he remains alert and comfortable has had no events, or other concerns at this time.  He has been able to drink and eat and is comfortable.  Findings discussed with the patient and his wife.  His wife showed me his log of blood sugars and he has had wide ranging variable blood sugars, at various times of the day, without pattern.  He has had some early morning 60-70 range, and a couple during the day as well.  He has had numerous values greater than 300, and is using supplement NovoLog, at meals when sugars are over 300.  For that he takes 5 mg of NovoLog.  Otherwise he is on his daily a.m insulin at basal rate 27 units.  There is been no recent change in his dosing.  There is no evident change in his oral intake.  All questions answered. Homer Miller L     Final Clinical Impressions(s) / ED Diagnoses   Final diagnoses:  Syncope, unspecified syncope type  Hypoglycemia  Injury of head, initial encounter  Abrasion of scalp, initial encounter  Hypokalemia   Syncope, with hypoglycemia.  Fall with head contusion, likely multifactorial etiology.  Patient has widely varying blood sugars, despite current treatment.  Incidental mild hypokalemia, likely related to use of diuretic medications as well as  nutritional issues.  Patient appears to require reassessment of his dosage need for diabetes.  No acute intracranial injury, or unstable cardiovascular status.  Patient can be managed as an outpatient, with close follow-up utilizing the services of his PCP.  He may benefit from endocrinology evaluation.  Nursing Notes Reviewed/ Care Coordinated Applicable Imaging Reviewed Interpretation of Laboratory Data incorporated into ED treatment  The patient appears reasonably screened and/or stabilized for discharge and I doubt any other medical condition or other [facility] requiring further screening, evaluation, or treatment in the ED at this time prior to discharge.  Plan: Home Medications-continue current treatment; Home Treatments-rest, follow blood sugars closely, watch for progressive symptoms; return here if the recommended treatment, does not improve the symptoms; Recommended follow up-PCP checkup 1 week and as needed.  Consider endocrinology follow-up.    New Prescriptions Discharge Medication List as of 04/04/2017  3:18 PM    START taking these medications   Details  potassium chloride SA (K-DUR,KLOR-CON) 20 MEQ tablet Take 1 tablet (20 mEq total) by mouth daily., Starting Sun 04/04/2017, Print         Daleen Bo, MD 04/05/17 1125

## 2017-04-05 DIAGNOSIS — R55 Syncope and collapse: Secondary | ICD-10-CM | POA: Diagnosis not present

## 2017-04-05 DIAGNOSIS — E1129 Type 2 diabetes mellitus with other diabetic kidney complication: Secondary | ICD-10-CM | POA: Diagnosis not present

## 2017-04-05 DIAGNOSIS — Z6826 Body mass index (BMI) 26.0-26.9, adult: Secondary | ICD-10-CM | POA: Diagnosis not present

## 2017-04-07 DIAGNOSIS — H353121 Nonexudative age-related macular degeneration, left eye, early dry stage: Secondary | ICD-10-CM | POA: Diagnosis not present

## 2017-04-07 DIAGNOSIS — E113393 Type 2 diabetes mellitus with moderate nonproliferative diabetic retinopathy without macular edema, bilateral: Secondary | ICD-10-CM | POA: Diagnosis not present

## 2017-04-07 DIAGNOSIS — H353212 Exudative age-related macular degeneration, right eye, with inactive choroidal neovascularization: Secondary | ICD-10-CM | POA: Diagnosis not present

## 2017-04-07 DIAGNOSIS — H43813 Vitreous degeneration, bilateral: Secondary | ICD-10-CM | POA: Diagnosis not present

## 2017-04-22 DIAGNOSIS — M79642 Pain in left hand: Secondary | ICD-10-CM | POA: Diagnosis not present

## 2017-04-22 DIAGNOSIS — G5603 Carpal tunnel syndrome, bilateral upper limbs: Secondary | ICD-10-CM | POA: Diagnosis not present

## 2017-04-26 DIAGNOSIS — E1129 Type 2 diabetes mellitus with other diabetic kidney complication: Secondary | ICD-10-CM | POA: Diagnosis not present

## 2017-04-26 DIAGNOSIS — Z6826 Body mass index (BMI) 26.0-26.9, adult: Secondary | ICD-10-CM | POA: Diagnosis not present

## 2017-05-04 DIAGNOSIS — M25642 Stiffness of left hand, not elsewhere classified: Secondary | ICD-10-CM | POA: Diagnosis not present

## 2017-05-11 DIAGNOSIS — M25642 Stiffness of left hand, not elsewhere classified: Secondary | ICD-10-CM | POA: Diagnosis not present

## 2017-05-19 DIAGNOSIS — Z6827 Body mass index (BMI) 27.0-27.9, adult: Secondary | ICD-10-CM | POA: Diagnosis not present

## 2017-05-19 DIAGNOSIS — I129 Hypertensive chronic kidney disease with stage 1 through stage 4 chronic kidney disease, or unspecified chronic kidney disease: Secondary | ICD-10-CM | POA: Diagnosis not present

## 2017-05-19 DIAGNOSIS — N182 Chronic kidney disease, stage 2 (mild): Secondary | ICD-10-CM | POA: Diagnosis not present

## 2017-05-19 DIAGNOSIS — R5381 Other malaise: Secondary | ICD-10-CM | POA: Diagnosis not present

## 2017-05-19 DIAGNOSIS — E038 Other specified hypothyroidism: Secondary | ICD-10-CM | POA: Diagnosis not present

## 2017-05-19 DIAGNOSIS — R2689 Other abnormalities of gait and mobility: Secondary | ICD-10-CM | POA: Diagnosis not present

## 2017-05-19 DIAGNOSIS — R413 Other amnesia: Secondary | ICD-10-CM | POA: Diagnosis not present

## 2017-05-19 DIAGNOSIS — E1129 Type 2 diabetes mellitus with other diabetic kidney complication: Secondary | ICD-10-CM | POA: Diagnosis not present

## 2017-05-19 DIAGNOSIS — E162 Hypoglycemia, unspecified: Secondary | ICD-10-CM | POA: Diagnosis not present

## 2017-05-19 DIAGNOSIS — E663 Overweight: Secondary | ICD-10-CM | POA: Diagnosis not present

## 2017-05-19 DIAGNOSIS — Z794 Long term (current) use of insulin: Secondary | ICD-10-CM | POA: Diagnosis not present

## 2017-05-19 DIAGNOSIS — E7849 Other hyperlipidemia: Secondary | ICD-10-CM | POA: Diagnosis not present

## 2017-05-26 DIAGNOSIS — M25642 Stiffness of left hand, not elsewhere classified: Secondary | ICD-10-CM | POA: Diagnosis not present

## 2017-07-30 DIAGNOSIS — E1159 Type 2 diabetes mellitus with other circulatory complications: Secondary | ICD-10-CM | POA: Diagnosis not present

## 2017-07-30 DIAGNOSIS — L84 Corns and callosities: Secondary | ICD-10-CM | POA: Diagnosis not present

## 2017-07-30 DIAGNOSIS — B351 Tinea unguium: Secondary | ICD-10-CM | POA: Diagnosis not present

## 2017-08-05 DIAGNOSIS — E113393 Type 2 diabetes mellitus with moderate nonproliferative diabetic retinopathy without macular edema, bilateral: Secondary | ICD-10-CM | POA: Diagnosis not present

## 2017-08-05 DIAGNOSIS — H43813 Vitreous degeneration, bilateral: Secondary | ICD-10-CM | POA: Diagnosis not present

## 2017-08-05 DIAGNOSIS — H353121 Nonexudative age-related macular degeneration, left eye, early dry stage: Secondary | ICD-10-CM | POA: Diagnosis not present

## 2017-08-05 DIAGNOSIS — H353212 Exudative age-related macular degeneration, right eye, with inactive choroidal neovascularization: Secondary | ICD-10-CM | POA: Diagnosis not present

## 2017-08-06 DIAGNOSIS — E161 Other hypoglycemia: Secondary | ICD-10-CM | POA: Diagnosis not present

## 2017-08-06 DIAGNOSIS — E162 Hypoglycemia, unspecified: Secondary | ICD-10-CM | POA: Diagnosis not present

## 2017-10-01 DIAGNOSIS — E113393 Type 2 diabetes mellitus with moderate nonproliferative diabetic retinopathy without macular edema, bilateral: Secondary | ICD-10-CM | POA: Diagnosis not present

## 2017-10-01 DIAGNOSIS — H5213 Myopia, bilateral: Secondary | ICD-10-CM | POA: Diagnosis not present

## 2017-10-01 DIAGNOSIS — H2512 Age-related nuclear cataract, left eye: Secondary | ICD-10-CM | POA: Diagnosis not present

## 2017-10-04 ENCOUNTER — Telehealth: Payer: Self-pay | Admitting: Neurology

## 2017-10-04 ENCOUNTER — Other Ambulatory Visit: Payer: Self-pay | Admitting: Neurology

## 2017-10-04 ENCOUNTER — Encounter: Payer: Self-pay | Admitting: Adult Health

## 2017-10-04 DIAGNOSIS — G4733 Obstructive sleep apnea (adult) (pediatric): Secondary | ICD-10-CM

## 2017-10-04 DIAGNOSIS — Z9989 Dependence on other enabling machines and devices: Principal | ICD-10-CM

## 2017-10-04 NOTE — Telephone Encounter (Signed)
Called the pt's wife back and she states that [facility] has informed them they would need to contact us and make an apt in order for the pt to receive supplies. I have informed her that he was seen within the year. I have made him an apt for July and will also place on the wait list for the pt to be seen sooner but since the patient has been seen within the year then I feel like his order should be ok. I placed the order for the pt and I will send to [facility] to attempt and see if they can help with the patient. I have also given the wife the number to [facility] for her to call.

## 2017-10-04 NOTE — Telephone Encounter (Signed)
Pt wife(on DPR) has called asking for a call back to discuss the idea of pt possibly having to have another sleep study since he has not been seen for CPAP since 2016.  Pt is asking if she can be called between 1-3 on house phone

## 2017-10-05 ENCOUNTER — Encounter: Payer: Self-pay | Admitting: Adult Health

## 2017-10-05 ENCOUNTER — Ambulatory Visit (INDEPENDENT_AMBULATORY_CARE_PROVIDER_SITE_OTHER): Payer: Medicare Other | Admitting: Adult Health

## 2017-10-05 ENCOUNTER — Telehealth: Payer: Self-pay | Admitting: Neurology

## 2017-10-05 DIAGNOSIS — Z9989 Dependence on other enabling machines and devices: Secondary | ICD-10-CM | POA: Diagnosis not present

## 2017-10-05 DIAGNOSIS — G4733 Obstructive sleep apnea (adult) (pediatric): Secondary | ICD-10-CM

## 2017-10-05 NOTE — Patient Instructions (Signed)
Your Plan:  Continue using CPAP nightly If your symptoms worsen or you develop new symptoms please let us know.   Thank you for coming to see us at [facility]. I hope we have been able to provide you high quality care today.  You may receive a patient satisfaction survey over the next few weeks. We would appreciate your feedback and comments so that we may continue to improve ourselves and the health of our patients.  

## 2017-10-05 NOTE — Telephone Encounter (Signed)
Called to offer an apt today at 2 pm with Ward Givens to get the patient in sooner because [facility] was saying they needed to be seen in order to get supplies.  Yesterday they had called and I had scheduled an apt for July with [facility]ol[facility], pt already had a yearly follow up with [facility] in may that I overlooked. I LVM with this information and we could cancel the apt In July but just wanted to inform the patient of this and still offer the 2 pm slot today if they call back

## 2017-10-05 NOTE — Progress Notes (Signed)
PATIENT: GERALDINE TESAR DOB: 06-25-1940  REASON FOR VISIT: follow up HISTORY FROM: patient  HISTORY OF PRESENT ILLNESS: Today 10/05/17:  Mr. Babe is a 77 year old male with a history of present.  He returns today for a follow-up.  His sleep download indicates that he use his machine nightly for compliance of 100%.  He uses machine greater than 4 hours each night.  On average he uses his machine 9 hours and 26 minutes.  His residual AHI is 2.2 on a minimum pressure of 5 cm of water and maximum pressure of 15 cm of water with EPR of 3.  He does not have a significant leak.  Overall the patient states that he is doing well.  He would like to change his DME company.  He returns today for evaluation.  HISTORY 11/04/16: (copied from Dr. Edwena Felty notes) Mr. Eskelson presents today after 2 hospitalizations for diabetes ketoacidosis. Last May at this time he was in hospital for a cholecystectomy. He has had no trouble with his abdominal function since. However his diabetes has been difficult to control at times. He has lost 30 pounds since, he resides with his wife at [facility] [employer] [facility] and keeps his diet by controlling his overall portion size but also the choice of food he makes. He has 100% compliance for his AutoSet CPAP which covers a range between 5 and 15 cm water pressure with an EPR level of 3 cm water, AHI is 2.4 excellent resolution. Also my patient lost a significant amount of weight is 91st percentile pressure is still covered by the current settings and is now at 9.3 cm water. He has only a few central apneas and I did do not see a reason to restrict his window of pressure. He reports hand pain, likely carpal tunnel syndrome.   REVIEW OF SYSTEMS: Out of a complete 14 system review of symptoms, the patient complains only of the following symptoms, and all other reviewed systems are negative.  Fatigue severity score 22 ESS 7   ALLERGIES: No Known Allergies  HOME  MEDICATIONS: Outpatient Medications Prior to Visit  Medication Sig Dispense Refill  . amLODipine (NORVASC) 10 MG tablet Take 10 mg by mouth every evening.     Marland Kitchen aspirin EC 81 MG tablet Take 81 mg by mouth daily.    Marland Kitchen donepezil (ARICEPT) 10 MG tablet Take 10 mg by mouth at bedtime.    . Insulin Glargine (BASAGLAR KWIKPEN) 100 UNIT/ML SOPN Inject 28 Units into the skin daily.     Marland Kitchen labetalol (NORMODYNE) 200 MG tablet Take 200 mg by mouth 2 (two) times daily.  7  . levothyroxine (SYNTHROID, LEVOTHROID) 25 MCG tablet Take 25 mcg by mouth daily before breakfast.    . lisinopril-hydrochlorothiazide (PRINZIDE,ZESTORETIC) 20-12.5 MG per tablet Take 1 tablet by mouth 2 (two) times daily.     . Multiple Vitamin (MULTIVITAMIN WITH MINERALS) TABS tablet Take 1 tablet by mouth daily.    . Multiple Vitamins-Minerals (PRESERVISION AREDS 2 PO) Take 1 capsule by mouth 2 (two) times daily.    . rosuvastatin (CRESTOR) 20 MG tablet Take 20 mg by mouth daily.    . potassium chloride SA (K-DUR,KLOR-CON) 20 MEQ tablet Take 1 tablet (20 mEq total) by mouth daily. 3 tablet 0   No facility-administered medications prior to visit.     PAST MEDICAL HISTORY: Past Medical History:  Diagnosis Date  . Cataract   . Complication of anesthesia    "high blood sugar; he's  been loopy last 36h since OR"- 2016  . Diabetes mellitus   . DKA, type 2 ([place])   . Hearing loss   . Hypercholesteremia   . Hypertension   . Insulin pump in place   . Insulin pump in place   . Macular degeneration   . MCI (mild cognitive impairment) with memory loss 12/12/2014  . Obstructive sleep apnea   . OSA on CPAP 09/11/2014  . PONV (postoperative nausea and vomiting)   . Sleep apnea    cpap  . Uses hearing aid    right ear, patient stated he lost left hearing aid     PAST SURGICAL HISTORY: Past Surgical History:  Procedure Laterality Date  . CHOLECYSTECTOMY N/A 10/28/2015   Procedure: LAPAROSCOPIC CHOLECYSTECTOMY WITH   INTRAOPERATIVE  CHOLANGIOGRAM;  Surgeon: Greer Pickerel, MD;  Location: [facility]S;  Service: General;  Laterality: N/A;  . SHOULDER ARTHROSCOPY Right 08/24/11   "frozen shoulder; RCR w/labrum tear; arthritis scraping; bone spurs"  . SHOULDER ARTHROSCOPY Left ~ 2006  . TONSILLECTOMY  1949   "in childhood"    FAMILY HISTORY: Family History  Problem Relation Age of Onset  . Heart disease Father   . High blood pressure Father   . Colon cancer Neg Hx     SOCIAL HISTORY: Social History   Socioeconomic History  . Marital status: Married    Spouse name: [name]   . Number of children: 2  . Years of education: masters  . Highest education level: Not on file  Occupational History    Comment: Retired.  Social Needs  . Financial resource strain: Not on file  . Food insecurity:    Worry: Not on file    Inability: Not on file  . Transportation needs:    Medical: Not on file    Non-medical: Not on file  Tobacco Use  . Smoking status: Former Smoker    Packs/day: 0.25    Years: 15.00    Pack years: 3.75    Types: Cigarettes    Last attempt to quit: 06/23/1983    Years since quitting: 34.3  . Smokeless tobacco: Never Used  Substance and Sexual Activity  . Alcohol use: No    Alcohol/week: 0.0 oz  . Drug use: No  . Sexual activity: Yes  Lifestyle  . Physical activity:    Days per week: Not on file    Minutes per session: Not on file  . Stress: Not on file  Relationships  . Social connections:    Talks on phone: Not on file    Gets together: Not on file    Attends religious service: Not on file    Active member of club or organization: Not on file    Attends meetings of clubs or organizations: Not on file    Relationship status: Not on file  . Intimate partner violence:    Fear of current or ex partner: Not on file    Emotionally abused: Not on file    Physically abused: Not on file    Forced sexual activity: Not on file  Other Topics Concern  . Not on file  Social History Narrative   Patient  lives at home with his wife [name]).   Retired.   Education Master's degree.   Right handed.   Caffeine three cups of coffee daily.      PHYSICAL EXAM  There were no vitals filed for this visit. There is no height or weight on file to calculate BMI.  Generalized: Well developed, in no acute distress   Neurological examination  Mentation: Alert oriented to time, place, history taking. Follows all commands speech and language fluent Cranial nerve II-XII: Pupils were equal round reactive to light. Extraocular movements were full, visual field were full on confrontational test. Facial sensation and strength were normal. Uvula tongue midline. Head turning and shoulder shrug  were normal and symmetric.  Neck circumference 16-1/2 inches, Mallampati 3+  Motor: The motor testing reveals 5 over 5 strength of all 4 extremities. Good symmetric motor tone is noted throughout.  Sensory: Sensory testing is intact to soft touch on all 4 extremities. No evidence of extinction is noted.  Coordination: Cerebellar testing reveals good finger-nose-finger and heel-to-shin bilaterally.  Gait and station: Gait is normal. Reflexes: Deep tendon reflexes are symmetric and normal bilaterally.   DIAGNOSTIC DATA (LABS, IMAGING, TESTING) - I reviewed patient records, labs, notes, testing and imaging myself where available.  Lab Results  Component Value Date   WBC 5.8 04/04/2017   HGB 12.8 (L) 04/04/2017   HCT 37.9 (L) 04/04/2017   MCV 91.1 04/04/2017   PLT 156 04/04/2017      Component Value Date/Time   NA 140 04/04/2017 1123   K 3.0 (L) 04/04/2017 1123   CL 103 04/04/2017 1123   CO2 28 04/04/2017 1123   GLUCOSE 144 (H) 04/04/2017 1123   BUN 17 04/04/2017 1123   CREATININE 1.05 04/04/2017 1123   CALCIUM 8.7 (L) 04/04/2017 1123   PROT 6.2 (L) 04/04/2017 1123   ALBUMIN 3.4 (L) 04/04/2017 1123   AST 82 (H) 04/04/2017 1123   ALT 48 04/04/2017 1123   ALKPHOS 79 04/04/2017 1123   BILITOT 0.6  04/04/2017 1123   GFRNONAA >60 04/04/2017 1123   GFRAA >60 04/04/2017 1123      ASSESSMENT AND PLAN 77 y.o. year old male  has a past medical history of Cataract, Complication of anesthesia, Diabetes mellitus, DKA, type 2 ([place]), Hearing loss, Hypercholesteremia, Hypertension, Insulin pump in place, Insulin pump in place, Macular degeneration, MCI (mild cognitive impairment) with memory loss (12/12/2014), Obstructive sleep apnea, OSA on CPAP (09/11/2014), PONV (postoperative nausea and vomiting), Sleep apnea, and Uses hearing aid. here with:  1.  Obstructive sleep apnea on CPAP  The patient CPAP download indicates a compliance and good treatment of his apnea.  He would like to switch to another DME company.  That reason we will have to repeat a home sleep test.  This will be ordered today.  Patient is advised that if his symptoms worsen or he develops new symptoms he should let us know.  He will follow-up after his home sleep test or sooner if needed.    Ward Givens, MSN, NP-C 10/05/2017, 1:42 PM [facility] [address], [place], [place] [address] [phone]   I spent 15 minutes with the patient. 50% of this time was spent reviewing CPAP, and requirements for new DME

## 2017-10-05 NOTE — Telephone Encounter (Signed)
I scheduled patient today at 2pm check in at 1:30pm with Megan per previous message. I cancelled appointment in July with Hoyle Sauer.

## 2017-10-14 ENCOUNTER — Telehealth: Payer: Self-pay | Admitting: Adult Health

## 2017-10-14 DIAGNOSIS — G4733 Obstructive sleep apnea (adult) (pediatric): Secondary | ICD-10-CM

## 2017-10-14 DIAGNOSIS — Z9989 Dependence on other enabling machines and devices: Principal | ICD-10-CM

## 2017-10-14 NOTE — Telephone Encounter (Signed)
This order was placed on 10/05/17- listed under procedure tab.

## 2017-10-14 NOTE — Telephone Encounter (Signed)
Yes I do have the order. The pt was called and left a voicemail on 4/23 to call us back to get scheduled for hst

## 2017-10-14 NOTE — Telephone Encounter (Signed)
Pt's wife called states after the appt last they rec'd a call from [facility]. She said at the last OV it was discussed about using another DME because he was dissatisfied with [facility]. Please call to clarify where DME order was sent.

## 2017-10-14 NOTE — Telephone Encounter (Signed)
LVM advising patient that he must have another home sleep study before being sent to a new DME company for his CPAP. Advised him the order will be placed, and he will get a call in a few days to schedule sleep study. Left number for any questions.

## 2017-10-18 NOTE — Telephone Encounter (Signed)
Sheena were you able to reach the patient.

## 2017-10-19 NOTE — Telephone Encounter (Signed)
Yes we did get in contact with the pt his is now scheduled for his hst

## 2017-10-25 ENCOUNTER — Ambulatory Visit (INDEPENDENT_AMBULATORY_CARE_PROVIDER_SITE_OTHER): Payer: Medicare Other | Admitting: Neurology

## 2017-10-25 DIAGNOSIS — G4733 Obstructive sleep apnea (adult) (pediatric): Secondary | ICD-10-CM | POA: Diagnosis not present

## 2017-10-25 DIAGNOSIS — Z9989 Dependence on other enabling machines and devices: Principal | ICD-10-CM

## 2017-10-29 ENCOUNTER — Telehealth: Payer: Self-pay | Admitting: Neurology

## 2017-10-29 ENCOUNTER — Encounter: Payer: Self-pay | Admitting: Neurology

## 2017-10-29 NOTE — Telephone Encounter (Signed)
[facility] Sleep @[facility] [address], [place] [address] NAME:  Derrick [name]                                                                DOB: 11/13/1940 MEDICAL RECORD NUMBER [id number]                                                         DOS: 10/25/2017  REFERRING PHYSICIAN: Burnard Bunting, M.D. STUDY PERFORMED: Home Sleep Study on Apnea Link HISTORY: [name]. Lascano is a 77 y.o. male patient of Dr. [name] for a re -evaluation of sleep apnea and CPAP treatment.  Patient had a previous sleep study, referred by Dr. [name] after wife had voiced concerns about his snoring and occasional pausing in his breath. The patient had a history of hypertension, hearing loss, diabetes and a remote tobacco use / 40 pack years. His he had also reported nocturia at the time. The patient's sleep study took place on 07-22-2010, revealed an AHI that off 24.1 per hour of sleep. REM AHI exacerbated to 258.9, the patient's lowest oxygen saturation was 76% .He returned for a CPAP titration and was titrated to 12 cm water. In his first download he had a 90% reduction of apneas and reached an AHI of 2.5.  Epworth Sleepiness Score -1 point.  STUDY RESULTS: Total Recording Time: 9 hours, 21 minutes. Valid test time 3 h 22 minutes.   Total Apnea/Hypopnea Index (AHI): 9.5 /h; RDI: 12.5 /h Average Oxygen Saturation: 94%; Lowest Oxygen Desaturation: 83 %  Total Time Oxygen Saturation below 89 %: 2.0 minutes  Average Heart Rate:  61 bpm (between 51 and 87 bpm) IMPRESSION: Mild apnea and moderate UARS (upper airway resistance/ snoring). No associated clinically significant hypoxemia, arrhythmia, but the reduced valid test time has to be considered.  RECOMMENDATION: This degree of apnea can still be treated by CPAP( auto PAP) but can also be treated by dental device.   I certify that I have reviewed the raw data recording prior to the issuance of this report in accordance with the  standards of the American Academy of Sleep Medicine (AASM). Larey Seat, M.D.   10-29-2017

## 2017-10-29 NOTE — Procedures (Signed)
[facility] Sleep [facility] [address], [place] [address] NAME:  Derrick [name]                                                                DOB: February 03, 1941 MEDICAL RECORD NUMBER [id number]                                                         DOS: 10/25/2017  REFERRING PHYSICIAN: Burnard Bunting, M.D. STUDY PERFORMED: Home Sleep Study on Apnea Link HISTORY: Derrick [name]. Derrick [name] is a 76 y.o. male patient of Dr. [name] for a re -evaluation of sleep apnea and CPAP treatment.  Patient had a previous sleep study, referred by Dr. [name] after wife had voiced concerns about his snoring and occasional pausing in his breath. The patient had a history of hypertension, hearing loss, diabetes and a remote tobacco use / 40 pack years. His he had also reported nocturia at the time. The patient's sleep study took place on 07-22-2010, revealed an AHI that off 24.1 per hour of sleep. REM AHI exacerbated to 258.9, the patient's lowest oxygen saturation was 76% .He returned for a CPAP titration and was titrated to 12 cm water. In his first download he had a 90% reduction of apneas and reached an AHI of 2.5.  Epworth Sleepiness Score -1 point.  STUDY RESULTS: Total Recording Time: 9 hours, 21 minutes. Valid test time 3 h 22 minutes.   Total Apnea/Hypopnea Index (AHI): 9.5 /h; RDI: 12.5 /h Average Oxygen Saturation: 94%; Lowest Oxygen Desaturation: 83 %  Total Time Oxygen Saturation below 89 %: 2.0 minutes  Average Heart Rate:  61 bpm (between 51 and 87 bpm).  IMPRESSION: Mild apnea and moderate UARS (upper airway resistance/ snoring). No associated clinically significant hypoxemia, arrhythmia, but the reduced valid test time has to be considered.  RECOMMENDATION: This degree of apnea can still be treated by CPAP( auto PAP) but can also be treated by dental device.   I certify that I have reviewed the raw data recording prior to the issuance of this report in accordance with the  standards of the American Academy of Sleep Medicine (AASM). Larey Seat, M.D.   10-29-2017

## 2017-11-02 ENCOUNTER — Telehealth: Payer: Self-pay | Admitting: Neurology

## 2017-11-02 DIAGNOSIS — Z9989 Dependence on other enabling machines and devices: Principal | ICD-10-CM

## 2017-11-02 DIAGNOSIS — G4733 Obstructive sleep apnea (adult) (pediatric): Secondary | ICD-10-CM

## 2017-11-02 NOTE — Telephone Encounter (Signed)
-----   Message from Larey Seat, MD sent at 11/02/2017  8:36 AM EDT ----- HST IMPRESSION: Mild apnea and moderate UARS (upper airway  resistance/ snoring). No associated clinically significant  hypoxemia, arrhythmia, but the reduced valid test time has to be  considered.  RECOMMENDATION: This degree of apnea can still be treated by  CPAP( auto PAP) but can also be treated by dental device.   Please contact the patient about his preferences.

## 2017-11-02 NOTE — Telephone Encounter (Signed)
I have called the patient to discuss sleep study results. His wife answered. I was able to review these sleep study with her. The reason the study was completed was so that we could get the patient set up with a different company so that he could get CPAP supplies. Pt's wife thought he was due for a CPAP machine. When I look it up It appears he got one in 2016 and if that is the case he would not be. I will send all the orders over to [facility]. I informed the patients wife that if he does get a new CPAP he will need to follow up in 60-90 days. If not then he is fine following up yearly for CPAP supplies. Pt wife verbalized understanding.

## 2017-11-03 NOTE — Progress Notes (Signed)
I agree with the assessment and plan as directed by NP .The patient is known to me . Please assign another DME - will ask RN Mardene Celeste to make that referral.   Larey Seat, MD

## 2017-11-08 ENCOUNTER — Ambulatory Visit: Payer: Medicare Other | Admitting: Neurology

## 2017-11-23 DIAGNOSIS — H25042 Posterior subcapsular polar age-related cataract, left eye: Secondary | ICD-10-CM | POA: Diagnosis not present

## 2017-11-23 DIAGNOSIS — H2512 Age-related nuclear cataract, left eye: Secondary | ICD-10-CM | POA: Diagnosis not present

## 2017-12-16 DIAGNOSIS — R29810 Facial weakness: Secondary | ICD-10-CM | POA: Diagnosis not present

## 2017-12-16 DIAGNOSIS — R410 Disorientation, unspecified: Secondary | ICD-10-CM | POA: Diagnosis not present

## 2017-12-16 DIAGNOSIS — R402441 Other coma, without documented Glasgow coma scale score, or with partial score reported, in the field [EMT or ambulance]: Secondary | ICD-10-CM | POA: Diagnosis not present

## 2018-01-03 ENCOUNTER — Ambulatory Visit: Payer: Self-pay | Admitting: Nurse Practitioner

## 2019-05-05 ENCOUNTER — Encounter: Payer: Self-pay | Admitting: Internal Medicine

## 2019-07-25 ENCOUNTER — Other Ambulatory Visit: Payer: Self-pay

## 2019-07-25 ENCOUNTER — Encounter (HOSPITAL_COMMUNITY): Payer: Self-pay | Admitting: Emergency Medicine

## 2019-07-25 ENCOUNTER — Emergency Department (HOSPITAL_COMMUNITY)
Admission: EM | Admit: 2019-07-25 | Discharge: 2019-07-26 | Disposition: A | Discharge status: Home / Self Care | Payer: Medicare PPO | Attending: Emergency Medicine | Admitting: Emergency Medicine

## 2019-07-25 DIAGNOSIS — Z79899 Other long term (current) drug therapy: Secondary | ICD-10-CM | POA: Insufficient documentation

## 2019-07-25 DIAGNOSIS — Z87891 Personal history of nicotine dependence: Secondary | ICD-10-CM | POA: Diagnosis not present

## 2019-07-25 DIAGNOSIS — E119 Type 2 diabetes mellitus without complications: Secondary | ICD-10-CM | POA: Insufficient documentation

## 2019-07-25 DIAGNOSIS — Z9641 Presence of insulin pump (external) (internal): Secondary | ICD-10-CM | POA: Diagnosis not present

## 2019-07-25 DIAGNOSIS — R55 Syncope and collapse: Secondary | ICD-10-CM | POA: Insufficient documentation

## 2019-07-25 DIAGNOSIS — I10 Essential (primary) hypertension: Secondary | ICD-10-CM | POA: Diagnosis not present

## 2019-07-25 DIAGNOSIS — Z7982 Long term (current) use of aspirin: Secondary | ICD-10-CM | POA: Diagnosis not present

## 2019-07-25 DIAGNOSIS — Z794 Long term (current) use of insulin: Secondary | ICD-10-CM | POA: Insufficient documentation

## 2019-07-25 LAB — CBG MONITORING, ED: Glucose-Capillary: 195 mg/dL — ABNORMAL HIGH (ref 70–99)

## 2019-07-25 NOTE — ED Triage Notes (Signed)
79 year old BIB [facility] from [facility] complaining of near syncopal episode and fall witnessed by his spouse. Pt has history of dementia AOx3 at baseline. Pts wife states that he was trying to button his shirt when he was noticed to have some difficulty doing so, she states pt then fell back. Pt did not hit his head per wife. Pt does not remember the episode. Pt denies any pain, sob, chest pain, or headache. Stroke screen was negative per EMS. Pt is not on any anticoagulant therapy. No history of seizure or falls. Pt has had both doses of Covid vaccine, second dose was administered yesterday.  Vitals: bp 168/70 Hr 66 spo2 98 on room air  cbg 275 Temp 98.3 tympanic  18 g L AC

## 2019-07-25 NOTE — ED Provider Notes (Signed)
[facility] DEPT Provider Note: Georgena Spurling, MD, FACEP  CSN: [id number] MRN: [id number] ARRIVAL: 07/25/19 at 2319 ROOM: [place]  Near Syncope  Level 5 caveat: Dementia HISTORY OF PRESENT ILLNESS  07/25/19 11:37 PM Derrick [name] is a 79 y.o. male who had a near syncopal episode witnessed by his wife just prior to arrival.  He was attempting to button his shirt and had some difficulty doing so.  He then fell back but she was able to catch him before he injured himself.  The patient does not remember the episode but this is typical for him.  He denies any acute complaint.  EMS notes no focal neurologic changes.  He has no history of seizures.  He received a second dose of COVID-19 vaccine yesterday.  Blood sugar was 275 prior to arrival.   Past Medical History:  Diagnosis Date  . Cataract   . Complication of anesthesia    "high blood sugar; he's been loopy last 36h since OR"- 2016  . Diabetes mellitus   . DKA, type 2 ([place])   . Hearing loss   . Hypercholesteremia   . Hypertension   . Insulin pump in place   . Macular degeneration   . MCI (mild cognitive impairment) with memory loss 12/12/2014  . OSA on CPAP 09/11/2014  . PONV (postoperative nausea and vomiting)   . Uses hearing aid    right ear, patient stated he lost left hearing aid     Past Surgical History:  Procedure Laterality Date  . CHOLECYSTECTOMY N/A 10/28/2015   Procedure: LAPAROSCOPIC CHOLECYSTECTOMY WITH   INTRAOPERATIVE CHOLANGIOGRAM;  Surgeon: Greer Pickerel, MD;  Location: [facility];  Service: General;  Laterality: N/A;  . SHOULDER ARTHROSCOPY Right 08/24/11   "frozen shoulder; RCR w/labrum tear; arthritis scraping; bone spurs"  . SHOULDER ARTHROSCOPY Left ~ 2006  . TONSILLECTOMY  1949   "in childhood"    Family History  Problem Relation Age of Onset  . Heart disease Father   . High blood pressure Father   . Colon cancer Neg Hx     Social History   Tobacco Use  . Smoking status:  Former Smoker    Packs/day: 0.25    Years: 15.00    Pack years: 3.75    Types: Cigarettes    Quit date: 06/23/1983    Years since quitting: 36.1  . Smokeless tobacco: Never Used  Substance Use Topics  . Alcohol use: No    Alcohol/week: 0.0 standard drinks  . Drug use: No    Prior to Admission medications   Medication Sig Start Date End Date Taking? Authorizing Provider  amLODipine (NORVASC) 10 MG tablet Take 10 mg by mouth every evening.    Yes [provider]  aspirin EC 81 MG tablet Take 81 mg by mouth daily.   Yes [provider]  donepezil (ARICEPT) 10 MG tablet Take 10 mg by mouth at bedtime.   Yes [provider]  Insulin Glargine (BASAGLAR KWIKPEN) 100 UNIT/ML SOPN Inject 36 Units into the skin daily.    Yes [provider]  labetalol (NORMODYNE) 200 MG tablet Take 200 mg by mouth 2 (two) times daily. 07/08/15  Yes [provider]  levothyroxine (SYNTHROID, LEVOTHROID) 25 MCG tablet Take 25 mcg by mouth daily before breakfast.   Yes [provider]  lisinopril-hydrochlorothiazide (PRINZIDE,ZESTORETIC) 20-12.5 MG per tablet Take 1 tablet by mouth 2 (two) times daily.    Yes [provider]  Multiple  Vitamin (MULTIVITAMIN WITH MINERALS) TABS tablet Take 1 tablet by mouth daily.   Yes [provider]  Multiple Vitamins-Minerals (PRESERVISION AREDS 2 PO) Take 1 capsule by mouth 2 (two) times daily.   Yes [provider]  rosuvastatin (CRESTOR) 20 MG tablet Take 20 mg by mouth daily.   Yes [provider]    Allergies Patient has no known allergies.   REVIEW OF SYSTEMS     PHYSICAL EXAMINATION  Initial Vital Signs Blood pressure (!) 193/170, pulse 72, temperature 98.3 F (36.8 C), temperature source Oral, resp. rate 19, SpO2 98 %.  Examination General: Well-developed, well-nourished male in no acute distress; appearance consistent with age of record HENT: normocephalic;  atraumatic Eyes: pupils equal, round and reactive to light; extraocular muscles intact Neck: supple; nontender Heart: regular rate and rhythm Lungs: clear to auscultation bilaterally Abdomen: soft; nondistended; nontender; bowel sounds present Extremities: No deformity; full range of motion; pulses normal Neurologic: Awake, alert and oriented x 2; motor function intact in all extremities and symmetric; no facial droop Skin: Warm and dry Psychiatric: Normal mood and affect   RESULTS  Summary of this visit's results, reviewed and interpreted by myself:   EKG Interpretation  Date/Time:  Tuesday July 25 2019 23:34:05 EST Ventricular Rate:  73 PR Interval:    QRS Duration: 111 QT Interval:  429 QTC Calculation: 473 R Axis:   -31 Text Interpretation: Sinus rhythm Left axis deviation Borderline T abnormalities, diffuse leads Artifact Otherwise no significant change Confirmed by Shanon Rosser [phone]) on 07/25/2019 11:37:25 PM      Laboratory Studies: Results for orders placed or performed during the hospital encounter of 07/25/19 (from the past 24 hour(s))  CBG monitoring, ED     Status: Abnormal   Collection Time: 07/25/19 11:35 PM  Result Value Ref Range   Glucose-Capillary 195 (H) 70 - 99 mg/dL  CBC with Differential/Platelet     Status: None   Collection Time: 07/26/19 12:10 AM  Result Value Ref Range   WBC 8.0 4.0 - 10.5 K/uL   RBC 4.54 4.22 - 5.81 MIL/uL   Hemoglobin 14.1 13.0 - 17.0 g/dL   HCT 42.0 39.0 - 52.0 %   MCV 92.5 80.0 - 100.0 fL   MCH 31.1 26.0 - 34.0 pg   MCHC 33.6 30.0 - 36.0 g/dL   RDW 13.2 11.5 - 15.5 %   Platelets 170 150 - 400 K/uL   nRBC 0.0 0.0 - 0.2 %   Neutrophils Relative % 80 %   Neutro Abs 6.4 1.7 - 7.7 K/uL   Lymphocytes Relative 8 %   Lymphs Abs 0.7 0.7 - 4.0 K/uL   Monocytes Relative 10 %   Monocytes Absolute 0.8 0.1 - 1.0 K/uL   Eosinophils Relative 1 %   Eosinophils Absolute 0.1 0.0 - 0.5 K/uL   Basophils Relative 1 %   Basophils  Absolute 0.0 0.0 - 0.1 K/uL   Immature Granulocytes 0 %   Abs Immature Granulocytes 0.01 0.00 - 0.07 K/uL  Basic metabolic panel     Status: Abnormal   Collection Time: 07/26/19 12:10 AM  Result Value Ref Range   Sodium 142 135 - 145 mmol/L   Potassium 3.4 (L) 3.5 - 5.1 mmol/L   Chloride 102 98 - 111 mmol/L   CO2 29 22 - 32 mmol/L   Glucose, Bld 203 (H) 70 - 99 mg/dL   BUN 22 8 - 23 mg/dL   Creatinine, Ser 0.96 0.61 - 1.24 mg/dL  Calcium 9.2 8.9 - 10.3 mg/dL   GFR calc non Af Amer >60 >60 mL/min   GFR calc Af Amer >60 >60 mL/min   Anion gap 11 5 - 15  Urinalysis, Routine w reflex microscopic     Status: Abnormal   Collection Time: 07/26/19 12:10 AM  Result Value Ref Range   Color, Urine YELLOW YELLOW   APPearance CLEAR CLEAR   Specific Gravity, Urine 1.026 1.005 - 1.030   pH 6.0 5.0 - 8.0   Glucose, UA 50 (A) NEGATIVE mg/dL   Hgb urine dipstick NEGATIVE NEGATIVE   Bilirubin Urine NEGATIVE NEGATIVE   Ketones, ur 5 (A) NEGATIVE mg/dL   Protein, ur 30 (A) NEGATIVE mg/dL   Nitrite NEGATIVE NEGATIVE   Leukocytes,Ua NEGATIVE NEGATIVE   RBC / HPF 11-20 0 - 5 RBC/hpf   WBC, UA 0-5 0 - 5 WBC/hpf   Bacteria, UA NONE SEEN NONE SEEN   Mucus PRESENT    Hyaline Casts, UA PRESENT    Imaging Studies: CT Head Wo Contrast  Result Date: 07/26/2019 CLINICAL DATA:  Recent syncopal episode EXAM: CT HEAD WITHOUT CONTRAST TECHNIQUE: Contiguous axial images were obtained from the base of the skull through the vertex without intravenous contrast. COMPARISON:  12/16/17 FINDINGS: Brain: Chronic atrophic changes and white matter ischemic changes are seen. Prior lacunar infarcts in the basal ganglia and thalami seen stable. Findings to suggest acute hemorrhage, acute infarction or space-occupying mass lesion are seen. Vascular: No hyperdense vessel or unexpected calcification. Skull: Normal. Negative for fracture or focal lesion. Sinuses/Orbits: No acute finding. Other: None. IMPRESSION: Chronic atrophic  and ischemic changes without acute abnormality. Electronically Signed   By: Inez Catalina M.D.   On: 07/26/2019 00:57    ED COURSE and MDM  Nursing notes, initial and subsequent vitals signs, including pulse oximetry, reviewed and interpreted by myself.  Vitals:   07/25/19 2330 07/26/19 0057 07/26/19 0324  BP: (!) 193/170 (!) 142/70 (!) 153/74  Pulse: 72 73 81  Resp: 19 16 18   Temp: 98.3 F (36.8 C)    TempSrc: Oral    SpO2: 98% 98% 97%   Medications  sodium chloride 0.9 % bolus 500 mL (0 mLs Intravenous Stopped 07/26/19 0324)   2:19 AM Patient's work-up is reassuring except borderline dehydration on urinalysis.  We will give him a fluid bolus prior to discharge.  His wife was advised to encourage the patient to keep well-hydrated and to keep his sugar under control.   PROCEDURES  Procedures   ED DIAGNOSES     ICD-10-CM   1. Near syncope  R55        Crystall Donaldson, Jenny Reichmann, MD 07/26/19 [phone]

## 2019-07-26 ENCOUNTER — Emergency Department (HOSPITAL_COMMUNITY): Payer: Medicare PPO

## 2019-07-26 ENCOUNTER — Encounter (HOSPITAL_COMMUNITY): Payer: Self-pay | Admitting: Emergency Medicine

## 2019-07-26 DIAGNOSIS — R55 Syncope and collapse: Secondary | ICD-10-CM | POA: Diagnosis not present

## 2019-07-26 LAB — CBC WITH DIFFERENTIAL/PLATELET
Abs Immature Granulocytes: 0.01 10*3/uL (ref 0.00–0.07)
Basophils Absolute: 0 10*3/uL (ref 0.0–0.1)
Basophils Relative: 1 %
Eosinophils Absolute: 0.1 10*3/uL (ref 0.0–0.5)
Eosinophils Relative: 1 %
HCT: 42 % (ref 39.0–52.0)
Hemoglobin: 14.1 g/dL (ref 13.0–17.0)
Immature Granulocytes: 0 %
Lymphocytes Relative: 8 %
Lymphs Abs: 0.7 10*3/uL (ref 0.7–4.0)
MCH: 31.1 pg (ref 26.0–34.0)
MCHC: 33.6 g/dL (ref 30.0–36.0)
MCV: 92.5 fL (ref 80.0–100.0)
Monocytes Absolute: 0.8 10*3/uL (ref 0.1–1.0)
Monocytes Relative: 10 %
Neutro Abs: 6.4 10*3/uL (ref 1.7–7.7)
Neutrophils Relative %: 80 %
Platelets: 170 10*3/uL (ref 150–400)
RBC: 4.54 MIL/uL (ref 4.22–5.81)
RDW: 13.2 % (ref 11.5–15.5)
WBC: 8 10*3/uL (ref 4.0–10.5)
nRBC: 0 % (ref 0.0–0.2)

## 2019-07-26 LAB — URINALYSIS, ROUTINE W REFLEX MICROSCOPIC
Bacteria, UA: NONE SEEN
Bilirubin Urine: NEGATIVE
Glucose, UA: 50 mg/dL — AB
Hgb urine dipstick: NEGATIVE
Ketones, ur: 5 mg/dL — AB
Leukocytes,Ua: NEGATIVE
Nitrite: NEGATIVE
Protein, ur: 30 mg/dL — AB
Specific Gravity, Urine: 1.026 (ref 1.005–1.030)
pH: 6 (ref 5.0–8.0)

## 2019-07-26 LAB — BASIC METABOLIC PANEL
Anion gap: 11 (ref 5–15)
BUN: 22 mg/dL (ref 8–23)
CO2: 29 mmol/L (ref 22–32)
Calcium: 9.2 mg/dL (ref 8.9–10.3)
Chloride: 102 mmol/L (ref 98–111)
Creatinine, Ser: 0.96 mg/dL (ref 0.61–1.24)
GFR calc Af Amer: >60 mL/min (ref >60)
GFR calc non Af Amer: >60 mL/min (ref >60)
Glucose, Bld: 203 mg/dL — ABNORMAL HIGH (ref 70–99)
Potassium: 3.4 mmol/L — ABNORMAL LOW (ref 3.5–5.1)
Sodium: 142 mmol/L (ref 135–145)

## 2019-07-26 MED ORDER — SODIUM CHLORIDE 0.9 % IV BOLUS
500.0000 mL | Freq: Once | INTRAVENOUS | Status: AC
  Administered 2019-07-26: 03:00:00 500 mL via INTRAVENOUS

## 2019-07-26 NOTE — ED Notes (Signed)
Patient ambulated from bed to wheelchair with no difficulty. Assisted into vehicle with wife.

## 2019-07-26 NOTE — ED Notes (Signed)
Pt stating he does not need to urinate at this time, but agrees to try.

## 2019-08-28 ENCOUNTER — Encounter (HOSPITAL_COMMUNITY): Payer: Self-pay | Admitting: Emergency Medicine

## 2019-08-28 ENCOUNTER — Other Ambulatory Visit: Payer: Self-pay

## 2019-08-28 ENCOUNTER — Emergency Department (HOSPITAL_COMMUNITY)
Admission: EM | Admit: 2019-08-28 | Discharge: 2019-08-28 | Disposition: A | Discharge status: Home / Self Care | Payer: Medicare PPO | Attending: Emergency Medicine | Admitting: Emergency Medicine

## 2019-08-28 ENCOUNTER — Emergency Department (HOSPITAL_COMMUNITY): Payer: Medicare PPO

## 2019-08-28 DIAGNOSIS — Z79899 Other long term (current) drug therapy: Secondary | ICD-10-CM | POA: Insufficient documentation

## 2019-08-28 DIAGNOSIS — I10 Essential (primary) hypertension: Secondary | ICD-10-CM | POA: Insufficient documentation

## 2019-08-28 DIAGNOSIS — E119 Type 2 diabetes mellitus without complications: Secondary | ICD-10-CM | POA: Insufficient documentation

## 2019-08-28 DIAGNOSIS — Z7982 Long term (current) use of aspirin: Secondary | ICD-10-CM | POA: Insufficient documentation

## 2019-08-28 DIAGNOSIS — R55 Syncope and collapse: Secondary | ICD-10-CM | POA: Diagnosis not present

## 2019-08-28 DIAGNOSIS — Z794 Long term (current) use of insulin: Secondary | ICD-10-CM | POA: Insufficient documentation

## 2019-08-28 DIAGNOSIS — Z87891 Personal history of nicotine dependence: Secondary | ICD-10-CM | POA: Insufficient documentation

## 2019-08-28 LAB — CBG MONITORING, ED: Glucose-Capillary: 98 mg/dL (ref 70–99)

## 2019-08-28 LAB — CBC
HCT: 42.7 % (ref 39.0–52.0)
Hemoglobin: 14.5 g/dL (ref 13.0–17.0)
MCH: 31.5 pg (ref 26.0–34.0)
MCHC: 34 g/dL (ref 30.0–36.0)
MCV: 92.6 fL (ref 80.0–100.0)
Platelets: 199 10*3/uL (ref 150–400)
RBC: 4.61 MIL/uL (ref 4.22–5.81)
RDW: 13.1 % (ref 11.5–15.5)
WBC: 11.5 10*3/uL — ABNORMAL HIGH (ref 4.0–10.5)
nRBC: 0 % (ref 0.0–0.2)

## 2019-08-28 LAB — BASIC METABOLIC PANEL
Anion gap: 6 (ref 5–15)
BUN: 21 mg/dL (ref 8–23)
CO2: 33 mmol/L — ABNORMAL HIGH (ref 22–32)
Calcium: 9.3 mg/dL (ref 8.9–10.3)
Chloride: 105 mmol/L (ref 98–111)
Creatinine, Ser: 0.97 mg/dL (ref 0.61–1.24)
GFR calc Af Amer: >60 mL/min (ref >60)
GFR calc non Af Amer: >60 mL/min (ref >60)
Glucose, Bld: 100 mg/dL — ABNORMAL HIGH (ref 70–99)
Potassium: 3.4 mmol/L — ABNORMAL LOW (ref 3.5–5.1)
Sodium: 144 mmol/L (ref 135–145)

## 2019-08-28 MED ORDER — SODIUM CHLORIDE 0.9% FLUSH: 3.0000 mL | Freq: Once | INTRAVENOUS | Status: DC

## 2019-08-28 MED ORDER — SODIUM CHLORIDE 0.9 % IV BOLUS
1000.0000 mL | Freq: Once | INTRAVENOUS | Status: AC
  Administered 2019-08-28: 1000 mL via INTRAVENOUS

## 2019-08-28 NOTE — Discharge Instructions (Signed)
I discussed this case with the cardiologist.  He is planning on having their office get in touch with you to schedule an appointment for a ultrasound of the heart as well as a Holter monitor.  Please return for repeat event or if you have persistent chest pain shortness of breath or headache.  And drink well for the next 48 hours.

## 2019-08-28 NOTE — ED Notes (Signed)
Derrick [name] would like to be called at [phone] for any updates.

## 2019-08-28 NOTE — ED Provider Notes (Signed)
[facility] DEPT Provider Note   CSN: [id number] Arrival date & time: 08/28/19  1330     History Chief Complaint  Patient presents with  . Loss of Consciousness    Derrick [name] is a 79 y.o. male.  79 yo M with a chief complaint of a syncopal event.  Patient was at the he was standing at a funeral.  Felt bad, and collapsed to the ground.  He denies any injury.  He thinks that he may be had a mild headache prior to the event.  He woke up and he had a little bit of emesis.  On the way back from the funeral he had a couple more episodes of vomiting in the car they pulled over and called 911 who evaluated him and felt that his blood sugar and EKG were okay and his blood pressure was okay and recommended to continue to drive until he is back to his home town so that he can be seen in the emergency department there.  They called their family doctor who also agreed this was the best plan.  He feels that he has been having some trouble controlling his blood sugars off and on.  They felt that his blood sugar might be low and have given him some chocolate to eat with the initial event.  He actually had a similar event about a month ago.  Patient denies headache currently denies neck pain denies chest pain or shortness of breath.  Denies abdominal pain or current nausea.  Has been eating and drinking normally.  Denies any recent new medications.  After his last syncopal event there was no follow-up.  The history is provided by the patient.  Loss of Consciousness Episode history:  Single Most recent episode:  Today Duration:  30 seconds Timing:  Rare Progression:  Resolved Chronicity:  New Witnessed: no   Relieved by:  Nothing Worsened by:  Nothing Ineffective treatments:  None tried Associated symptoms: nausea and vomiting   Associated symptoms: no chest pain, no confusion, no fever, no headaches, no palpitations and no shortness of breath        Past  Medical History:  Diagnosis Date  . Cataract   . Complication of anesthesia    "high blood sugar; he's been loopy last 36h since OR"- 2016  . Diabetes mellitus   . DKA, type 2 ([place])   . Hearing loss   . Hypercholesteremia   . Hypertension   . Insulin pump in place   . Macular degeneration   . MCI (mild cognitive impairment) with memory loss 12/12/2014  . OSA on CPAP 09/11/2014  . PONV (postoperative nausea and vomiting)   . Uses hearing aid    right ear, patient stated he lost left hearing aid     Patient Active Problem List   Diagnosis Date Noted  . SOB (shortness of breath)   . Hypokalemia   . Diabetic ketoacidosis without coma associated with type 2 diabetes mellitus ([place]) 06/15/2016  . AKI (acute kidney injury) ([place]) 06/15/2016  . Diabetes mellitus ([place]) 10/29/2015  . HTN (hypertension) 10/29/2015  . S/P laparoscopic cholecystectomy 10/28/2015  . OSA on CPAP 09/11/2014    Past Surgical History:  Procedure Laterality Date  . CHOLECYSTECTOMY N/A 10/28/2015   Procedure: LAPAROSCOPIC CHOLECYSTECTOMY WITH   INTRAOPERATIVE CHOLANGIOGRAM;  Surgeon: Greer Pickerel, MD;  Location: [facility];  Service: General;  Laterality: N/A;  . SHOULDER ARTHROSCOPY Right 08/24/11   "frozen shoulder; RCR w/labrum tear; arthritis  scraping; bone spurs"  . SHOULDER ARTHROSCOPY Left ~ 2006  . TONSILLECTOMY  1949   "in childhood"       Family History  Problem Relation Age of Onset  . Heart disease Father   . High blood pressure Father   . Colon cancer Neg Hx     Social History   Tobacco Use  . Smoking status: Former Smoker    Packs/day: 0.25    Years: 15.00    Pack years: 3.75    Types: Cigarettes    Quit date: 06/23/1983    Years since quitting: 36.2  . Smokeless tobacco: Never Used  Substance Use Topics  . Alcohol use: No    Alcohol/week: 0.0 standard drinks  . Drug use: No    Home Medications Prior to Admission medications   Medication Sig Start Date End Date Taking? Authorizing  Provider  amLODipine (NORVASC) 10 MG tablet Take 10 mg by mouth every evening.     [provider]  aspirin EC 81 MG tablet Take 81 mg by mouth daily.    [provider]  donepezil (ARICEPT) 10 MG tablet Take 10 mg by mouth at bedtime.    [provider]  Insulin Glargine (BASAGLAR KWIKPEN) 100 UNIT/ML SOPN Inject 36 Units into the skin daily.     [provider]  labetalol (NORMODYNE) 200 MG tablet Take 200 mg by mouth 2 (two) times daily. 07/08/15   [provider]  levothyroxine (SYNTHROID, LEVOTHROID) 25 MCG tablet Take 25 mcg by mouth daily before breakfast.    [provider]  lisinopril-hydrochlorothiazide (PRINZIDE,ZESTORETIC) 20-12.5 MG per tablet Take 1 tablet by mouth 2 (two) times daily.     [provider]  Multiple Vitamin (MULTIVITAMIN WITH MINERALS) TABS tablet Take 1 tablet by mouth daily.    [provider]  Multiple Vitamins-Minerals (PRESERVISION AREDS 2 PO) Take 1 capsule by mouth 2 (two) times daily.    [provider]  rosuvastatin (CRESTOR) 20 MG tablet Take 20 mg by mouth daily.    [provider]    Allergies    Patient has no known allergies.  Review of Systems   Review of Systems  Constitutional: Negative for chills and fever.  HENT: Negative for congestion and facial swelling.   Eyes: Negative for discharge and visual disturbance.  Respiratory: Negative for shortness of breath.   Cardiovascular: Positive for syncope. Negative for chest pain and palpitations.  Gastrointestinal: Positive for nausea and vomiting. Negative for abdominal pain and diarrhea.  Musculoskeletal: Negative for arthralgias and myalgias.  Skin: Negative for color change and rash.  Neurological: Positive for syncope. Negative for tremors and headaches.  Psychiatric/Behavioral: Negative for confusion and dysphoric mood.    Physical Exam Updated Vital Signs BP 140/66   Pulse 79   Temp (!) 97.5 F  (36.4 C) (Oral)   Resp 18   Ht 5\' 6"  (1.676 m)   Wt 79.4 kg   SpO2 100%   BMI 28.25 kg/m   Physical Exam Vitals and nursing note reviewed.  Constitutional:      Appearance: He is well-developed.  HENT:     Head: Normocephalic and atraumatic.  Eyes:     Pupils: Pupils are equal, round, and reactive to light.  Neck:     Vascular: No JVD.  Cardiovascular:     Rate and Rhythm: Normal rate and regular rhythm.     Heart sounds: No murmur. No friction rub. No gallop.   Pulmonary:  Effort: No respiratory distress.     Breath sounds: No wheezing.  Abdominal:     General: There is no distension.     Tenderness: There is no guarding or rebound.  Musculoskeletal:        General: Normal range of motion.     Cervical back: Normal range of motion and neck supple.  Skin:    Coloration: Skin is not pale.     Findings: No rash.  Neurological:     Mental Status: He is alert and oriented to person, place, and time.     Comments: Facial twitching which is chronic for the patient otherwise benign neuro exam  Psychiatric:        Behavior: Behavior normal.     ED Results / Procedures / Treatments   Labs (all labs ordered are listed, but only abnormal results are displayed) Labs Reviewed  BASIC METABOLIC PANEL - Abnormal; Notable for the following components:      Result Value   Potassium 3.4 (*)    CO2 33 (*)    Glucose, Bld 100 (*)    All other components within normal limits  CBC - Abnormal; Notable for the following components:   WBC 11.5 (*)    All other components within normal limits  URINALYSIS, ROUTINE W REFLEX MICROSCOPIC  CBG MONITORING, ED    EKG EKG Interpretation  Date/Time:  Monday August 28 2019 14:35:13 EST Ventricular Rate:  68 PR Interval:    QRS Duration: 111 QT Interval:  458 QTC Calculation: 488 R Axis:   -40 Text Interpretation: Sinus rhythm Left anterior fascicular block Borderline T abnormalities, inferior leads Borderline prolonged QT interval  No significant change since last tracing Confirmed by Deno Etienne [phone]) on 08/28/2019 2:39:15 PM   Radiology CT Head Wo Contrast  Result Date: 08/28/2019 CLINICAL DATA:  Recurrent syncope. EXAM: CT HEAD WITHOUT CONTRAST TECHNIQUE: Contiguous axial images were obtained from the base of the skull through the vertex without intravenous contrast. Automatic exposure control utilized. COMPARISON:  July 26, 2019 FINDINGS: Brain: No evidence of acute infarction, hemorrhage, hydrocephalus, extra-axial collection or mass lesion/mass effect. Chronic left thalamic and lentiform nucleus lacunar infarction. Extensive chronic bilateral cerebral white matter disease with chronic bilateral white matter infarctions. Vascular: Calcified atherosclerosis of the vertebrobasilar arterial system and bilateral carotid siphons. No dense vessel sign. Skull: Small benign left parietal hemangioma, unchanged. No skull fracture. Mild demineralization at the skull base. Sinuses/Orbits: Minimal bilateral ethmoidal mucosal thickening. Normal orbital soft tissues. Other: Debris, probably cerumen in the bilateral external auditory canals. IMPRESSION: 1. No CT evidence of acute intracranial abnormality. 2. Chronic microvascular ischemic disease with chronic left thalamic and lentiform nucleus lacunar infarctions and associated intracranial atherosclerosis. 3. Cerumen in the bilateral external auditory canals. 4. Minimal bilateral ethmoidal mucoperiosteal thickening. Electronically Signed   By: Revonda Humphrey   On: 08/28/2019 16:45    Procedures Procedures (including critical care time)   "The cardiac monitor revealed nsr rhythm as interpreted by me. The cardiac monitor was ordered secondary to the patient's history of syncope and to monitor the patient for dysrhythmia."   Medications Ordered in ED Medications  sodium chloride flush (NS) 0.9 % injection 3 mL (has no administration in time range)  sodium chloride 0.9 % bolus 1,000 mL (0  mLs Intravenous Stopped 08/28/19 1636)    ED Course  I have reviewed the triage vital signs and the nursing notes.  Pertinent labs & imaging results that were available during my care of the  patient were reviewed by me and considered in my medical decision making (see chart for details).    MDM Rules/Calculators/A&P                      79 yo M with a chief complaint of a syncopal event.  This occurred while he was at a funeral.  Patient states that he felt somewhat bad and had a mild headache and then woke up on the ground.  Sounds vasovagal by history, the most concerning thing is that the patient had a similar episode just about a month ago.  His EKG is also very difficult to identify P waves and so is hard to tell if he is having a new arrhythmia.  Will give a bolus of IV fluids check lab work repeat EKG.  I discussed the case with Dr. Marlou Porch, cardiology he independently reviewed the 3 EKGs are performed today and agree that there is no obvious AV nodal blockade.  He recommended scheduling the patient for more urgent follow-up for an echo and maybe 14-day Holter monitoring.  I discussed this with the wife who is in agreement.  Lab work is otherwise unremarkable.  Awaiting head CT.  CT head negative.   4:52 PM:  I have discussed the diagnosis/risks/treatment options with the patient and believe the pt to be eligible for discharge home to follow-up with PCP. We also discussed returning to the ED immediately if new or worsening sx occur. We discussed the sx which are most concerning (e.g., sudden worsening pain, fever, inability to tolerate by mouth) that necessitate immediate return. Medications administered to the patient during their visit and any new prescriptions provided to the patient are listed below.  Medications given during this visit Medications  sodium chloride flush (NS) 0.9 % injection 3 mL (has no administration in time range)  sodium chloride 0.9 % bolus 1,000 mL (0 mLs  Intravenous Stopped 08/28/19 1636)     The patient appears reasonably screen and/or stabilized for discharge and I doubt any other medical condition or other [facility] requiring further screening, evaluation, or treatment in the ED at this time prior to discharge.   Final Clinical Impression(s) / ED Diagnoses Final diagnoses:  Syncope and collapse    Rx / DC Orders ED Discharge Orders    None       Deno Etienne, DO 08/28/19 1652

## 2019-08-28 NOTE — ED Notes (Signed)
Spoke with Tyrone Nine who verbalizes en route to see pt and place additional orders as needed.

## 2019-08-28 NOTE — ED Notes (Addendum)
[name]/wife [phone]; wife verbalizes that they were at a funeral and "he just fell out on the grass." Wife verbalizes he did not hit his head; she gave him a Annabell Sabal kiss thinking it could have been his blood sugar; hx of diabetes. Then stopped at [facility] for him to eat a burger. Per wife "he just isn't acting right." Pt extremities strong, face symmetrical, denies numbness. Pt disoriented to date; unable to state date and verbalizes "it's Tuesday." Pt aware he is at the hospital, that he fell, and alert to self.

## 2019-08-29 ENCOUNTER — Telehealth: Payer: Self-pay | Admitting: Cardiology

## 2019-08-29 NOTE — Telephone Encounter (Signed)
Spoke with patient's wife - will add a note for her to be allowed to his appointment.

## 2019-08-29 NOTE — Telephone Encounter (Signed)
[name] the patient's wife is calling requesting she attend Derrick [name]'s upcoming appt scheduled for 08/31/19 due to [place] having memory issues and not being able to understand the Doctor well.

## 2019-08-31 ENCOUNTER — Encounter: Payer: Self-pay | Admitting: Cardiology

## 2019-08-31 ENCOUNTER — Ambulatory Visit (INDEPENDENT_AMBULATORY_CARE_PROVIDER_SITE_OTHER): Payer: Medicare PPO | Admitting: Cardiology

## 2019-08-31 ENCOUNTER — Other Ambulatory Visit: Payer: Self-pay

## 2019-08-31 VITALS — BP 124/66 | HR 62 | Ht 66.5 in | Wt 180.8 lb

## 2019-08-31 DIAGNOSIS — I10 Essential (primary) hypertension: Secondary | ICD-10-CM

## 2019-08-31 DIAGNOSIS — E119 Type 2 diabetes mellitus without complications: Secondary | ICD-10-CM

## 2019-08-31 DIAGNOSIS — R55 Syncope and collapse: Secondary | ICD-10-CM

## 2019-08-31 NOTE — Progress Notes (Signed)
Cardiology Office Consult Note    Date:  08/31/2019   ID:  Derrick [name], DOB [birth year], MRN [id number]  PCP:  Burnard Bunting, MD  Cardiologist:  Fransico Him, MD   Chief Complaint  Patient presents with  . New Patient (Initial Visit)    Syncope, HTN, HLD    History of Present Illness:  Derrick [name] is a 79 y.o. male who is being seen today for the evaluation of syncope at the request of Burnard Bunting, MD.  This is a 79yo male with a hx of DM2, HLD, HTN, OSA who recently suffered a syncopal episode and is now referred for further evaluation.  Apparently the patient was standing at a funeral. He started to feel bad and then suddenly collapsed to the ground.  He did not suffer any injuries.  He thinks that he may be had a mild headache prior to the event.  He woke up and he had a little bit of emesis.  On the way back from the funeral he had a couple more episodes of vomiting in the car they pulled over and called 911 who evaluated him and felt that his blood sugar and EKG were okay and his blood pressure was okay and recommended to continue to drive until he is back to his home town so that he can be seen in the emergency department there.  They felt that his blood sugar might be low and have given him some chocolate to eat with the initial event.  He actually had a similar event about a month ago.  After his last syncopal event there was no follow-up.  His wife is with him and told me that he was feeling fine and had been standing for a while at the funeral and then all of a sudden fell backwards and was out for about a minute.  He does not remember the event but his wife states that he denied any palpitations, dizziness or CP prior to the event.  He had another syncopal episode while standing in the bathroom getting ready to brush his teeth.  He did not feel bad prior to the event and does not remember the event.  His wife says that he just fell over. He denies any  dizziness, LE edema, chest pain, SOB, PND, orthopnea or palpitations.    Past Medical History:  Diagnosis Date  . Cataract   . Complication of anesthesia    "high blood sugar; he's been loopy last 36h since OR"- 2016  . Diabetes mellitus   . DKA, type 2 ([place])   . Hearing loss   . Hypercholesteremia   . Hypertension   . Insulin pump in place   . Macular degeneration   . MCI (mild cognitive impairment) with memory loss 12/12/2014  . OSA on CPAP 09/11/2014  . PONV (postoperative nausea and vomiting)   . Uses hearing aid    right ear, patient stated he lost left hearing aid     Past Surgical History:  Procedure Laterality Date  . CHOLECYSTECTOMY N/A 10/28/2015   Procedure: LAPAROSCOPIC CHOLECYSTECTOMY WITH   INTRAOPERATIVE CHOLANGIOGRAM;  Surgeon: Greer Pickerel, MD;  Location: [facility];  Service: General;  Laterality: N/A;  . SHOULDER ARTHROSCOPY Right 08/24/11   "frozen shoulder; RCR w/labrum tear; arthritis scraping; bone spurs"  . SHOULDER ARTHROSCOPY Left ~ 2006  . TONSILLECTOMY  1949   "in childhood"    Current Medications: Current Meds  Medication Sig  . amLODipine (NORVASC) 10 MG tablet  Take 10 mg by mouth every evening.   Marland Kitchen aspirin EC 81 MG tablet Take 81 mg by mouth daily.  Marland Kitchen donepezil (ARICEPT) 10 MG tablet Take 10 mg by mouth at bedtime.  Marland Kitchen HUMALOG KWIKPEN 100 UNIT/ML KwikPen   . labetalol (NORMODYNE) 200 MG tablet Take 200 mg by mouth 2 (two) times daily.  Marland Kitchen levothyroxine (SYNTHROID, LEVOTHROID) 25 MCG tablet Take 25 mcg by mouth daily before breakfast.  . lisinopril-hydrochlorothiazide (PRINZIDE,ZESTORETIC) 20-12.5 MG per tablet Take 1 tablet by mouth 2 (two) times daily.   . Multiple Vitamin (MULTIVITAMIN WITH MINERALS) TABS tablet Take 1 tablet by mouth daily.  . Multiple Vitamins-Minerals (PRESERVISION AREDS 2 PO) Take 1 capsule by mouth 2 (two) times daily.  . rosuvastatin (CRESTOR) 20 MG tablet Take 20 mg by mouth daily.  [name] FLEXTOUCH 100 UNIT/ML FlexTouch Pen      Allergies:   Patient has no known allergies.   Social History   Socioeconomic History  . Marital status: Married    Spouse name: [name]   . Number of children: 2  . Years of education: masters  . Highest education level: Not on file  Occupational History    Comment: Retired.  Tobacco Use  . Smoking status: Former Smoker    Packs/day: 0.25    Years: 15.00    Pack years: 3.75    Types: Cigarettes    Quit date: 06/23/1983    Years since quitting: 36.2  . Smokeless tobacco: Never Used  Substance and Sexual Activity  . Alcohol use: No    Alcohol/week: 0.0 standard drinks  . Drug use: No  . Sexual activity: Yes  Other Topics Concern  . Not on file  Social History Narrative   Patient lives at home with his wife [name]).   Retired.   Education Master's degree.   Right handed.   Caffeine three cups of coffee daily.   Social Determinants of Health   Financial Resource Strain:   . Difficulty of Paying Living Expenses:   Food Insecurity:   . Worried About Charity fundraiser in the Last Year:   . Arboriculturist in the Last Year:   Transportation Needs:   . Film/video editor (Medical):   Marland Kitchen Lack of Transportation (Non-Medical):   Physical Activity:   . Days of Exercise per Week:   . Minutes of Exercise per Session:   Stress:   . Feeling of Stress :   Social Connections:   . Frequency of Communication with Friends and Family:   . Frequency of Social Gatherings with Friends and Family:   . Attends Religious Services:   . Active Member of Clubs or Organizations:   . Attends Archivist Meetings:   Marland Kitchen Marital Status:      Family History:  The patient's family history includes Heart disease in his father; High blood pressure in his father.   ROS:   Please see the history of present illness.    ROS All other systems reviewed and are negative.  No flowsheet data found.   PHYSICAL EXAM:   VS:  BP 124/66   Pulse 62   Ht 5' 6.5" (1.689 m)   Wt 180  lb 12.8 oz (82 kg)   SpO2 96%   BMI 28.74 kg/m    GEN: Well nourished, well developed, in no acute distress  HEENT: normal  Neck: no JVD, carotid bruits, or masses Cardiac: RRR; no murmurs, rubs, or gallops,no edema.  Intact distal  pulses bilaterally.  Respiratory:  clear to auscultation bilaterally, normal work of breathing GI: soft, nontender, nondistended, + BS MS: no deformity or atrophy  Skin: warm and dry, no rash Neuro:  Alert and Oriented x 3, Strength and sensation are intact Psych: euthymic mood, full affect   Wt Readings from Last 3 Encounters:  08/31/19 180 lb 12.8 oz (82 kg)  08/28/19 175 lb (79.4 kg)  04/04/17 160 lb (72.6 kg)    No data found.   Studies/Labs Reviewed:   EKG:  EKG is not ordered today.  Recent Labs: 08/28/2019: BUN 21; Creatinine, Ser 0.97; Hemoglobin 14.5; Platelets 199; Potassium 3.4; Sodium 144   Lipid Panel No results found for: CHOL, TRIG, HDL, CHOLHDL, VLDL, LDLCALC, LDLDIRECT  Additional studies/ records that were reviewed today include:  Hospital notes from ER, EKG, hospital labs  ASSESSMENT:    1. Syncope and collapse   2. Essential hypertension   3. DM type 2, goal HbA1c < 7% (HCC)      PLAN:  In order of problems listed above:  1.  Syncope -unclear etiology -does not sound like low BS since it was fine when EMS arrived -he had been standing prior to the event each time so may be orthostatic in nature - orthostatics in office today showed lying BP 110/66>>sitting 111/69>>standing 99/44mmhg and standing 3 min 104/53mmhg with HR in the 60's.   -also need to consider arrhythmia since he has no warning signs prior to the events -will check carotid dopplers and 2D echo -refer to EP for consideration of implantable loop recorder -stop diuretic portion of Lisinopril HCT and decrease labatolol to 100mg  BID -instructed him not to drive for 6 months  2.  HTN -CP controlled -continue amlodipine 10mg  daily -decrease Labetolol to  100mg  BID and stop HCT portion of lisinopril HCT -followup with PA in 4 weeks  3. DM2 -followed by PCP -continue insulin   Medication Adjustments/Labs and Tests Ordered: Current medicines are reviewed at length with the patient today.  Concerns regarding medicines are outlined above.  Medication changes, Labs and Tests ordered today are listed in the Patient Instructions below.  Patient Instructions  Medication Instructions:  Your physician recommends that you continue on your current medications as directed. Please refer to the Current Medication list given to you today.  *If you need a refill on your cardiac medications before your next appointment, please call your pharmacy*   Testing/Procedures: Your physician has requested that you have an echocardiogram. Echocardiography is a painless test that uses sound waves to create images of your heart. It provides your doctor with information about the size and shape of your heart and how well your heart's chambers and valves are working. This procedure takes approximately one hour. There are no restrictions for this procedure.  Your physician has requested that you have a carotid duplex. This test is an ultrasound of the carotid arteries in your neck. It looks at blood flow through these arteries that supply the brain with blood. Allow one hour for this exam. There are no restrictions or special instructions.   Follow-Up: At [facility], you and your health needs are our priority.  As part of our continuing mission to provide you with exceptional heart care, we have created designated Provider Care Teams.  These Care Teams include your primary Cardiologist (physician) and Advanced Practice Providers (APPs -  Physician Assistants and Nurse Practitioners) who all work together to provide you with the care you need, when you need  it.  We recommend signing up for the patient portal called "MyChart".  Sign up information is provided on this  After Visit Summary.  MyChart is used to connect with patients for Virtual Visits (Telemedicine).  Patients are able to view lab/test results, encounter notes, upcoming appointments, etc.  Non-urgent messages can be sent to your provider as well.   To learn more about what you can do with MyChart, go to [email or web address].    Follow up as needed based on results of testing.   Other Instructions You have been referred to Electrophysiology - please schedule an appointment with either Dr. Lovena Le or Dr. Rayann Heman      Signed, Fransico Him, MD  08/31/2019 10:38 AM    [address], [place], [place]  [address] Phone: [phone]; Fax: [phone]

## 2019-08-31 NOTE — Patient Instructions (Signed)
Medication Instructions:  Your physician recommends that you continue on your current medications as directed. Please refer to the Current Medication list given to you today.  *If you need a refill on your cardiac medications before your next appointment, please call your pharmacy*   Testing/Procedures: Your physician has requested that you have an echocardiogram. Echocardiography is a painless test that uses sound waves to create images of your heart. It provides your doctor with information about the size and shape of your heart and how well your heart's chambers and valves are working. This procedure takes approximately one hour. There are no restrictions for this procedure.  Your physician has requested that you have a carotid duplex. This test is an ultrasound of the carotid arteries in your neck. It looks at blood flow through these arteries that supply the brain with blood. Allow one hour for this exam. There are no restrictions or special instructions.   Follow-Up: At [facility], you and your health needs are our priority.  As part of our continuing mission to provide you with exceptional heart care, we have created designated Provider Care Teams.  These Care Teams include your primary Cardiologist (physician) and Advanced Practice Providers (APPs -  Physician Assistants and Nurse Practitioners) who all work together to provide you with the care you need, when you need it.  We recommend signing up for the patient portal called "MyChart".  Sign up information is provided on this After Visit Summary.  MyChart is used to connect with patients for Virtual Visits (Telemedicine).  Patients are able to view lab/test results, encounter notes, upcoming appointments, etc.  Non-urgent messages can be sent to your provider as well.   To learn more about what you can do with MyChart, go to [email or web address].    Follow up as needed based on results of testing.   Other Instructions You have  been referred to Electrophysiology - please schedule an appointment with either Dr. Lovena Le or Dr. Rayann Heman

## 2019-09-19 ENCOUNTER — Other Ambulatory Visit: Payer: Self-pay

## 2019-09-19 ENCOUNTER — Encounter: Payer: Self-pay | Admitting: Cardiology

## 2019-09-19 ENCOUNTER — Encounter (INDEPENDENT_AMBULATORY_CARE_PROVIDER_SITE_OTHER): Payer: Self-pay

## 2019-09-19 ENCOUNTER — Ambulatory Visit (HOSPITAL_COMMUNITY)
Admission: RE | Admit: 2019-09-19 | Discharge: 2019-09-19 | Disposition: A | Discharge status: Home / Self Care | Payer: Medicare PPO | Source: Ambulatory Visit | Attending: Cardiovascular Disease | Admitting: Cardiovascular Disease

## 2019-09-19 ENCOUNTER — Ambulatory Visit (HOSPITAL_BASED_OUTPATIENT_CLINIC_OR_DEPARTMENT_OTHER): Payer: Medicare PPO

## 2019-09-19 DIAGNOSIS — R55 Syncope and collapse: Secondary | ICD-10-CM | POA: Diagnosis not present

## 2019-09-19 DIAGNOSIS — I6529 Occlusion and stenosis of unspecified carotid artery: Secondary | ICD-10-CM | POA: Insufficient documentation

## 2019-09-19 DIAGNOSIS — I351 Nonrheumatic aortic (valve) insufficiency: Secondary | ICD-10-CM | POA: Insufficient documentation

## 2019-09-20 ENCOUNTER — Encounter: Payer: Self-pay | Admitting: Internal Medicine

## 2019-09-20 ENCOUNTER — Encounter: Payer: Self-pay | Admitting: *Deleted

## 2019-09-20 ENCOUNTER — Telehealth: Payer: Self-pay | Admitting: *Deleted

## 2019-09-20 ENCOUNTER — Ambulatory Visit (INDEPENDENT_AMBULATORY_CARE_PROVIDER_SITE_OTHER): Payer: Medicare PPO | Admitting: Internal Medicine

## 2019-09-20 ENCOUNTER — Telehealth: Payer: Self-pay

## 2019-09-20 DIAGNOSIS — I6523 Occlusion and stenosis of bilateral carotid arteries: Secondary | ICD-10-CM

## 2019-09-20 DIAGNOSIS — R55 Syncope and collapse: Secondary | ICD-10-CM

## 2019-09-20 NOTE — Telephone Encounter (Signed)
-----   Message from [name], MD sent at 09/19/2019  7:16 PM EDT ----- 1-39% bilateral carotid stenosis - please repeat dopplers in 2 years.  Continue ASA and statin

## 2019-09-20 NOTE — Telephone Encounter (Signed)
Patient enrolled for [employer] to ship a 30 day cardiac event monitor to the patients home.  Instructions to be mailed to patients home and will also be included in his monitor kit.

## 2019-09-20 NOTE — Telephone Encounter (Signed)
The patient's wife has been notified of the result and verbalized understanding.  All questions (if any) were answered. Antonieta Iba, RN 09/20/2019 3:27 PM

## 2019-09-20 NOTE — Progress Notes (Signed)
HPI [name] is referred today by Dr. Radford Pax for evaluation of syncope. He is a pleasant 79 yo man with a h/o HTN, diabetes and dyslipidemia. He has had 2 episodes of frank syncope. On while in the bathroom and another during a [place] funeral service. His wife witnessed both. He was standing with both. He had no warning with either and did not lose control of bowel or bladder function. The patient was seen in the ED. He has preserved LV function by echo with no significant valve problems. He has no obstructive carotid disease by ultrasound. He has not had angina. He did not feel particularly badly after the spells and his wife noted that he was out for less than 30 seconds with each episode. He does have some dementia and has been treated with aricept. No Known Allergies   Current Outpatient Medications  Medication Sig Dispense Refill  . amLODipine (NORVASC) 10 MG tablet Take 10 mg by mouth every evening.     Marland Kitchen aspirin EC 81 MG tablet Take 81 mg by mouth daily.    Marland Kitchen donepezil (ARICEPT) 10 MG tablet Take 10 mg by mouth at bedtime.    Marland Kitchen HUMALOG KWIKPEN 100 UNIT/ML KwikPen Inject 2 Units into the skin as needed. Only if blood sugar is 30 or more    . labetalol (NORMODYNE) 200 MG tablet Take 200 mg by mouth 2 (two) times daily.  7  . levothyroxine (SYNTHROID, LEVOTHROID) 25 MCG tablet Take 25 mcg by mouth daily before breakfast.    . lisinopril-hydrochlorothiazide (PRINZIDE,ZESTORETIC) 20-12.5 MG per tablet Take 1 tablet by mouth 2 (two) times daily.     . Multiple Vitamin (MULTIVITAMIN WITH MINERALS) TABS tablet Take 1 tablet by mouth daily.    . Multiple Vitamins-Minerals (PRESERVISION AREDS 2 PO) Take 1 capsule by mouth 2 (two) times daily.    . rosuvastatin (CRESTOR) 20 MG tablet Take 20 mg by mouth daily.    [name] FLEXTOUCH 100 UNIT/ML FlexTouch Pen Inject 35 Units into the skin daily.      No current facility-administered medications for this visit.     Past Medical  History:  Diagnosis Date  . Aortic regurgitation    mild by echo 08/2019  . Carotid artery stenosis    1-39% bilateral stenosis by dopplers 08/2019  . Cataract   . Complication of anesthesia    "high blood sugar; he's been loopy last 36h since OR"- 2016  . Diabetes mellitus   . DKA, type 2 ([place])   . Hearing loss   . Hypercholesteremia   . Hypertension   . Insulin pump in place   . Macular degeneration   . MCI (mild cognitive impairment) with memory loss 12/12/2014  . OSA on CPAP 09/11/2014  . PONV (postoperative nausea and vomiting)   . Uses hearing aid    right ear, patient stated he lost left hearing aid     ROS:   All systems reviewed and negative except as noted in the HPI.   Past Surgical History:  Procedure Laterality Date  . CHOLECYSTECTOMY N/A 10/28/2015   Procedure: LAPAROSCOPIC CHOLECYSTECTOMY WITH   INTRAOPERATIVE CHOLANGIOGRAM;  Surgeon: Greer Pickerel, MD;  Location: [facility];  Service: General;  Laterality: N/A;  . SHOULDER ARTHROSCOPY Right 08/24/11   "frozen shoulder; RCR w/labrum tear; arthritis scraping; bone spurs"  . SHOULDER ARTHROSCOPY Left ~ 2006  . TONSILLECTOMY  1949   "in childhood"     Family History  Problem Relation  Age of Onset  . Heart disease Father   . High blood pressure Father   . Colon cancer Neg Hx      Social History   Socioeconomic History  . Marital status: Married    Spouse name: [name]   . Number of children: 2  . Years of education: masters  . Highest education level: Not on file  Occupational History    Comment: Retired.  Tobacco Use  . Smoking status: Former Smoker    Packs/day: 0.25    Years: 15.00    Pack years: 3.75    Types: Cigarettes    Quit date: 06/23/1983    Years since quitting: 36.2  . Smokeless tobacco: Never Used  Substance and Sexual Activity  . Alcohol use: No    Alcohol/week: 0.0 standard drinks  . Drug use: No  . Sexual activity: Yes  Other Topics Concern  . Not on file  Social History Narrative    Patient lives at home with his wife [name]).   Retired.   Education Master's degree.   Right handed.   Caffeine three cups of coffee daily.   Social Determinants of Health   Financial Resource Strain:   . Difficulty of Paying Living Expenses:   Food Insecurity:   . Worried About Charity fundraiser in the Last Year:   . Arboriculturist in the Last Year:   Transportation Needs:   . Film/video editor (Medical):   Marland Kitchen Lack of Transportation (Non-Medical):   Physical Activity:   . Days of Exercise per Week:   . Minutes of Exercise per Session:   Stress:   . Feeling of Stress :   Social Connections:   . Frequency of Communication with Friends and Family:   . Frequency of Social Gatherings with Friends and Family:   . Attends Religious Services:   . Active Member of Clubs or Organizations:   . Attends Archivist Meetings:   Marland Kitchen Marital Status:   Intimate Partner Violence:   . Fear of Current or Ex-Partner:   . Emotionally Abused:   Marland Kitchen Physically Abused:   . Sexually Abused:      BP 116/62   Pulse 61   Ht 5\' 6"  (1.676 m)   Wt 176 lb (79.8 kg)   SpO2 93%   BMI 28.41 kg/m   Physical Exam:  Well appearing NAD HEENT: Unremarkable Neck:  No JVD, no thyromegally Lymphatics:  No adenopathy Back:  No CVA tenderness Lungs:  Clear with no wheezes HEART:  Regular rate rhythm, no murmurs, no rubs, no clicks Abd:  soft, positive bowel sounds, no organomegally, no rebound, no guarding Ext:  2 plus pulses, no edema, no cyanosis, no clubbing Skin:  No rashes no nodules Neuro:  CN II through XII intact, motor grossly intact  EKG - multiple ECG's reviewed demonstrating low atrial voltage but probably NSR. His QRS is narrow.   Assess/Plan: 1. Syncope - the etiology is unclear. I have recommended he wear a 30 day monitor. If he has pauses then a PPM would be placed. If no pauses then ILR will be recommended as his history really sounds like arrhythmic syncope.  2. HTN  - he is on medical meds. He could have some orthostatic symptoms this was not observed previously.   Derrick Bosworth.D.

## 2019-09-20 NOTE — Patient Instructions (Addendum)
Medication Instructions:  Your physician recommends that you continue on your current medications as directed. Please refer to the Current Medication list given to you today.  Labwork: None ordered.  Testing/Procedures: Your physician has recommended that you wear a holter monitor. Holter monitors are medical devices that record the heart's electrical activity. Doctors most often use these monitors to diagnose arrhythmias. Arrhythmias are problems with the speed or rhythm of the heartbeat. The monitor is a small, portable device. You can wear one while you do your normal daily activities. This is usually used to diagnose what is causing palpitations/syncope (passing out).  You are going to wear a heart monitor for 30 days.  Follow-Up: Your physician wants you to follow-up in: based on results of your heart monitor.  Any Other Special Instructions Will Be Listed Below (If Applicable).  If you need a refill on your cardiac medications before your next appointment, please call your pharmacy.   ZIO XT- Long Term Monitor Instructions   Your physician has requested you wear your ZIO patch monitor__30__days.   This is a single patch monitor.  [employer] supplies one patch monitor per enrollment.  Additional stickers are not available.   Please do not apply patch if you will be having a Nuclear Stress Test, Echocardiogram, Cardiac CT, MRI, or Chest Xray during the time frame you would be wearing the monitor. The patch cannot be worn during these tests.  You cannot remove and re-apply the ZIO XT patch monitor.   Your ZIO patch monitor will be sent USPS Priority mail from [facility] directly to your home address. The monitor may also be mailed to a PO BOX if home delivery is not available.   It may take 3-5 days to receive your monitor after you have been enrolled.   Once you have received you monitor, please review enclosed instructions.  Your monitor has already been registered assigning a  specific monitor serial # to you.   Applying the monitor   Shave hair from upper left chest.   Hold abrader disc by orange tab.  Rub abrader in 40 strokes over left upper chest as indicated in your monitor instructions.   Clean area with 4 enclosed alcohol pads .  Use all pads to assure are is cleaned thoroughly.  Let dry.   Apply patch as indicated in monitor instructions.  Patch will be place under collarbone on left side of chest with arrow pointing upward.   Rub patch adhesive wings for 2 minutes.Remove white label marked "1".  Remove white label marked "2".  Rub patch adhesive wings for 2 additional minutes.   While looking in a mirror, press and release button in center of patch.  A small green light will flash 3-4 times .  This will be your only indicator the monitor has been turned on.     Do not shower for the first 24 hours.  You may shower after the first 24 hours.   Press button if you feel a symptom. You will hear a small click.  Record Date, Time and Symptom in the Patient Log Book.   When you are ready to remove patch, follow instructions on last 2 pages of Patient Log Book.  Stick patch monitor onto last page of Patient Log Book.   Place Patient Log Book in [place] box.  Use locking tab on box and tape box closed securely.  The [employer] has [employer] on it.  Please place in mailbox as soon  as possible.  Your physician should have your test results approximately 7 days after the monitor has been mailed back to [facility].   Call [place] at [phone] if you have questions regarding your ZIO XT patch monitor.  Call them immediately if you see an orange light blinking on your monitor.   If your monitor falls off in less than 4 days contact our Monitor department at [phone].  If your monitor becomes loose or falls off after 4 days call [employer] at [phone] for suggestions on securing your monitor.

## 2019-09-29 DIAGNOSIS — G4733 Obstructive sleep apnea (adult) (pediatric): Secondary | ICD-10-CM | POA: Diagnosis not present

## 2019-10-02 ENCOUNTER — Telehealth: Payer: Self-pay | Admitting: Internal Medicine

## 2019-10-02 DIAGNOSIS — R55 Syncope and collapse: Secondary | ICD-10-CM

## 2019-10-02 NOTE — Telephone Encounter (Signed)
New message:   Patient wife calling stating that her husband dementia and the monitor he has he is very concussed on how to use it and his wife would like to know if he could wear a ZO. Please call patient wife.

## 2019-10-09 ENCOUNTER — Encounter: Payer: Self-pay | Admitting: Neurology

## 2019-10-11 ENCOUNTER — Other Ambulatory Visit: Payer: Self-pay

## 2019-10-11 ENCOUNTER — Ambulatory Visit (INDEPENDENT_AMBULATORY_CARE_PROVIDER_SITE_OTHER): Payer: Medicare PPO | Admitting: Neurology

## 2019-10-11 ENCOUNTER — Encounter: Payer: Self-pay | Admitting: Neurology

## 2019-10-11 ENCOUNTER — Encounter: Payer: Self-pay | Admitting: *Deleted

## 2019-10-11 VITALS — BP 122/88 | HR 64 | Temp 98.0°F | Ht 67.0 in | Wt 172.0 lb

## 2019-10-11 DIAGNOSIS — R55 Syncope and collapse: Secondary | ICD-10-CM | POA: Insufficient documentation

## 2019-10-11 DIAGNOSIS — R0602 Shortness of breath: Secondary | ICD-10-CM | POA: Diagnosis not present

## 2019-10-11 DIAGNOSIS — I6521 Occlusion and stenosis of right carotid artery: Secondary | ICD-10-CM

## 2019-10-11 DIAGNOSIS — I351 Nonrheumatic aortic (valve) insufficiency: Secondary | ICD-10-CM

## 2019-10-11 DIAGNOSIS — G4733 Obstructive sleep apnea (adult) (pediatric): Secondary | ICD-10-CM | POA: Diagnosis not present

## 2019-10-11 DIAGNOSIS — Z9989 Dependence on other enabling machines and devices: Secondary | ICD-10-CM

## 2019-10-11 NOTE — Telephone Encounter (Signed)
Spoke with family.  30 day prevent[employer] monitor was too complicated for Pt and family.  Pt has dementia.  Per Dr. Lovena Le ok to order 2 week live zio.

## 2019-10-11 NOTE — Addendum Note (Signed)
Addended by: Willeen Cass A on: 10/11/2019 07:43 AM   Modules accepted: Orders

## 2019-10-11 NOTE — Patient Instructions (Signed)

## 2019-10-11 NOTE — Progress Notes (Signed)
SLEEP MEDICINE CLINIC   Provider:  Larey [name], M D  Referring Provider: Burnard Bunting, MD Primary Care Physician:  Derrick Bunting, MD  Chief Complaint  Patient presents with  . Follow-up    pt alone, rm 11. presents today for cpap compliance. DME [employer]. machine is working well. no issues or concerns     Derrick [name] is a 79 y.o. male , seen here in a RV as a sleep patient on CPAP. The patient was last tested for sleep apnea in May 2019 in a home sleep study.  At the time it was confirmed that he still has apnea his apnea index however was indicating a mild form his AHI was 9.5/h and his snoring was moderate.  The patient started on an auto CPAP soon after and has been a compliant user ever since.  This months compliance is again 100% by days and by time of use he is using Ultracet with a minimum pressure supply at 5 the maximum setting of 15 cmH2O with 3 cm EPR the 95th percentile pressure is 10.1 the residual AHI is 4.4 and he has about 9 minutes of the whole night in Cheyne-Stokes respirations.  So his residual apnea index is truly divided between central and obstructive apneas.  Increasing the pressure may help to reduce the obstructive apneas even through the but it also could cause an exacerbation of the central apnea component.  I am happy with his current settings and he has only a mild air leakage so I do not think he needs a different kind of mask to be fitted for.  He also endorsed a very moderate degree of sleepiness on the Epworth sleepiness score today at 6 out of 24 points. He reports that he sleeps through the night, with one nocturia break.   He wore hearing aids- not today- and he is very hard of hearing. He has a progressive cognitive decline , reports his wife. The couple lives at friends [facility] [name].   He had a syncope with normal neurological evaluation- has been waiting for a cardiac monitor.     HPI: The patient was originally seen upon as a  referral from Dr. Reynaldo [name] for a re evaluation of sleep apnea and CPAP treatment.  I have seen this patient last time 3 years ago in an office visit.  Originally referred by Dr. Reynaldo [name] then,  this patient came to me initially because his wife was concerned about his snoring and occasional pausing in his breath. The patient had reported that he had gained weight prior to being finally evaluated by the sleep study he had a history of hypertension, hearing loss, diabetes and a remote tobacco use history of about 40 pack years. His he had also reported nocturia at the time. The patient's sleep study took place on 1-30 06-2010. And revealed an AHI that off 24.1 per hour of sleep. He also had 11 central apneas but the vast majority were 118 obstructive apneas. There was not much of a positional component to his sleep but in rem sleep his AHI exacerbated 258.9 the patient's lowest oxygen saturation was 76% was 21 minutes of desaturation time and Toprol, returned for a CPAP titration and was titrated to 12 cm water. In his first download he had a 90% reduction of apneas and reached an AHI of 2.5. Also he was happy with the CPAP results he was initially not liking it too much. Patient is an insulin pump where a and had also  reported that he had taken Celebrex when I last saw him in 2012 and he was concerned that some of his lower extremity edema that he presented with may have been Celebrex related. [name] tells me today that he has used his CPAP machine and his current Epworth sleepiness score is endorsed at 10 points and his fatigue severity at 18 points. He does have a higher blood pressure and in his review of systems endorsed snoring hearing loss, erectile dysfunction and aching muscles, also bilateral shoulder injuries rotator cuff injuries.  12-12-14  Rv for this patient with OSA on a new CPAP. The patient reports today that he is very happy with this CPAP machine he is using it 100% of the time a download confirms  30 per 30 days out of 30 days of use 8 hours and 53 minutes on average 100% compliance. He is using an O2 sat between 5 and 15 cm water with 3 cm EPR. The 91st percentile of pressure is 11.6 cm water for this reason I would like to release the patient on an auto set machine instead of deciding on a set pressure.  His residual AHI is 4.0 which is sufficient and actually the patient is satisfied with the machine and use. He also comments that his spouse appreciates the very quiet character of the machine. He is using a large nasal pillow, has kept his sleep habits.  The patient reports that he has to blow his nose in the morning and that he has quite a bit of mucus discharge. This is likely related to the humidifier settings. As long as his nose is not obstructed or congested I have no problem with him using the current settings. His Epworth sleepiness score is 5 points  and his fatigue severity score 15 points -  very good.  07-23-15, here for his regular yearly compliance visit with CPAP. -over the last 30 days he used his machine 100% of the time and over 4 hours 400% of the time. Average user time 9 hours and 23 minutes. AutoSet between 5 and 15 cm water with 3 cm EPR, 91st percentile 10.6 cm water. AHI 2.5. No changes or new settings are necessary. Fatigue severity score was endorsed at 25 total score for the Epworth Sleepiness Scale was 2 points.  His wife and daughter have voiced concerns about his cognition, the couple moved in12-2016 to [facility]. He started Aricept at 5 mg daily just this month.   I recommended melatonin for PRN insomnia. He uses aleve pm.   Interval history from 11/04/2016, Derrick [name] presents today after 2 hospitalizations for diabetes ketoacidosis. Last May at this time he was in hospital for a cholecystectomy. He has had no trouble with his abdominal function since. However his diabetes has been difficult to control at times. He has lost 30 pounds since, he resides  with his wife at friends home [employer] and keeps his diet by controlling his overall portion size but also the choice of food he makes. He has 100% compliance for his AutoSet CPAP which covers a range between 5 and 15 cm water pressure with an EPR level of 3 cm water, AHI is 2.4 excellent resolution. Also my patient lost a significant amount of weight is 91st percentile pressure is still covered by the current settings and is now at 9.3 cm water. He has only a few central apneas and I did do not see a reason to restrict his window of pressure. He  reports hand pain, likely carpal tunnel syndrome.       Review of Systems: Out of a complete 14 system review, the patient complains of only the following symptoms, and all other reviewed systems are negative.  Cognitive decline. Has to look for words. Hard of hearing .    In  his review of systems endorsed snoring hearing loss, erectile dysfunction and aching muscles, also bilateral shoulder injuries rotator cuff injuries. Left hand pain - carpal tunnel.  Marland Kitchen  Epworth score  6 , Fatigue severity score   35/ 63   , depression score  0/15   Social History   Socioeconomic History  . Marital status: Married    Spouse name: [name]   . Number of children: 2  . Years of education: masters  . Highest education level: Not on file  Occupational History    Comment: Retired.  Tobacco Use  . Smoking status: Former Smoker    Packs/day: 0.25    Years: 15.00    Pack years: 3.75    Types: Cigarettes    Quit date: 06/23/1983    Years since quitting: 36.3  . Smokeless tobacco: Never Used  Substance and Sexual Activity  . Alcohol use: No    Alcohol/week: 0.0 standard drinks  . Drug use: No  . Sexual activity: Yes  Other Topics Concern  . Not on file  Social History Narrative   Patient lives at home with his wife [name]).   Retired.   Education Master's degree.   Right handed.   Caffeine three cups of coffee daily.   Social Determinants of  Health   Financial Resource Strain:   . Difficulty of Paying Living Expenses:   Food Insecurity:   . Worried About Charity fundraiser in the Last Year:   . Arboriculturist in the Last Year:   Transportation Needs:   . Film/video editor (Medical):   Marland Kitchen Lack of Transportation (Non-Medical):   Physical Activity:   . Days of Exercise per Week:   . Minutes of Exercise per Session:   Stress:   . Feeling of Stress :   Social Connections:   . Frequency of Communication with Friends and Family:   . Frequency of Social Gatherings with Friends and Family:   . Attends Religious Services:   . Active Member of Clubs or Organizations:   . Attends Archivist Meetings:   Marland Kitchen Marital Status:   Intimate Partner Violence:   . Fear of Current or Ex-Partner:   . Emotionally Abused:   Marland Kitchen Physically Abused:   . Sexually Abused:     Family History  Problem Relation Age of Onset  . Heart disease Father   . High blood pressure Father   . Colon cancer Neg Hx     Past Medical History:  Diagnosis Date  . Aortic regurgitation    mild by echo 08/2019  . Carotid artery stenosis    1-39% bilateral stenosis by dopplers 08/2019  . Cataract   . Complication of anesthesia    "high blood sugar; he's been loopy last 36h since OR"- 2016  . Diabetes mellitus   . DKA, type 2 ([place])   . Hearing loss   . Hypercholesteremia   . Hypertension   . Insulin pump in place   . Macular degeneration   . MCI (mild cognitive impairment) with memory loss 12/12/2014  . OSA on CPAP 09/11/2014  . PONV (postoperative nausea and vomiting)   . Uses  hearing aid    right ear, patient stated he lost left hearing aid     Past Surgical History:  Procedure Laterality Date  . CHOLECYSTECTOMY N/A 10/28/2015   Procedure: LAPAROSCOPIC CHOLECYSTECTOMY WITH   INTRAOPERATIVE CHOLANGIOGRAM;  Surgeon: Greer Pickerel, MD;  Location: [facility];  Service: General;  Laterality: N/A;  . SHOULDER ARTHROSCOPY Right 08/24/11   "frozen  shoulder; RCR w/labrum tear; arthritis scraping; bone spurs"  . SHOULDER ARTHROSCOPY Left ~ 2006  . TONSILLECTOMY  1949   "in childhood"    Current Outpatient Medications  Medication Sig Dispense Refill  . amLODipine (NORVASC) 10 MG tablet Take 10 mg by mouth every evening.     Marland Kitchen aspirin EC 81 MG tablet Take 81 mg by mouth daily.    Marland Kitchen donepezil (ARICEPT) 10 MG tablet Take 10 mg by mouth at bedtime.    Marland Kitchen HUMALOG KWIKPEN 100 UNIT/ML KwikPen Inject 2 Units into the skin as needed. Only if blood sugar is 300 or more before a meal    . labetalol (NORMODYNE) 200 MG tablet Take 200 mg by mouth 2 (two) times daily.  7  . lisinopril-hydrochlorothiazide (PRINZIDE,ZESTORETIC) 20-12.5 MG per tablet Take 1 tablet by mouth 2 (two) times daily.     . Multiple Vitamin (MULTIVITAMIN WITH MINERALS) TABS tablet Take 1 tablet by mouth daily.    . Multiple Vitamins-Minerals (PRESERVISION AREDS 2 PO) Take 1 capsule by mouth 2 (two) times daily.    . rosuvastatin (CRESTOR) 20 MG tablet Take 20 mg by mouth daily.    [name] FLEXTOUCH 100 UNIT/ML FlexTouch Pen Inject 36 Units into the skin daily.      No current facility-administered medications for this visit.    Allergies as of 10/11/2019  . (No Known Allergies)    Vitals: BP 122/88   Pulse 64   Temp 98 F (36.7 C)   Ht 5\' 7"  (1.702 m)   Wt 172 lb (78 kg)   BMI 26.94 kg/m  Last Weight:  Wt Readings from Last 1 Encounters:  10/11/19 172 lb (78 kg)       Last Height:   Ht Readings from Last 1 Encounters:  10/11/19 5\' 7"  (1.702 m)    Physical exam:  General: The patient is awake, alert and appears not in acute distress. The patient is well groomed. Head: Normocephalic, atraumatic. Neck is supple. Mallampati 3,  neck circumference: 18 . Nasal airflow unrestricted , TMJ is not evident. Retrognathia is mild .  Cardiovascular:  Regular rate and rhythm, without murmurs or carotid bruit, and without distended neck veins. Respiratory: Lungs are  clear to auscultation. Skin:  Without evidence of edema, or rash Trunk: BMI is elevated and patient  has normal posture.  Neurologic exam : The patient is awake and alert, oriented to place and time.   Memory subjective  described as intact.  There is a normal attention span & concentration ability. Speech is non-fluent without dysarthria, dysphonia or aphasia. He just has to raise his voice.    Mood and affect are appropriate.  Cranial nerves: Pupils are equal and extraocular movements  in vertical and horizontal planes intact and without nystagmus. Visual fields by finger perimetry are intact. Hearing to finger rub right more impaired than left. Facial sensation intact to fine touch.  Facial motor strength is symmetric and tongue and uvula move midline.  Motor exam: thenar eminence atrophy left, loss of grip strength.  Deep tendon reflexes: in the  upper and lower extremities are  attenauted, symmetric and intact.  Babinski maneuver response was deferred.   Assessment:  After physical and neurologic examination, review of laboratory studies, imaging, neurophysiology testing and pre-existing records, assessment is   1)OSA needs still covered by autoset CPAP.   The CPAPs auto setting is between 5 cm and 15 cm maximum pressure with 3 cm pressure relief.  The residual AHI is satisfying at 4.4 .  I would like for him to continue using an AutoSet machine, I will [facility] a [facility] of compliance  [facility]  2) painful carpal tunnel syndrome on the left  Clearly atrophy of the thenar eminence- lack of grip strength and pinch strengths. Positive Tinel sign.   3) 2 admissions for diabetic ketoacidosis, Lost significant weight.  BMI is 26- he is able to maintain.      Sincerely,  Derrick [name] M.D.   Derrick Partridge Joleena Weisenburger MD  10/11/2019

## 2019-10-21 ENCOUNTER — Other Ambulatory Visit (INDEPENDENT_AMBULATORY_CARE_PROVIDER_SITE_OTHER): Payer: Medicare PPO

## 2019-10-21 DIAGNOSIS — R55 Syncope and collapse: Secondary | ICD-10-CM | POA: Diagnosis not present

## 2019-10-22 DIAGNOSIS — R55 Syncope and collapse: Secondary | ICD-10-CM | POA: Diagnosis not present

## 2019-11-13 DIAGNOSIS — E7849 Other hyperlipidemia: Secondary | ICD-10-CM | POA: Diagnosis not present

## 2019-11-13 DIAGNOSIS — Z0000 Encounter for general adult medical examination without abnormal findings: Secondary | ICD-10-CM | POA: Diagnosis not present

## 2019-11-13 DIAGNOSIS — E1149 Type 2 diabetes mellitus with other diabetic neurological complication: Secondary | ICD-10-CM | POA: Diagnosis not present

## 2019-11-13 DIAGNOSIS — Z125 Encounter for screening for malignant neoplasm of prostate: Secondary | ICD-10-CM | POA: Diagnosis not present

## 2019-11-13 DIAGNOSIS — E038 Other specified hypothyroidism: Secondary | ICD-10-CM | POA: Diagnosis not present

## 2019-11-17 DIAGNOSIS — I129 Hypertensive chronic kidney disease with stage 1 through stage 4 chronic kidney disease, or unspecified chronic kidney disease: Secondary | ICD-10-CM | POA: Diagnosis not present

## 2019-11-17 DIAGNOSIS — E7849 Other hyperlipidemia: Secondary | ICD-10-CM | POA: Diagnosis not present

## 2019-11-17 DIAGNOSIS — N182 Chronic kidney disease, stage 2 (mild): Secondary | ICD-10-CM | POA: Diagnosis not present

## 2019-11-17 DIAGNOSIS — E1129 Type 2 diabetes mellitus with other diabetic kidney complication: Secondary | ICD-10-CM | POA: Diagnosis not present

## 2019-11-17 DIAGNOSIS — E1149 Type 2 diabetes mellitus with other diabetic neurological complication: Secondary | ICD-10-CM | POA: Diagnosis not present

## 2019-11-17 DIAGNOSIS — F0390 Unspecified dementia without behavioral disturbance: Secondary | ICD-10-CM | POA: Diagnosis not present

## 2019-11-17 DIAGNOSIS — G4733 Obstructive sleep apnea (adult) (pediatric): Secondary | ICD-10-CM | POA: Diagnosis not present

## 2019-11-17 DIAGNOSIS — Z0000 Encounter for general adult medical examination without abnormal findings: Secondary | ICD-10-CM | POA: Diagnosis not present

## 2019-11-17 DIAGNOSIS — R82998 Other abnormal findings in urine: Secondary | ICD-10-CM | POA: Diagnosis not present

## 2019-11-17 DIAGNOSIS — Z794 Long term (current) use of insulin: Secondary | ICD-10-CM | POA: Diagnosis not present

## 2019-11-17 DIAGNOSIS — E038 Other specified hypothyroidism: Secondary | ICD-10-CM | POA: Diagnosis not present

## 2019-11-27 ENCOUNTER — Telehealth: Payer: Self-pay

## 2019-11-27 NOTE — Telephone Encounter (Signed)
-----   Message from Damian Leavell, RN sent at 09/21/2019  7:25 AM EDT ----- Regarding: 30 day monitor

## 2019-11-27 NOTE — Telephone Encounter (Signed)
Call placed to Pt.  Advised results of HM did not show any reason why Pt should have had syncopal episodes.  Per Dr. Lovena Le he recommends going forward with loop implant.   Will have scheduler call Pt to schedule.

## 2019-11-28 NOTE — Telephone Encounter (Signed)
Called about scheduling loop implant. Pt wife states they want to discuss with pt's PCP before moving forward.

## 2019-11-28 NOTE — Telephone Encounter (Signed)
Pt will call if decides to go ahead with loop

## 2019-11-29 ENCOUNTER — Telehealth: Payer: Self-pay | Admitting: Internal Medicine

## 2019-11-29 NOTE — Telephone Encounter (Signed)
Returned call to wife.  Answered questions regarding loop implant.  Pt's wife will call [facility] tomorrow to schedule.

## 2019-11-29 NOTE — Telephone Encounter (Signed)
Pt wife called and would like to speak to nurse about the pts health issues and having a loop implanted

## 2020-01-03 ENCOUNTER — Other Ambulatory Visit: Payer: Self-pay

## 2020-01-03 ENCOUNTER — Encounter: Payer: Self-pay | Admitting: Internal Medicine

## 2020-01-03 ENCOUNTER — Ambulatory Visit (INDEPENDENT_AMBULATORY_CARE_PROVIDER_SITE_OTHER): Payer: Medicare PPO | Admitting: Internal Medicine

## 2020-01-03 VITALS — BP 124/60 | HR 60 | Ht 67.0 in | Wt 179.0 lb

## 2020-01-03 DIAGNOSIS — R55 Syncope and collapse: Secondary | ICD-10-CM | POA: Diagnosis not present

## 2020-01-03 DIAGNOSIS — G4733 Obstructive sleep apnea (adult) (pediatric): Secondary | ICD-10-CM | POA: Diagnosis not present

## 2020-01-03 NOTE — Progress Notes (Signed)
HPI Mr. Sawin returns today for ongoing evaluation of unexplained syncope. I saw him 3 months ago for the above problem. He has not had more syncope. He does have dementia. No additional symptoms. He denies chest pain or sob. He is fairly sedentary. He would like to proceed with ILR insertion.  No Known Allergies   Current Outpatient Medications  Medication Sig Dispense Refill  . amLODipine (NORVASC) 10 MG tablet Take 10 mg by mouth every evening.     Marland Kitchen aspirin EC 81 MG tablet Take 81 mg by mouth daily.    Marland Kitchen donepezil (ARICEPT) 10 MG tablet Take 10 mg by mouth at bedtime.    Marland Kitchen HUMALOG KWIKPEN 100 UNIT/ML KwikPen Inject 2 Units into the skin as needed. Only if blood sugar is 300 or more before a meal    . labetalol (NORMODYNE) 200 MG tablet Take 200 mg by mouth 2 (two) times daily.  7  . lisinopril-hydrochlorothiazide (PRINZIDE,ZESTORETIC) 20-12.5 MG per tablet Take 1 tablet by mouth 2 (two) times daily.     . Multiple Vitamin (MULTIVITAMIN WITH MINERALS) TABS tablet Take 1 tablet by mouth daily.    . Multiple Vitamins-Minerals (PRESERVISION AREDS 2 PO) Take 1 capsule by mouth 2 (two) times daily.    . rosuvastatin (CRESTOR) 20 MG tablet Take 20 mg by mouth daily.    [name] FLEXTOUCH 100 UNIT/ML FlexTouch Pen Inject 36 Units into the skin daily.     . sertraline (ZOLOFT) 25 MG tablet Take 1 tablet by mouth daily.     No current facility-administered medications for this visit.     Past Medical History:  Diagnosis Date  . Aortic regurgitation    mild by echo 08/2019  . Carotid artery stenosis    1-39% bilateral stenosis by dopplers 08/2019  . Cataract   . Complication of anesthesia    "high blood sugar; he's been loopy last 36h since OR"- 2016  . Diabetes mellitus   . DKA, type 2 ([place])   . Hearing loss   . Hypercholesteremia   . Hypertension   . Insulin pump in place   . Macular degeneration   . MCI (mild cognitive impairment) with memory loss 12/12/2014  . OSA on  CPAP 09/11/2014  . PONV (postoperative nausea and vomiting)   . Uses hearing aid    right ear, patient stated he lost left hearing aid     ROS:   All systems reviewed and negative except as noted in the HPI.   Past Surgical History:  Procedure Laterality Date  . CHOLECYSTECTOMY N/A 10/28/2015   Procedure: LAPAROSCOPIC CHOLECYSTECTOMY WITH   INTRAOPERATIVE CHOLANGIOGRAM;  Surgeon: Greer Pickerel, MD;  Location: [facility];  Service: General;  Laterality: N/A;  . SHOULDER ARTHROSCOPY Right 08/24/11   "frozen shoulder; RCR w/labrum tear; arthritis scraping; bone spurs"  . SHOULDER ARTHROSCOPY Left ~ 2006  . TONSILLECTOMY  1949   "in childhood"     Family History  Problem Relation Age of Onset  . Heart disease Father   . High blood pressure Father   . Colon cancer Neg Hx      Social History   Socioeconomic History  . Marital status: Married    Spouse name: [name]   . Number of children: 2  . Years of education: masters  . Highest education level: Not on file  Occupational History    Comment: Retired.  Tobacco Use  . Smoking status: Former Smoker    Packs/day: 0.25  Years: 15.00    Pack years: 3.75    Types: Cigarettes    Quit date: 06/23/1983    Years since quitting: 36.5  . Smokeless tobacco: Never Used  Substance and Sexual Activity  . Alcohol use: No    Alcohol/week: 0.0 standard drinks  . Drug use: No  . Sexual activity: Yes  Other Topics Concern  . Not on file  Social History Narrative   Patient lives at home with his wife [name]).   Retired.   Education Master's degree.   Right handed.   Caffeine three cups of coffee daily.   Social Determinants of Health   Financial Resource Strain:   . Difficulty of Paying Living Expenses:   Food Insecurity:   . Worried About Charity fundraiser in the Last Year:   . Arboriculturist in the Last Year:   Transportation Needs:   . Film/video editor (Medical):   Marland Kitchen Lack of Transportation (Non-Medical):   Physical  Activity:   . Days of Exercise per Week:   . Minutes of Exercise per Session:   Stress:   . Feeling of Stress :   Social Connections:   . Frequency of Communication with Friends and Family:   . Frequency of Social Gatherings with Friends and Family:   . Attends Religious Services:   . Active Member of Clubs or Organizations:   . Attends Archivist Meetings:   Marland Kitchen Marital Status:   Intimate Partner Violence:   . Fear of Current or Ex-Partner:   . Emotionally Abused:   Marland Kitchen Physically Abused:   . Sexually Abused:      BP 124/60   Pulse 60   Ht 5\' 7"  (1.702 m)   Wt 179 lb (81.2 kg)   SpO2 93%   BMI 28.04 kg/m   Physical Exam:  Well appearing 79 yo man, NAD HEENT: Unremarkable Neck:  6 cm JVD, no thyromegally Lymphatics:  No adenopathy Back:  No CVA tenderness Lungs:  Clear with no wheezes HEART:  Regular rate rhythm, no murmurs, no rubs, no clicks Abd:  soft, positive bowel sounds, no organomegally, no rebound, no guarding Ext:  2 plus pulses, no edema, no cyanosis, no clubbing Skin:  No rashes no nodules Neuro:  CN II through XII intact, motor grossly intact  EKG - none  Assess/Plan: 1. Unexplained syncope - I have discussed again the issues as to likely diagnosis. I have recommended ILR insertion and the indications/risks/benefits were reviewed and he wishes to proceed.  2. HTN - his bp is well controlled. No change in his meds.  Carleene Overlie Daisuke Bailey,M.D.  EP Procedure Note  Procedure Performed: ILR insertion  Preoperative diagnosis: unexplained syncope.   Postoperative diagnosis: unexplained syncope.  Description of the procedure: after informed consent was obtained, the patient was prepped and draped in a sterile fashion. A one cm stab incision was carried out over the left pectoral region. The Medtronic ILR, # [id number] S was inserted. The R waves measured 0.26mV. Benzoin and steristrips were painted on the skin. A bandage was applied. The patient was recovered  in the usual manner.   Complications: none immediately  Conclusion: successful ILR insertion without complication.  Mikle Bosworth.D.

## 2020-01-03 NOTE — Patient Instructions (Addendum)
Medication Instructions:  Your physician recommends that you continue on your current medications as directed. Please refer to the Current Medication list given to you today.  Labwork: None ordered.  Testing/Procedures: None ordered.  Follow-Up:  Your physician wants you to follow-up in: one year with Dr. Taylor.  You will receive a reminder letter in the mail two months in advance. If you don't receive a letter, please call our office to schedule the follow-up appointment.   Any Other Special Instructions Will Be Listed Below (If Applicable).  If you need a refill on your cardiac medications before your next appointment, please call your pharmacy.    Implantable Loop Recorder Placement, Care After This sheet gives you information about how to care for yourself after your procedure. Your health care provider may also give you more specific instructions. If you have problems or questions, contact your health care provider. What can I expect after the procedure? After the procedure, it is common to have:  Soreness or discomfort near the incision.  Some swelling or bruising near the incision. Follow these instructions at home: Incision care   Follow instructions from your health care provider about how to take care of your incision. Make sure you: ? Leave your outer dressing on for 72 hours.  After 72 hours you can remove that and shower. ? Leave adhesive strips in place. These skin closures may need to stay in place for 2 weeks or longer. If adhesive strip edges start to loosen and curl up, you may trim the loose edges. Do not remove adhesive strips completely unless your health care provider tells you to do that.  Check your incision area every day for signs of infection. Check for: ? Redness, swelling, or pain. ? Fluid or blood. ? Warmth. ? Pus or a bad smell. Do not take baths, swim, or use a hot tub until your incision is completely healed.  Activity  Return to your  normal activities.  General instructions  Follow instructions from your health care provider about how to manage your implantable loop recorder and transmit the information. Learn how to activate a recording if this is necessary for your type of device.  Do not go through a metal detection gate, and do not let someone hold a metal detector over your chest. Show your ID card.  Do not have an MRI unless you check with your health care provider first.  Take over-the-counter and prescription medicines only as told by your health care provider.  Keep all follow-up visits as told by your health care provider. This is important. Contact a health care provider if:  You have redness, swelling, or pain around your incision.  You have a fever.  You have pain that is not relieved by your pain medicine.  You have triggered your device because of fainting (syncope) or because of a heartbeat that feels like it is racing, slow, fluttering, or skipping (palpitations). Get help right away if you have:  Chest pain.  Difficulty breathing. Summary  After the procedure, it is common to have soreness or discomfort near the incision.  Change your dressing as told by your health care provider.  Follow instructions from your health care provider about how to manage your implantable loop recorder and transmit the information.  Keep all follow-up visits as told by your health care provider. This is important. This information is not intended to replace advice given to you by your health care provider. Make sure you discuss any questions you   have with your health care provider. Document Released: 05/20/2015 Document Revised: 07/24/2017 Document Reviewed: 07/24/2017 Elsevier Patient Education  2020 Elsevier Inc.   

## 2020-01-24 ENCOUNTER — Ambulatory Visit: Payer: Medicare PPO | Admitting: Internal Medicine

## 2020-02-02 DIAGNOSIS — G4733 Obstructive sleep apnea (adult) (pediatric): Secondary | ICD-10-CM | POA: Diagnosis not present

## 2020-02-05 ENCOUNTER — Ambulatory Visit (INDEPENDENT_AMBULATORY_CARE_PROVIDER_SITE_OTHER): Payer: Medicare PPO | Admitting: *Deleted

## 2020-02-05 DIAGNOSIS — R55 Syncope and collapse: Secondary | ICD-10-CM

## 2020-02-05 LAB — CUP PACEART REMOTE DEVICE CHECK
Date Time Interrogation Session: 20210816112919
Implantable Pulse Generator Implant Date: 20210714

## 2020-02-06 NOTE — Progress Notes (Signed)
Carelink Summary Report / Loop Recorder 

## 2020-03-01 ENCOUNTER — Ambulatory Visit (INDEPENDENT_AMBULATORY_CARE_PROVIDER_SITE_OTHER): Payer: Medicare PPO | Admitting: Internal Medicine

## 2020-03-01 ENCOUNTER — Encounter: Payer: Self-pay | Admitting: Internal Medicine

## 2020-03-01 VITALS — BP 102/58 | HR 65 | Ht 67.0 in | Wt 178.0 lb

## 2020-03-01 DIAGNOSIS — R194 Change in bowel habit: Secondary | ICD-10-CM

## 2020-03-01 DIAGNOSIS — Z86010 Personal history of colonic polyps: Secondary | ICD-10-CM | POA: Diagnosis not present

## 2020-03-01 NOTE — Progress Notes (Signed)
HISTORY OF PRESENT ILLNESS:  Derrick [name] is a 79 y.o. male, with multiple medical problems including diabetes mellitus, hypertension, hyperlipidemia, and dementia.  He is referred today by his primary care provider Dr. [name] regarding change in bowel habits.  The patient is accompanied by his wife who assists with the history (patient has memory issues).  He has been seen in this office previously for history of adenomatous colon polyps.  Prior colonoscopy performed 2003, 2009, and most recently 2015.  Most recent examination with moderate diverticulosis and a diminutive sessile serrated polyp.  Follow-up in 5 years to be considered.  The patient's wife reports a change in bowel habits approximately 2 months ago.  Specifically the color.  They contacted their primary care provider's office who recommended GI evaluation as the next step.  She states that the bowel movement has been orangey or copper color.  It is somewhat softer.  Occasionally he will have an explosive bowel movement.  There has been no bleeding, no abdominal pain, no weight loss, no stearrhea, and no incontinence.  No nocturnal symptoms.  He continues with the same frequency of 1 bowel movement per day.  There have been no changes in medications, diet, or over-the-counter remedies.  Patient's wife brings in a sample of the stool.  Blood work from August 28, 2019 shows unremarkable CBC with hemoglobin 14.5.  He is status post cholecystectomy.  REVIEW OF SYSTEMS:  All non-GI ROS negative unless otherwise stated in the HPI except for memory problems, hearing loss  Past Medical History:  Diagnosis Date  . Aortic regurgitation    mild by echo 08/2019  . Carotid artery stenosis    1-39% bilateral stenosis by dopplers 08/2019  . Cataract   . Complication of anesthesia    "high blood sugar; he's been loopy last 36h since OR"- 2016  . Diabetes mellitus   . DKA, type 2 ([place])   . Hearing loss   . Hypercholesteremia   . Hypertension    . Insulin pump in place   . Macular degeneration   . MCI (mild cognitive impairment) with memory loss 12/12/2014  . OSA on CPAP 09/11/2014  . PONV (postoperative nausea and vomiting)   . Uses hearing aid    right ear, patient stated he lost left hearing aid     Past Surgical History:  Procedure Laterality Date  . CHOLECYSTECTOMY N/A 10/28/2015   Procedure: LAPAROSCOPIC CHOLECYSTECTOMY WITH   INTRAOPERATIVE CHOLANGIOGRAM;  Surgeon: Greer Pickerel, MD;  Location: [facility];  Service: General;  Laterality: N/A;  . SHOULDER ARTHROSCOPY Right 08/24/11   "frozen shoulder; RCR w/labrum tear; arthritis scraping; bone spurs"  . SHOULDER ARTHROSCOPY Left ~ 2006  . TONSILLECTOMY  1949   "in childhood"    Social History [name]  reports that he quit smoking about 36 years ago. His smoking use included cigarettes. He has a 3.75 pack-year smoking history. He has never used smokeless tobacco. He reports that he does not drink alcohol and does not use drugs.  family history includes Diabetes in his daughter; Heart disease in his father; High blood pressure in his father.  No Known Allergies     PHYSICAL EXAMINATION: Vital signs: BP (!) 102/58   Pulse 65   Ht 5\' 7"  (1.702 m)   Wt 178 lb (80.7 kg)   BMI 27.88 kg/m   Constitutional: generally well-appearing, no acute distress Psychiatric: alert and oriented x3, cooperative Eyes: extraocular movements intact, anicteric, conjunctiva pink Mouth: oral pharynx moist,  no lesions Neck: supple no lymphadenopathy Cardiovascular: heart regular rate and rhythm, no murmur Lungs: clear to auscultation bilaterally Abdomen: soft, nontender, nondistended, no obvious ascites, no peritoneal signs, normal bowel sounds, no organomegaly Rectal: Omitted Extremities: no clubbing, cyanosis, or lower extremity edema bilaterally Skin: no lesions on visible extremities Neuro: No focal deficits.  Hearing aid present  STOOL SAMPLE.  Unremarkable soft light brown  stool  ASSESSMENT:  1.  Change in bowel habits.  Mild nonspecific without alarm features. 2.  History of adenomatous colon polyps and sessile serrated polyp.  Would be due for surveillance. 3.  General medical problems.  Stable   PLAN:  1.  Reassurance regarding change in bowel habits 2.  Offered surveillance colonoscopy.  They are not interested unless absolutely necessary.  I think holding off is reasonable given the results from his prior exams. 3.  Discussed that he could be reevaluated should alarm features develop. 4.  Return to the care of Dr. [name]

## 2020-03-01 NOTE — Patient Instructions (Signed)
Please follow up as needed 

## 2020-03-11 ENCOUNTER — Ambulatory Visit (INDEPENDENT_AMBULATORY_CARE_PROVIDER_SITE_OTHER): Payer: Medicare PPO | Admitting: *Deleted

## 2020-03-11 DIAGNOSIS — R55 Syncope and collapse: Secondary | ICD-10-CM

## 2020-03-12 LAB — CUP PACEART REMOTE DEVICE CHECK
Date Time Interrogation Session: 20210918113436
Implantable Pulse Generator Implant Date: 20210714

## 2020-03-13 NOTE — Progress Notes (Signed)
Carelink Summary Report / Loop Recorder 

## 2020-03-14 DIAGNOSIS — N182 Chronic kidney disease, stage 2 (mild): Secondary | ICD-10-CM | POA: Diagnosis not present

## 2020-03-14 DIAGNOSIS — E039 Hypothyroidism, unspecified: Secondary | ICD-10-CM | POA: Diagnosis not present

## 2020-03-14 DIAGNOSIS — E1149 Type 2 diabetes mellitus with other diabetic neurological complication: Secondary | ICD-10-CM | POA: Diagnosis not present

## 2020-03-14 DIAGNOSIS — I129 Hypertensive chronic kidney disease with stage 1 through stage 4 chronic kidney disease, or unspecified chronic kidney disease: Secondary | ICD-10-CM | POA: Diagnosis not present

## 2020-03-14 DIAGNOSIS — R5383 Other fatigue: Secondary | ICD-10-CM | POA: Diagnosis not present

## 2020-03-14 DIAGNOSIS — G4733 Obstructive sleep apnea (adult) (pediatric): Secondary | ICD-10-CM | POA: Diagnosis not present

## 2020-03-14 DIAGNOSIS — F0390 Unspecified dementia without behavioral disturbance: Secondary | ICD-10-CM | POA: Diagnosis not present

## 2020-03-14 DIAGNOSIS — F329 Major depressive disorder, single episode, unspecified: Secondary | ICD-10-CM | POA: Diagnosis not present

## 2020-04-15 ENCOUNTER — Ambulatory Visit (INDEPENDENT_AMBULATORY_CARE_PROVIDER_SITE_OTHER): Payer: Medicare PPO

## 2020-04-15 DIAGNOSIS — R55 Syncope and collapse: Secondary | ICD-10-CM | POA: Diagnosis not present

## 2020-04-17 NOTE — Progress Notes (Signed)
Carelink Summary Report / Loop Recorder 

## 2020-05-01 ENCOUNTER — Emergency Department (HOSPITAL_COMMUNITY): Payer: Medicare PPO

## 2020-05-01 ENCOUNTER — Emergency Department (HOSPITAL_COMMUNITY)
Admission: EM | Admit: 2020-05-01 | Discharge: 2020-05-01 | Disposition: A | Discharge status: Home / Self Care | Payer: Medicare PPO | Attending: Emergency Medicine | Admitting: Emergency Medicine

## 2020-05-01 DIAGNOSIS — Z794 Long term (current) use of insulin: Secondary | ICD-10-CM | POA: Diagnosis not present

## 2020-05-01 DIAGNOSIS — Z9641 Presence of insulin pump (external) (internal): Secondary | ICD-10-CM | POA: Insufficient documentation

## 2020-05-01 DIAGNOSIS — Z7982 Long term (current) use of aspirin: Secondary | ICD-10-CM | POA: Insufficient documentation

## 2020-05-01 DIAGNOSIS — R404 Transient alteration of awareness: Secondary | ICD-10-CM | POA: Diagnosis not present

## 2020-05-01 DIAGNOSIS — Y92009 Unspecified place in unspecified non-institutional (private) residence as the place of occurrence of the external cause: Secondary | ICD-10-CM | POA: Insufficient documentation

## 2020-05-01 DIAGNOSIS — I491 Atrial premature depolarization: Secondary | ICD-10-CM | POA: Diagnosis not present

## 2020-05-01 DIAGNOSIS — F0390 Unspecified dementia without behavioral disturbance: Secondary | ICD-10-CM | POA: Insufficient documentation

## 2020-05-01 DIAGNOSIS — I2510 Atherosclerotic heart disease of native coronary artery without angina pectoris: Secondary | ICD-10-CM | POA: Insufficient documentation

## 2020-05-01 DIAGNOSIS — W19XXXA Unspecified fall, initial encounter: Secondary | ICD-10-CM | POA: Insufficient documentation

## 2020-05-01 DIAGNOSIS — I10 Essential (primary) hypertension: Secondary | ICD-10-CM | POA: Diagnosis not present

## 2020-05-01 DIAGNOSIS — E876 Hypokalemia: Secondary | ICD-10-CM | POA: Diagnosis not present

## 2020-05-01 DIAGNOSIS — Z87891 Personal history of nicotine dependence: Secondary | ICD-10-CM | POA: Insufficient documentation

## 2020-05-01 DIAGNOSIS — R569 Unspecified convulsions: Secondary | ICD-10-CM | POA: Diagnosis not present

## 2020-05-01 DIAGNOSIS — R4020 Unspecified coma: Secondary | ICD-10-CM | POA: Diagnosis not present

## 2020-05-01 DIAGNOSIS — R55 Syncope and collapse: Secondary | ICD-10-CM | POA: Insufficient documentation

## 2020-05-01 DIAGNOSIS — Z79899 Other long term (current) drug therapy: Secondary | ICD-10-CM | POA: Insufficient documentation

## 2020-05-01 DIAGNOSIS — S0990XA Unspecified injury of head, initial encounter: Secondary | ICD-10-CM | POA: Diagnosis not present

## 2020-05-01 DIAGNOSIS — E119 Type 2 diabetes mellitus without complications: Secondary | ICD-10-CM | POA: Insufficient documentation

## 2020-05-01 LAB — CBC WITH DIFFERENTIAL/PLATELET
Abs Immature Granulocytes: 0.01 10*3/uL (ref 0.00–0.07)
Basophils Absolute: 0.1 10*3/uL (ref 0.0–0.1)
Basophils Relative: 1 %
Eosinophils Absolute: 0.1 10*3/uL (ref 0.0–0.5)
Eosinophils Relative: 1 %
HCT: 41.6 % (ref 39.0–52.0)
Hemoglobin: 14 g/dL (ref 13.0–17.0)
Immature Granulocytes: 0 %
Lymphocytes Relative: 9 %
Lymphs Abs: 0.6 10*3/uL — ABNORMAL LOW (ref 0.7–4.0)
MCH: 31.8 pg (ref 26.0–34.0)
MCHC: 33.7 g/dL (ref 30.0–36.0)
MCV: 94.5 fL (ref 80.0–100.0)
Monocytes Absolute: 0.7 10*3/uL (ref 0.1–1.0)
Monocytes Relative: 10 %
Neutro Abs: 5.4 10*3/uL (ref 1.7–7.7)
Neutrophils Relative %: 79 %
Platelets: 171 10*3/uL (ref 150–400)
RBC: 4.4 MIL/uL (ref 4.22–5.81)
RDW: 13 % (ref 11.5–15.5)
WBC: 6.8 10*3/uL (ref 4.0–10.5)
nRBC: 0 % (ref 0.0–0.2)

## 2020-05-01 LAB — COMPREHENSIVE METABOLIC PANEL
ALT: 25 U/L (ref 0–44)
AST: 23 U/L (ref 15–41)
Albumin: 3.2 g/dL — ABNORMAL LOW (ref 3.5–5.0)
Alkaline Phosphatase: 76 U/L (ref 38–126)
Anion gap: 8 (ref 5–15)
BUN: 17 mg/dL (ref 8–23)
CO2: 29 mmol/L (ref 22–32)
Calcium: 8.7 mg/dL — ABNORMAL LOW (ref 8.9–10.3)
Chloride: 107 mmol/L (ref 98–111)
Creatinine, Ser: 1.18 mg/dL (ref 0.61–1.24)
GFR, Estimated: >60 mL/min (ref >60)
Glucose, Bld: 100 mg/dL — ABNORMAL HIGH (ref 70–99)
Potassium: 3.3 mmol/L — ABNORMAL LOW (ref 3.5–5.1)
Sodium: 144 mmol/L (ref 135–145)
Total Bilirubin: 0.9 mg/dL (ref 0.3–1.2)
Total Protein: 6.4 g/dL — ABNORMAL LOW (ref 6.5–8.1)

## 2020-05-01 LAB — TROPONIN I (HIGH SENSITIVITY)
Troponin I (High Sensitivity): 10 ng/L (ref <18)
Troponin I (High Sensitivity): 9 ng/L (ref <18)

## 2020-05-01 LAB — URINALYSIS, ROUTINE W REFLEX MICROSCOPIC
Bilirubin Urine: NEGATIVE
Glucose, UA: NEGATIVE mg/dL
Hgb urine dipstick: NEGATIVE
Ketones, ur: 5 mg/dL — AB
Leukocytes,Ua: NEGATIVE
Nitrite: NEGATIVE
Protein, ur: 30 mg/dL — AB
Specific Gravity, Urine: 1.026 (ref 1.005–1.030)
pH: 6 (ref 5.0–8.0)

## 2020-05-01 LAB — CBG MONITORING, ED: Glucose-Capillary: 98 mg/dL (ref 70–99)

## 2020-05-01 NOTE — ED Notes (Signed)
Patient transported to CT 

## 2020-05-01 NOTE — Discharge Instructions (Addendum)
Please have the patient follow up with his cardiologist and/or primary care provider within the next week for reassessment. Please return to the emergency department for any new or worsening symptoms.

## 2020-05-01 NOTE — ED Triage Notes (Signed)
Per [facility]: called out for syncopal episode. Lives with wife. Pt has dementia. Lives at [facility] home. Found pt on the floor unresponsive. When EMS arrived pt alert and oriented to baseline. Has had generalized weakness per wife. Pts BP laying down was 140/90, pt was stood up by EMS and had a seizure with EMS, lasted about 15 seconds, pt post ictal for about 15 seconds, BP was then 136/82 heart rate 60 pt has a fib and a pacemaker. Pt put on monitor, pt in bigeminy and trigeminy PVCs. Pt was given about about 250 ml of saline. Pt placed on 3 L [place]. Pt does have diabetes and hypertension, not on any blood thinners. Stroke screen with EMS was negative. Did have incontinence on scene.

## 2020-05-01 NOTE — ED Notes (Signed)
Loop recorder interrogated by this RN.

## 2020-05-01 NOTE — ED Notes (Signed)
DC instructions reviewed with patient and wife and both verbalized understanding. Patient left in wheelchair. vss and nad.

## 2020-05-01 NOTE — ED Provider Notes (Signed)
[facility] EMERGENCY DEPARTMENT Provider Note   CSN: [id number] Arrival date & time: 05/01/20  1557     History Chief Complaint  Patient presents with  . Fall    Derrick [name] is a 79 y.o. male.  HPI   79 year old male with a history of aortic regurg, CAD, cataracts, diabetes, hyper cholesterolemia, hypertension, mild cognitive impairment, OSA, who presents emergency department today for evaluation of a syncopal episode.  Per EMS, patient lives with his wife.  She was in the other room when she heard a noise in the other room.  She went into the room and the patient was laying on the ground and was nonresponsive.  EMS was called and on their arrival patient was alert and at his mental baseline.  Upon having the patient stand up he had a 15-second period of generalized shaking.  He did not have a prolonged postictal state following this.  He received 250 cc normal saline in route and also was noted to be in bigeminy and trigeminy on the monitor in route.  Patient does have a level 5 caveat due to his history of dementia.  Wife at bedside assists with history. She states pt fell backwards in the bathroom. He has been more fatigued today. They both had their COVID boosters yesterday.   Reviewed records, patient has history of syncope and currently follows with cardiology.  He has an implantable loop recorder in place currently.  Past Medical History:  Diagnosis Date  . Aortic regurgitation    mild by echo 08/2019  . Carotid artery stenosis    1-39% bilateral stenosis by dopplers 08/2019  . Cataract   . Complication of anesthesia    "high blood sugar; he's been loopy last 36h since OR"- 2016  . Diabetes mellitus   . DKA, type 2 ([place])   . Hearing loss   . Hypercholesteremia   . Hypertension   . Insulin pump in place   . Macular degeneration   . MCI (mild cognitive impairment) with memory loss 12/12/2014  . OSA on CPAP 09/11/2014  . PONV (postoperative  nausea and vomiting)   . Uses hearing aid    right ear, patient stated he lost left hearing aid     Patient Active Problem List   Diagnosis Date Noted  . Syncope with normal neurologic examination 10/11/2019  . Syncope and collapse 09/20/2019  . Carotid artery stenosis   . Aortic regurgitation   . SOB (shortness of breath)   . Hypokalemia   . Diabetic ketoacidosis without coma associated with type 2 diabetes mellitus ([place]) 06/15/2016  . AKI (acute kidney injury) ([place]) 06/15/2016  . Diabetes mellitus ([place]) 10/29/2015  . HTN (hypertension) 10/29/2015  . S/P laparoscopic cholecystectomy 10/28/2015  . OSA on CPAP 09/11/2014    Past Surgical History:  Procedure Laterality Date  . CHOLECYSTECTOMY N/A 10/28/2015   Procedure: LAPAROSCOPIC CHOLECYSTECTOMY WITH   INTRAOPERATIVE CHOLANGIOGRAM;  Surgeon: Greer Pickerel, MD;  Location: [facility];  Service: General;  Laterality: N/A;  . SHOULDER ARTHROSCOPY Right 08/24/11   "frozen shoulder; RCR w/labrum tear; arthritis scraping; bone spurs"  . SHOULDER ARTHROSCOPY Left ~ 2006  . TONSILLECTOMY  1949   "in childhood"       Family History  Problem Relation Age of Onset  . Heart disease Father   . High blood pressure Father   . Diabetes Daughter   . Colon cancer Neg Hx   . Stomach cancer Neg Hx   . Pancreatic  cancer Neg Hx   . Esophageal cancer Neg Hx     Social History   Tobacco Use  . Smoking status: Former Smoker    Packs/day: 0.25    Years: 15.00    Pack years: 3.75    Types: Cigarettes    Quit date: 06/23/1983    Years since quitting: 36.8  . Smokeless tobacco: Never Used  Vaping Use  . Vaping Use: Never used  Substance Use Topics  . Alcohol use: No    Alcohol/week: 0.0 standard drinks  . Drug use: No    Home Medications Prior to Admission medications   Medication Sig Start Date End Date Taking? Authorizing Provider  amLODipine (NORVASC) 10 MG tablet Take 10 mg by mouth every evening.    Yes [provider]   Ascorbic Acid (VITAMIN C) 1000 MG tablet Take 1,000 mg by mouth daily.   Yes [provider]  aspirin EC 81 MG tablet Take 81 mg by mouth daily.   Yes [provider]  donepezil (ARICEPT) 10 MG tablet Take 10 mg by mouth at bedtime.   Yes [provider]  labetalol (NORMODYNE) 200 MG tablet Take 200 mg by mouth 2 (two) times daily. 07/08/15  Yes [provider]  levothyroxine (SYNTHROID) 25 MCG tablet Take 25 mcg by mouth daily before breakfast.   Yes [provider]  lisinopril-hydrochlorothiazide (PRINZIDE,ZESTORETIC) 20-12.5 MG per tablet Take 1 tablet by mouth 2 (two) times daily.    Yes [provider]  Multiple Vitamin (MULTIVITAMIN WITH MINERALS) TABS tablet Take 1 tablet by mouth daily.   Yes [provider]  Multiple Vitamins-Minerals (PRESERVISION AREDS PO) Take 1 tablet by mouth daily.    Yes [provider]  NON FORMULARY B-6 vitamin   Yes [provider]  rosuvastatin (CRESTOR) 20 MG tablet Take 20 mg by mouth daily.   Yes [provider]  TRESIBA FLEXTOUCH 100 UNIT/ML FlexTouch Pen Inject 36 Units into the skin in the morning.  07/27/19  Yes [provider]    Allergies    Patient has no known allergies.  Review of Systems   Review of Systems    Physical Exam Updated Vital Signs BP 129/63   Pulse 63   Temp 98.3 F (36.8 C) (Oral)   Resp 18   SpO2 96%   Physical Exam Vitals and nursing note reviewed.  Constitutional:      Appearance: He is well-developed.  HENT:     Head: Normocephalic and atraumatic.  Eyes:     Conjunctiva/sclera: Conjunctivae normal.  Cardiovascular:     Rate and Rhythm: Normal rate and regular rhythm.     Heart sounds: Normal heart sounds. No murmur heard.   Pulmonary:     Effort: Pulmonary effort is normal. No respiratory distress.     Breath sounds: Normal breath sounds. No wheezing, rhonchi or rales.  Abdominal:     General: Bowel sounds  are normal.     Palpations: Abdomen is soft.     Tenderness: There is no abdominal tenderness.  Musculoskeletal:     Cervical back: Neck supple.  Skin:    General: Skin is warm and dry.  Neurological:     Mental Status: He is alert.     Comments: Mental Status:  Alert, demented, oriented to self (baseline). Able to follow simple commands Cranial Nerves:  II:  pupils equal, round, reactive to light III,IV, VI: ptosis not present, extra-ocular motions intact bilaterally  V,VII: smile symmetric, facial  light touch sensation equal VIII: hearing grossly normal to voice  X: uvula elevates symmetrically  XI: bilateral shoulder shrug symmetric and strong XII: midline tongue extension without fassiculations Motor:  Normal tone. 5/5 strength of BUE and BLE major muscle groups including strong and equal grip strength and dorsiflexion/plantar flexion Sensory: light touch normal in all extremities.      ED Results / Procedures / Treatments   Labs (all labs ordered are listed, but only abnormal results are displayed) Labs Reviewed  CBC WITH DIFFERENTIAL/PLATELET - Abnormal; Notable for the following components:      Result Value   Lymphs Abs 0.6 (*)    All other components within normal limits  COMPREHENSIVE METABOLIC PANEL - Abnormal; Notable for the following components:   Potassium 3.3 (*)    Glucose, Bld 100 (*)    Calcium 8.7 (*)    Total Protein 6.4 (*)    Albumin 3.2 (*)    All other components within normal limits  URINALYSIS, ROUTINE W REFLEX MICROSCOPIC - Abnormal; Notable for the following components:   Ketones, ur 5 (*)    Protein, ur 30 (*)    Bacteria, UA RARE (*)    All other components within normal limits  CBG MONITORING, ED  TROPONIN I (HIGH SENSITIVITY)  TROPONIN I (HIGH SENSITIVITY)    EKG EKG Interpretation  Date/Time:  Wednesday May 01 2020 15:56:44 EST Ventricular Rate:  63 PR Interval:    QRS Duration: 108 QT Interval:  434 QTC  Calculation: 445 R Axis:   -36 Text Interpretation: Sinus rhythm Borderline prolonged PR interval Left axis deviation Anteroseptal infarct, old Borderline T abnormalities, inferior leads No significant change since last tracing Confirmed by Isla Pence [phone]) on 05/01/2020 5:29:34 PM   Radiology CT Head Wo Contrast  Result Date: 05/01/2020 CLINICAL DATA:  Facial trauma.  Additional history provided: SZ EXAM: CT HEAD WITHOUT CONTRAST TECHNIQUE: Contiguous axial images were obtained from the base of the skull through the vertex without intravenous contrast. COMPARISON:  Head CT 08/28/2019.  Brain MRI 08/27/2011. FINDINGS: Brain: Mild cerebral atrophy. Chronic infarcts within the right corona radiata, left basal ganglia and bilateral thalami. Background severe hypoattenuation within the cerebral white matter is nonspecific, but compatible with chronic small vessel ischemic disease. There is no acute intracranial hemorrhage. No demarcated cortical infarct. No extra-axial fluid collection. No evidence of intracranial mass. No midline shift. Vascular: No hyperdense vessel.  Atherosclerotic calcifications. Skull: Normal. Negative for fracture or focal lesion. Sinuses/Orbits: Visualized orbits show no acute finding. Mild ethmoid and maxillary sinus mucosal thickening at the imaged levels. Other: Anterior subluxation of the mandibular condyles bilaterally. IMPRESSION: No evidence of acute intracranial abnormality. Anterior subluxation of the mandibular condyles bilaterally. 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 Background mild cerebral atrophy and advanced cerebral white matter chronic small vessel ischemic disease. Mild ethmoid and maxillary sinus mucosal thickening. Electronically Signed   By: Kellie Simmering DO   On: 05/01/2020 17:38    Procedures Procedures (including critical  care time)  Medications Ordered in ED Medications - No data to display  ED Course  I have reviewed the triage vital signs and the nursing notes.  Pertinent labs & imaging results that were available during my care of the patient were reviewed by me and considered in my medical decision making (see chart for details).    MDM Rules/Calculators/A&P  79 year old male presented emergency department today for evaluation after a fall with concern for possible syncope.  Following this had a 15-second episode of shaking that resolved and there was no postictal state.  Patient has history of same and currently has a loop recorder.  Reviewed/interpreted labs CBC is unremarkable CMP with mild hypokalemia which appears chronic, otherwise reassuring Troponin is negative UA neg for UTI  CT head reviewed/interpreted - No evidence of acute intracranial abnormality. Anterior subluxation of the mandibular condyles bilaterally. Correlate with physical examination to exclude TMJ dislocation. Redemonstrated chronic infarcts within the right corona radiata, left basal ganglia and bilateral thalami. Background mild cerebral atrophy and advanced cerebral white matter chronic small vessel ischemic disease. Mild ethmoid and maxillary sinus mucosal thickening.  - clinically pt does not have TMJ dislocation  Pt w/u today is reassuring. He is eating and drinking normally and at his mental baseline. Loop recorder was interrogated and there were no abnormal cardiac episodes recorded. He does not appear to have an urgent indication for further w/u or admission at this time. Discussed w/u and plan for discharge with wife at bedside. She voices understanding of the plan and reasons to return. All questions answered, pt stable for discharge.   Pt seen in conjunction with supervising physician, Dr. Gilford Raid who personally evaluated the patient and is in agreement with plan.   Final Clinical  Impression(s) / ED Diagnoses Final diagnoses:  Fall, initial encounter    Rx / DC Orders ED Discharge Orders    None       Bishop Dublin 05/01/20 2047    Isla Pence, MD 05/01/20 2155

## 2020-05-06 ENCOUNTER — Telehealth: Payer: Self-pay | Admitting: Adult Health

## 2020-05-06 DIAGNOSIS — I351 Nonrheumatic aortic (valve) insufficiency: Secondary | ICD-10-CM

## 2020-05-06 DIAGNOSIS — E1110 Type 2 diabetes mellitus with ketoacidosis without coma: Secondary | ICD-10-CM

## 2020-05-06 DIAGNOSIS — G4733 Obstructive sleep apnea (adult) (pediatric): Secondary | ICD-10-CM

## 2020-05-06 NOTE — Telephone Encounter (Signed)
Called the patient to discuss concerns and the plan. There was no answer. Unable to LVM. If patient calls back please advise that in order to get a new machine most insurance requires that we submit office notes within 6 months documenting compliance. His last visit was in April which would be right outside of that window. I have forwarded the order for a new machine to the company and I am clarifying if that is necessary.

## 2020-05-06 NOTE — Telephone Encounter (Signed)
This happens after 5 years when the software is not longer supported.  The message is commonly seen but often the machine is still working for a while. I can send a new order out, but due to supply chain restrains we have been waiting longer for their delivery.   I order an HST to confirm level of apnea and a new machine -

## 2020-05-06 NOTE — Addendum Note (Signed)
Addended by: Darleen Crocker on: 05/06/2020 05:01 PM   Modules accepted: Orders

## 2020-05-06 NOTE — Telephone Encounter (Signed)
Pt's wife, Narciso Stoutenburg (on [place]) called, message came up on CPAP machine "check motor life". Contacted [employer], machine on last leg; can not be fixed something wrong with the motor.  [employer] said the physician would have to send a new order for new CPAP machine. Would like a call from the nurse.

## 2020-05-08 DIAGNOSIS — G4733 Obstructive sleep apnea (adult) (pediatric): Secondary | ICD-10-CM | POA: Diagnosis not present

## 2020-05-15 DIAGNOSIS — N182 Chronic kidney disease, stage 2 (mild): Secondary | ICD-10-CM | POA: Diagnosis not present

## 2020-05-15 DIAGNOSIS — E663 Overweight: Secondary | ICD-10-CM | POA: Diagnosis not present

## 2020-05-15 DIAGNOSIS — I129 Hypertensive chronic kidney disease with stage 1 through stage 4 chronic kidney disease, or unspecified chronic kidney disease: Secondary | ICD-10-CM | POA: Diagnosis not present

## 2020-05-15 DIAGNOSIS — E1149 Type 2 diabetes mellitus with other diabetic neurological complication: Secondary | ICD-10-CM | POA: Diagnosis not present

## 2020-05-15 DIAGNOSIS — Z794 Long term (current) use of insulin: Secondary | ICD-10-CM | POA: Diagnosis not present

## 2020-05-17 LAB — CUP PACEART REMOTE DEVICE CHECK
Date Time Interrogation Session: 20211123104105
Implantable Pulse Generator Implant Date: 20210714

## 2020-05-20 ENCOUNTER — Ambulatory Visit (INDEPENDENT_AMBULATORY_CARE_PROVIDER_SITE_OTHER): Payer: Medicare PPO

## 2020-05-20 DIAGNOSIS — R55 Syncope and collapse: Secondary | ICD-10-CM | POA: Diagnosis not present

## 2020-05-24 NOTE — Progress Notes (Signed)
Carelink Summary Report / Loop Recorder 

## 2020-06-17 ENCOUNTER — Ambulatory Visit (INDEPENDENT_AMBULATORY_CARE_PROVIDER_SITE_OTHER): Payer: Medicare PPO

## 2020-06-17 DIAGNOSIS — R55 Syncope and collapse: Secondary | ICD-10-CM | POA: Diagnosis not present

## 2020-06-17 LAB — CUP PACEART REMOTE DEVICE CHECK
Date Time Interrogation Session: 20211226104142
Implantable Pulse Generator Implant Date: 20210714

## 2020-06-28 DIAGNOSIS — H6123 Impacted cerumen, bilateral: Secondary | ICD-10-CM | POA: Diagnosis not present

## 2020-07-01 NOTE — Progress Notes (Signed)
Carelink Summary Report / Loop Recorder 

## 2020-07-19 ENCOUNTER — Emergency Department (HOSPITAL_COMMUNITY): Payer: Medicare PPO

## 2020-07-19 ENCOUNTER — Observation Stay (HOSPITAL_COMMUNITY)
Admission: EM | Admit: 2020-07-19 | Discharge: 2020-07-21 | Disposition: A | Discharge status: Home / Self Care | Payer: Medicare PPO | Attending: Cardiology | Admitting: Cardiology

## 2020-07-19 DIAGNOSIS — Z79899 Other long term (current) drug therapy: Secondary | ICD-10-CM | POA: Insufficient documentation

## 2020-07-19 DIAGNOSIS — I455 Other specified heart block: Secondary | ICD-10-CM | POA: Diagnosis present

## 2020-07-19 DIAGNOSIS — I6529 Occlusion and stenosis of unspecified carotid artery: Secondary | ICD-10-CM | POA: Diagnosis present

## 2020-07-19 DIAGNOSIS — Z9181 History of falling: Secondary | ICD-10-CM | POA: Diagnosis not present

## 2020-07-19 DIAGNOSIS — Z9989 Dependence on other enabling machines and devices: Secondary | ICD-10-CM

## 2020-07-19 DIAGNOSIS — E1159 Type 2 diabetes mellitus with other circulatory complications: Secondary | ICD-10-CM

## 2020-07-19 DIAGNOSIS — E875 Hyperkalemia: Secondary | ICD-10-CM | POA: Diagnosis not present

## 2020-07-19 DIAGNOSIS — Z87891 Personal history of nicotine dependence: Secondary | ICD-10-CM | POA: Diagnosis not present

## 2020-07-19 DIAGNOSIS — F0390 Unspecified dementia without behavioral disturbance: Secondary | ICD-10-CM | POA: Diagnosis not present

## 2020-07-19 DIAGNOSIS — E876 Hypokalemia: Secondary | ICD-10-CM | POA: Diagnosis present

## 2020-07-19 DIAGNOSIS — I10 Essential (primary) hypertension: Secondary | ICD-10-CM | POA: Diagnosis present

## 2020-07-19 DIAGNOSIS — E119 Type 2 diabetes mellitus without complications: Secondary | ICD-10-CM | POA: Insufficient documentation

## 2020-07-19 DIAGNOSIS — R55 Syncope and collapse: Secondary | ICD-10-CM | POA: Diagnosis not present

## 2020-07-19 DIAGNOSIS — Z20822 Contact with and (suspected) exposure to covid-19: Secondary | ICD-10-CM | POA: Insufficient documentation

## 2020-07-19 DIAGNOSIS — Z7982 Long term (current) use of aspirin: Secondary | ICD-10-CM | POA: Insufficient documentation

## 2020-07-19 DIAGNOSIS — R531 Weakness: Secondary | ICD-10-CM | POA: Diagnosis not present

## 2020-07-19 DIAGNOSIS — G4733 Obstructive sleep apnea (adult) (pediatric): Secondary | ICD-10-CM

## 2020-07-19 DIAGNOSIS — R0902 Hypoxemia: Secondary | ICD-10-CM | POA: Diagnosis not present

## 2020-07-19 DIAGNOSIS — R4020 Unspecified coma: Secondary | ICD-10-CM | POA: Diagnosis not present

## 2020-07-19 DIAGNOSIS — Z794 Long term (current) use of insulin: Secondary | ICD-10-CM | POA: Insufficient documentation

## 2020-07-19 LAB — CREATININE, SERUM
Creatinine, Ser: 1.02 mg/dL (ref 0.61–1.24)
GFR, Estimated: >60 mL/min (ref >60)

## 2020-07-19 LAB — CBC WITH DIFFERENTIAL/PLATELET
Abs Immature Granulocytes: 0.04 10*3/uL (ref 0.00–0.07)
Basophils Absolute: 0.1 10*3/uL (ref 0.0–0.1)
Basophils Relative: 1 %
Eosinophils Absolute: 0.2 10*3/uL (ref 0.0–0.5)
Eosinophils Relative: 2 %
HCT: 43.1 % (ref 39.0–52.0)
Hemoglobin: 14.4 g/dL (ref 13.0–17.0)
Immature Granulocytes: 0 %
Lymphocytes Relative: 10 %
Lymphs Abs: 1.1 10*3/uL (ref 0.7–4.0)
MCH: 31 pg (ref 26.0–34.0)
MCHC: 33.4 g/dL (ref 30.0–36.0)
MCV: 92.9 fL (ref 80.0–100.0)
Monocytes Absolute: 0.7 10*3/uL (ref 0.1–1.0)
Monocytes Relative: 7 %
Neutro Abs: 8.7 10*3/uL — ABNORMAL HIGH (ref 1.7–7.7)
Neutrophils Relative %: 80 %
Platelets: 185 10*3/uL (ref 150–400)
RBC: 4.64 MIL/uL (ref 4.22–5.81)
RDW: 13.2 % (ref 11.5–15.5)
WBC: 10.8 10*3/uL — ABNORMAL HIGH (ref 4.0–10.5)
nRBC: 0 % (ref 0.0–0.2)

## 2020-07-19 LAB — COMPREHENSIVE METABOLIC PANEL
ALT: 37 U/L (ref 0–44)
AST: 104 U/L — ABNORMAL HIGH (ref 15–41)
Albumin: 3.3 g/dL — ABNORMAL LOW (ref 3.5–5.0)
Alkaline Phosphatase: 77 U/L (ref 38–126)
Anion gap: 9 (ref 5–15)
BUN: 18 mg/dL (ref 8–23)
CO2: 27 mmol/L (ref 22–32)
Calcium: 8.6 mg/dL — ABNORMAL LOW (ref 8.9–10.3)
Chloride: 105 mmol/L (ref 98–111)
Creatinine, Ser: 1.13 mg/dL (ref 0.61–1.24)
GFR, Estimated: >60 mL/min (ref >60)
Glucose, Bld: 91 mg/dL (ref 70–99)
Potassium: 5.5 mmol/L — ABNORMAL HIGH (ref 3.5–5.1)
Sodium: 141 mmol/L (ref 135–145)
Total Bilirubin: 1.5 mg/dL — ABNORMAL HIGH (ref 0.3–1.2)
Total Protein: 6.3 g/dL — ABNORMAL LOW (ref 6.5–8.1)

## 2020-07-19 LAB — CBG MONITORING, ED
Glucose-Capillary: 109 mg/dL — ABNORMAL HIGH (ref 70–99)
Glucose-Capillary: 229 mg/dL — ABNORMAL HIGH (ref 70–99)
Glucose-Capillary: 212 mg/dL — ABNORMAL HIGH (ref 70–99)

## 2020-07-19 LAB — URINALYSIS, ROUTINE W REFLEX MICROSCOPIC
Bacteria, UA: NONE SEEN
Bilirubin Urine: NEGATIVE
Glucose, UA: NEGATIVE mg/dL
Hgb urine dipstick: NEGATIVE
Ketones, ur: 5 mg/dL — AB
Leukocytes,Ua: NEGATIVE
Nitrite: NEGATIVE
Protein, ur: 30 mg/dL — AB
Specific Gravity, Urine: 1.02 (ref 1.005–1.030)
pH: 6 (ref 5.0–8.0)

## 2020-07-19 LAB — CBC
HCT: 40.2 % (ref 39.0–52.0)
Hemoglobin: 13.5 g/dL (ref 13.0–17.0)
MCH: 31 pg (ref 26.0–34.0)
MCHC: 33.6 g/dL (ref 30.0–36.0)
MCV: 92.4 fL (ref 80.0–100.0)
Platelets: 172 10*3/uL (ref 150–400)
RBC: 4.35 MIL/uL (ref 4.22–5.81)
RDW: 12.9 % (ref 11.5–15.5)
WBC: 8.3 10*3/uL (ref 4.0–10.5)
nRBC: 0 % (ref 0.0–0.2)

## 2020-07-19 LAB — TROPONIN I (HIGH SENSITIVITY)
Troponin I (High Sensitivity): 7 ng/L (ref <18)
Troponin I (High Sensitivity): 9 ng/L (ref <18)

## 2020-07-19 LAB — TSH: TSH: 4.697 u[IU]/mL — ABNORMAL HIGH (ref 0.350–4.500)

## 2020-07-19 MED ORDER — SODIUM ZIRCONIUM CYCLOSILICATE 5 G PO PACK
5.0000 g | PACK | Freq: Once | ORAL | Status: AC
  Administered 2020-07-19: 5 g via ORAL
  Filled 2020-07-19: qty 1

## 2020-07-19 MED ORDER — INSULIN GLARGINE 100 UNIT/ML ~~LOC~~ SOLN
36.0000 [IU] | Freq: Every morning | SUBCUTANEOUS | Status: DC
  Administered 2020-07-20: 36 [IU] via SUBCUTANEOUS
  Filled 2020-07-19 (×3): qty 0.36

## 2020-07-19 MED ORDER — AMLODIPINE BESYLATE 10 MG PO TABS
10.0000 mg | ORAL_TABLET | Freq: Every evening | ORAL | Status: DC
  Administered 2020-07-19 – 2020-07-20 (×2): 10 mg via ORAL
  Filled 2020-07-19: qty 2
  Filled 2020-07-19: qty 1

## 2020-07-19 MED ORDER — QUETIAPINE FUMARATE 25 MG PO TABS
25.0000 mg | ORAL_TABLET | Freq: Once | ORAL | Status: DC
  Filled 2020-07-19: qty 1

## 2020-07-19 MED ORDER — NITROGLYCERIN 0.4 MG SL SUBL: 0.4000 mg | SUBLINGUAL_TABLET | SUBLINGUAL | Status: DC | PRN

## 2020-07-19 MED ORDER — DONEPEZIL HCL 10 MG PO TABS
10.0000 mg | ORAL_TABLET | Freq: Every day | ORAL | Status: DC
  Administered 2020-07-19 – 2020-07-20 (×2): 10 mg via ORAL
  Filled 2020-07-19 (×3): qty 1

## 2020-07-19 MED ORDER — LEVOTHYROXINE SODIUM 25 MCG PO TABS
25.0000 ug | ORAL_TABLET | Freq: Every day | ORAL | Status: DC
  Administered 2020-07-20 – 2020-07-21 (×2): 25 ug via ORAL
  Filled 2020-07-19 (×2): qty 1

## 2020-07-19 MED ORDER — HEPARIN SODIUM (PORCINE) 5000 UNIT/ML IJ SOLN
5000.0000 [IU] | Freq: Three times a day (TID) | INTRAMUSCULAR | Status: DC
  Administered 2020-07-19 – 2020-07-21 (×5): 5000 [IU] via SUBCUTANEOUS
  Filled 2020-07-19 (×5): qty 1

## 2020-07-19 MED ORDER — ASCORBIC ACID 500 MG PO TABS
1000.0000 mg | ORAL_TABLET | Freq: Every day | ORAL | Status: DC
  Administered 2020-07-19 – 2020-07-21 (×3): 1000 mg via ORAL
  Filled 2020-07-19 (×3): qty 2

## 2020-07-19 MED ORDER — ONDANSETRON HCL 4 MG/2ML IJ SOLN: 4.0000 mg | Freq: Four times a day (QID) | INTRAMUSCULAR | Status: DC | PRN

## 2020-07-19 MED ORDER — ASPIRIN EC 81 MG PO TBEC
81.0000 mg | DELAYED_RELEASE_TABLET | Freq: Every day | ORAL | Status: DC
  Administered 2020-07-19 – 2020-07-21 (×3): 81 mg via ORAL
  Filled 2020-07-19 (×3): qty 1

## 2020-07-19 MED ORDER — HYDROCHLOROTHIAZIDE 12.5 MG PO CAPS
12.5000 mg | ORAL_CAPSULE | Freq: Every day | ORAL | Status: DC
  Administered 2020-07-19 – 2020-07-20 (×2): 12.5 mg via ORAL
  Filled 2020-07-19 (×2): qty 1

## 2020-07-19 MED ORDER — ADULT MULTIVITAMIN W/MINERALS CH
1.0000 | ORAL_TABLET | Freq: Every day | ORAL | Status: DC
  Administered 2020-07-19 – 2020-07-21 (×3): 1 via ORAL
  Filled 2020-07-19 (×3): qty 1

## 2020-07-19 MED ORDER — ACETAMINOPHEN 325 MG PO TABS: 650.0000 mg | ORAL_TABLET | ORAL | Status: DC | PRN

## 2020-07-19 NOTE — ED Notes (Signed)
[employer] pacemaker interrogated 

## 2020-07-19 NOTE — ED Provider Notes (Signed)
  Physical Exam  BP 134/60   Pulse 66   Temp 98 F (36.7 C)   Resp (!) 32   SpO2 96%   Physical Exam  ED Course/Procedures   Clinical Course as of 07/19/20 1624  Fri Jul 19, 2020  1608 Ultimately this case describes multiple problems with the system.  The nursing staff did the appropriate work-up by ordering the labs including a troponin.  For some reason this did not get processed in the lab, this has been a problem we have encountered for well over a decade where labs will get canceled but nobody is notified.  Not sure if this is computer-based, technician based or something else but needs to be addressed at a system level.  Additionally though best efforts were made to get patients back in a timely manner when their ESI level 2, space has been almost completely saturated with admitted patients that are not able to go upstairs.  Shortages and staff nurses, shortages of available beds all play a role in the downstream effects of the skin of cases.  There have been multiple cases this week where shortage of phlebotomy has left patient sitting for hours without labs drawn.  Even blood work that is brought to the lab by the RN after drying when they gets a new patient sits on the counter for an hour and only starts being run when they are called and asked where it is. [BM]    Clinical Course User Index [BM] Noemi Chapel, MD    Procedures  MDM  This is a 80 year old male with a past medical history of dementia and syncope who had a loop recorder in place who presents to the emergency department for near syncope, patient was walking in the grocery store and slowly slumped down, the wife was able to ease him to the ground, did not hit his head or sustain any traumatic injuries.  Patient was signed out from previous provider with the plan to obtain basic lab work and with no significant abnormalities to ambulate and discharge home.  We were able to interrogate his loop recorder which did show  evidence of significant sinus pauses around the reported time that he had this event.  1 sinus pauses for 3 seconds and one sinus pause was for 7 seconds.  Concern for arrhythmia causing near syncope in this elderly patient who is unable to provide much more history secondary to his dementia, I did speak with cardiology, Dr. Harl Bowie will plan to admit to the hospital under observation status, washout his labetalol which could be worsening a possible AV block, patient has had no arrhythmia on telemetry while in the emergency department and his mentation has been at baseline per the wife.  He feels well, however given high risk for arrhythmia and the evidence of sinus pauses on the loop recorder, the patient was admitted and stable condition.     Camila Li, MD 07/20/20 Adelfa Koh    Noemi Chapel, MD 07/21/20 [phone]

## 2020-07-19 NOTE — ED Provider Notes (Signed)
[facility] EMERGENCY DEPARTMENT Provider Note   CSN: [id number] Arrival date & time: 07/19/20  1420     History No chief complaint on file.  LEVEL 5 CAVEAT - COGNITIVE IMPAIRMENT  Derrick [name] is a 80 y.o. male with PMHx mild cognitive impairment, HTN, Diabetes, Aortic regurg, with internal loop recorder who presents to the ED via EMS with near syncopal episode that occurred earlier today. Most of history obtained by wife -reports they had just eaten lunch and went to the grocery store. Pt was pushing the cart and wife was walking infront of him. When they turned the corner wife turned out and saw pt was starting to slump over the cart and looked like he was going to pass out. She was able to catch him and ease him down to the ground without any full syncope or head injury. Wife reports pt was talking to her the entire time however did seem slow with his responses. EMS was called; CBG was checked at 119. No obvious deficits were appreciated on EMS eval and pt/wife were given the option of going home or coming to the ED for further evaluation. Wife wanted to ensure pt was okay so she decided to bring him here. She reports that he is currently at his baseline mental status. She has not noticed any facial droop, slurring of speech, or unilateral weakness. Pt has a history of occasional facial grimaces and wife reports this is normal for him. Pt has no complaints at this time however wife is concerned he could have a UTI - she reports she noticed a small amount of urine in his underwear today that seemed dark to her; denies foul smell. Per wife pt does have history of syncopal episodes which is why the loop recorder was placed by cardiology.   Loop recorder check 12/26 without any abnormalities   The history is provided by the patient, the spouse, medical records and the EMS personnel.       Past Medical History:  Diagnosis Date  . Aortic regurgitation    mild by echo  08/2019  . Carotid artery stenosis    1-39% bilateral stenosis by dopplers 08/2019  . Cataract   . Complication of anesthesia    "high blood sugar; he's been loopy last 36h since OR"- 2016  . Diabetes mellitus   . DKA, type 2 ([place])   . Hearing loss   . Hypercholesteremia   . Hypertension   . Insulin pump in place   . Macular degeneration   . MCI (mild cognitive impairment) with memory loss 12/12/2014  . OSA on CPAP 09/11/2014  . PONV (postoperative nausea and vomiting)   . Uses hearing aid    right ear, patient stated he lost left hearing aid     Patient Active Problem List   Diagnosis Date Noted  . Syncope with normal neurologic examination 10/11/2019  . Syncope and collapse 09/20/2019  . Carotid artery stenosis   . Aortic regurgitation   . SOB (shortness of breath)   . Hypokalemia   . Diabetic ketoacidosis without coma associated with type 2 diabetes mellitus ([place]) 06/15/2016  . AKI (acute kidney injury) ([place]) 06/15/2016  . Diabetes mellitus ([place]) 10/29/2015  . HTN (hypertension) 10/29/2015  . S/P laparoscopic cholecystectomy 10/28/2015  . OSA on CPAP 09/11/2014    Past Surgical History:  Procedure Laterality Date  . CHOLECYSTECTOMY N/A 10/28/2015   Procedure: LAPAROSCOPIC CHOLECYSTECTOMY WITH   INTRAOPERATIVE CHOLANGIOGRAM;  Surgeon: Greer Pickerel, MD;  Location: [facility];  Service: General;  Laterality: N/A;  . SHOULDER ARTHROSCOPY Right 08/24/11   "frozen shoulder; RCR w/labrum tear; arthritis scraping; bone spurs"  . SHOULDER ARTHROSCOPY Left ~ 2006  . TONSILLECTOMY  1949   "in childhood"       Family History  Problem Relation Age of Onset  . Heart disease Father   . High blood pressure Father   . Diabetes Daughter   . Colon cancer Neg Hx   . Stomach cancer Neg Hx   . Pancreatic cancer Neg Hx   . Esophageal cancer Neg Hx     Social History   Tobacco Use  . Smoking status: Former Smoker    Packs/day: 0.25    Years: 15.00    Pack years: 3.75    Types:  Cigarettes    Quit date: 06/23/1983    Years since quitting: 37.0  . Smokeless tobacco: Never Used  Vaping Use  . Vaping Use: Never used  Substance Use Topics  . Alcohol use: No    Alcohol/week: 0.0 standard drinks  . Drug use: No    Home Medications Prior to Admission medications   Medication Sig Start Date End Date Taking? Authorizing Provider  amLODipine (NORVASC) 10 MG tablet Take 10 mg by mouth every evening.     [provider]  Ascorbic Acid (VITAMIN C) 1000 MG tablet Take 1,000 mg by mouth daily.    [provider]  aspirin EC 81 MG tablet Take 81 mg by mouth daily.    [provider]  donepezil (ARICEPT) 10 MG tablet Take 10 mg by mouth at bedtime.    [provider]  labetalol (NORMODYNE) 200 MG tablet Take 200 mg by mouth 2 (two) times daily. 07/08/15   [provider]  levothyroxine (SYNTHROID) 25 MCG tablet Take 25 mcg by mouth daily before breakfast.    [provider]  lisinopril-hydrochlorothiazide (PRINZIDE,ZESTORETIC) 20-12.5 MG per tablet Take 1 tablet by mouth 2 (two) times daily.     [provider]  Multiple Vitamin (MULTIVITAMIN WITH MINERALS) TABS tablet Take 1 tablet by mouth daily.    [provider]  Multiple Vitamins-Minerals (PRESERVISION AREDS PO) Take 1 tablet by mouth daily.     [provider]  NON FORMULARY B-6 vitamin    [provider]  rosuvastatin (CRESTOR) 20 MG tablet Take 20 mg by mouth daily.    [provider]  TRESIBA FLEXTOUCH 100 UNIT/ML FlexTouch Pen Inject 36 Units into the skin in the morning.  07/27/19   [provider]    Allergies    Patient has no known allergies.  Review of Systems   Review of Systems  Unable to perform ROS: Dementia  Constitutional: Negative for fever.  Respiratory: Negative for shortness of breath.   Cardiovascular: Negative for chest pain.  Neurological:       + pre syncope    Physical Exam Updated  Vital Signs BP 136/68   Pulse (!) 58   Temp 98 F (36.7 C)   Resp 18   SpO2 98%   Physical Exam Vitals and nursing note reviewed.  Constitutional:      Appearance: He is not ill-appearing or diaphoretic.  HENT:     Head: Normocephalic and atraumatic.  Eyes:     Extraocular Movements: Extraocular movements intact.     Conjunctiva/sclera: Conjunctivae normal.     Pupils: Pupils are equal, round, and reactive to light.  Cardiovascular:     Rate and  Rhythm: Normal rate and regular rhythm.     Pulses: Normal pulses.  Pulmonary:     Effort: Pulmonary effort is normal.     Breath sounds: Normal breath sounds. No wheezing, rhonchi or rales.  Abdominal:     Palpations: Abdomen is soft.     Tenderness: There is no abdominal tenderness. There is no guarding or rebound.  Musculoskeletal:     Cervical back: Neck supple.  Skin:    General: Skin is warm and dry.  Neurological:     Mental Status: He is alert.     Comments: Alert to person, place, and situation. Unable to recall the year or month which wife reports is baseline  Speech is fluent, clear without dysarthria or dysphasia.   Intermittent facial grimace appreciated  Strength 5/5 in upper/lower extremities  Sensation intact in upper/lower extremities   No pronator drift.  Normal finger-to-nose and feet tapping.  CN I not tested  CN II grossly intact visual fields bilaterally. Did not visualize posterior eye.   CN III, IV, VI PERRLA and EOMs intact bilaterally  CN V Intact sensation to sharp and light touch to the face  CN VII facial movements symmetric  CN VIII not tested  CN IX, X no uvula deviation, symmetric rise of soft palate  CN XI 5/5 SCM and trapezius strength bilaterally  CN XII Midline tongue protrusion, symmetric L/R movements       ED Results / Procedures / Treatments   Labs (all labs ordered are listed, but only abnormal results are displayed) Labs Reviewed  CBC WITH DIFFERENTIAL/PLATELET - Abnormal;  Notable for the following components:      Result Value   WBC 10.8 (*)    Neutro Abs 8.7 (*)    All other components within normal limits  CBG MONITORING, ED - Abnormal; Notable for the following components:   Glucose-Capillary 109 (*)    All other components within normal limits  COMPREHENSIVE METABOLIC PANEL  URINALYSIS, ROUTINE W REFLEX MICROSCOPIC  TROPONIN I (HIGH SENSITIVITY)    EKG None  Radiology No results found.  Procedures Procedures   Medications Ordered in ED Medications - No data to display  ED Course  I have reviewed the triage vital signs and the nursing notes.  Pertinent labs & imaging results that were available during my care of the patient were reviewed by me and considered in my medical decision making (see chart for details).    MDM Rules/Calculators/A&P                          80 year old male who presents to the ED today with complaint of near syncope while at the grocery store.  Wife was able to catch him and put him down to the ground.  She is worried about a UTI as she noticed some dark-colored urine on his underwear earlier today.  Patient is back at baseline now.  Reports history of full syncopal episodes in the past with loop recorder placement by cardiology.  On arrival to the ED vitals are stable.  Patient is afebrile, nontachycardic and nontachypneic.  Heart rate was at 58 on arrival however EKG obtained with heart rate of 62 which does appear to be more around patient's baseline.  He has no focal neuro deficits on exam today.  He does have history of mild cognitive impairment and again wife reports that patient is at baseline.  Will work-up for presyncope at this time with lab  work including CBC, CMP, troponin, UA, CBG.  Will obtain orthostatic vital signs.  Unfortunately unable to interrogate loop recorder here.  We will plan to ambulate patient for safety if no acute findings on lab work and given this was presyncope versus full syncope I do feel  patient could likely go home if no significant abnormalities on labs.   At shift change case signed out to oncoming team with Dr. Suzzanne Cloud Resident who will dispo patient accordingly.   This note was prepared using Dragon voice recognition software and may include unintentional dictation errors due to the inherent limitations of voice recognition software.  Final Clinical Impression(s) / ED Diagnoses Final diagnoses:  None    Rx / DC Orders ED Discharge Orders    None       Eustaquio Maize, PA-C 07/19/20 1540    Davonna Belling, MD 07/20/20 [phone]

## 2020-07-19 NOTE — H&P (Signed)
Cardiology Admission History and Physical:   Patient ID: Derrick [name] MRN: [id number]; DOB: 10/23/1940   Admission date: 07/19/2020  Primary Care Provider: Burnard Bunting, MD [facility] Cardiologist: Dr Lona [facility] Electrophysiologist:  Dr Lovena Le  Chief Complaint:  Syncope  Patient Profile:   Derrick [name] is a 80 y.o. male history of unexplained syncope followed by EP with loop recorder, dementia, HTN, DM2, hyperlipidemia, aortic regurgitation, carotid stenosis, admitted with syncope   History of Present Illness:   Derrick [name] 80 yo male history of unexplained syncope followed by EP with loop recorder, dementia, HTN, DM2, hyperlipidemia, aortic regurgitation, carotid stenosis, admitted with syncope  Prior history of unexplained syncope however last episode prior to admission was Feb 2021. Today was Derrick [name] with his wife walking with shopping cart. Wife turned to pick an item from shelf, turned around and caught him falling to the floor with altered consciousness. Reports LOC for just a few seconds. No prodrome or prior symptoms. Had felt well leading up to episode.   WBC 10.8 Hgb 14.4 K 5.5 Cr 1.13 Trop 7 CXR no acute process EKG NSR 08/2019 echo LVEF 60-65%, no WMAs, indet dd, normal RV function, mild AI    Past Medical History:  Diagnosis Date  . Aortic regurgitation    mild by echo 08/2019  . Carotid artery stenosis    1-39% bilateral stenosis by dopplers 08/2019  . Cataract   . Complication of anesthesia    "high blood sugar; he's been loopy last 36h since OR"- 2016  . Diabetes mellitus   . DKA, type 2 ([place])   . Hearing loss   . Hypercholesteremia   . Hypertension   . Insulin pump in place   . Macular degeneration   . MCI (mild cognitive impairment) with memory loss 12/12/2014  . OSA on CPAP 09/11/2014  . PONV (postoperative nausea and vomiting)   . Uses hearing aid    right ear, patient stated he lost left hearing aid     Past  Surgical History:  Procedure Laterality Date  . CHOLECYSTECTOMY N/A 10/28/2015   Procedure: LAPAROSCOPIC CHOLECYSTECTOMY WITH   INTRAOPERATIVE CHOLANGIOGRAM;  Surgeon: Greer Pickerel, MD;  Location: [facility];  Service: General;  Laterality: N/A;  . SHOULDER ARTHROSCOPY Right 08/24/11   "frozen shoulder; RCR w/labrum tear; arthritis scraping; bone spurs"  . SHOULDER ARTHROSCOPY Left ~ 2006  . TONSILLECTOMY  1949   "in childhood"     Medications Prior to Admission: Prior to Admission medications   Medication Sig Start Date End Date Taking? Authorizing Provider  amLODipine (NORVASC) 10 MG tablet Take 10 mg by mouth every evening.     [provider]  Ascorbic Acid (VITAMIN C) 1000 MG tablet Take 1,000 mg by mouth daily.    [provider]  aspirin EC 81 MG tablet Take 81 mg by mouth daily.    [provider]  donepezil (ARICEPT) 10 MG tablet Take 10 mg by mouth at bedtime.    [provider]  labetalol (NORMODYNE) 200 MG tablet Take 200 mg by mouth 2 (two) times daily. 07/08/15   [provider]  levothyroxine (SYNTHROID) 25 MCG tablet Take 25 mcg by mouth daily before breakfast.    [provider]  lisinopril-hydrochlorothiazide (PRINZIDE,ZESTORETIC) 20-12.5 MG per tablet Take 1 tablet by mouth 2 (two) times daily.     [provider]  Multiple Vitamin (MULTIVITAMIN WITH MINERALS) TABS tablet Take 1 tablet by mouth daily.    [provider]  Multiple Vitamins-Minerals (PRESERVISION AREDS PO) Take 1 tablet by mouth daily.     [provider]  NON FORMULARY B-6 vitamin    [provider]  rosuvastatin (CRESTOR) 20 MG tablet Take 20 mg by mouth daily.    [provider]  TRESIBA FLEXTOUCH 100 UNIT/ML FlexTouch Pen Inject 36 Units into the skin in the morning.  07/27/19   [provider]     Allergies:   No Known Allergies  Social History:   Social History   Socioeconomic History  . Marital  status: Married    Spouse name: Derrick [name]   . Number of children: 2  . Years of education: masters  . Highest education level: Not on file  Occupational History    Comment: Retired.  Tobacco Use  . Smoking status: Former Smoker    Packs/day: 0.25    Years: 15.00    Pack years: 3.75    Types: Cigarettes    Quit date: 06/23/1983    Years since quitting: 37.0  . Smokeless tobacco: Never Used  Vaping Use  . Vaping Use: Never used  Substance and Sexual Activity  . Alcohol use: No    Alcohol/week: 0.0 standard drinks  . Drug use: No  . Sexual activity: Yes  Other Topics Concern  . Not on file  Social History Narrative   Patient lives at home with his wife Derrick [name]).   Retired.   Education Master's degree.   Right handed.   Caffeine three cups of coffee daily.   Social Determinants of Health   Financial Resource Strain: Not on file  Food Insecurity: Not on file  Transportation Needs: Not on file  Physical Activity: Not on file  Stress: Not on file  Social Connections: Not on file  Intimate Partner Violence: Not on file    Family History:   The patient's family history includes Diabetes in his daughter; Heart disease in his father; High blood pressure in his father. There is no history of Colon cancer, Stomach cancer, Pancreatic cancer, or Esophageal cancer.    ROS:  Please see the history of present illness.  All other ROS reviewed and negative.     Physical Exam/Data:   Vitals:   07/19/20 1745 07/19/20 1800 07/19/20 1815 07/19/20 1830  BP: 123/60 127/78 122/64 133/72  Pulse: 67 66 70 71  Resp: (!) 28 (!) 31 14 (!) 28  Temp:      SpO2: 97% 99% 97% 96%   No intake or output data in the 24 hours ending 07/19/20 1900 Last 3 Weights 03/01/2020 01/03/2020 10/11/2019  Weight (lbs) 178 lb 179 lb 172 lb  Weight (kg) 80.74 kg 81.194 kg 78.019 kg     There is no height or weight on file to calculate BMI.  General:  Well nourished, well developed, in no acute distress HEENT:  normal Lymph: no adenopathy Neck: noJVD Endocrine:  No thryomegaly Vascular: No carotid bruits; FA pulses 2+ bilaterally without bruits  Cardiac:  normal S1, S2; RRR; no murmur  Lungs:  clear to auscultation bilaterally, no wheezing, rhonchi or rales  Abd: soft, nontender, no hepatomegaly  Ext: no edema Musculoskeletal:  No deformities, BUE and BLE strength normal and equal Skin: warm and dry  Neuro:  CNs 2-12 intact, no focal abnormalities noted Psych:  Normal affect     Laboratory Data:  High Sensitivity Troponin:   Recent Labs  Lab 07/19/20 1517  TROPONINIHS 7      Chemistry Recent Labs  Lab 07/19/20 1450  NA 141  K 5.5*  CL 105  CO2 27  GLUCOSE 91  BUN 18  CREATININE 1.13  CALCIUM 8.6*  GFRNONAA >60  ANIONGAP 9    Recent Labs  Lab 07/19/20 1450  PROT 6.3*  ALBUMIN 3.3*  AST 104*  ALT 37  ALKPHOS 77  BILITOT 1.5*   Hematology Recent Labs  Lab 07/19/20 1450  WBC 10.8*  RBC 4.64  HGB 14.4  HCT 43.1  MCV 92.9  MCH 31.0  MCHC 33.4  RDW 13.2  PLT 185   BNPNo results for input(s): BNP, PROBNP in the last 168 hours.  DDimer No results for input(s): DDIMER in the last 168 hours.   Radiology/Studies:  DG Chest Portable 1 View  Result Date: 07/19/2020 CLINICAL DATA:  Near syncope EXAM: PORTABLE CHEST 1 VIEW COMPARISON:  06/17/2016 FINDINGS: Heart size upper normal. Vascularity normal. Lungs clear without infiltrate or effusion. Normal lung volumes. Cardiac loop recorder. IMPRESSION: No active disease. Electronically Signed   By: Franchot Gallo M.D.   On: 07/19/2020 16:02     Assessment and Plan:   1. Syncope - history of unexplained syncope, followed by EP as outpatient, has loop recorder - episode today without warning signs. Corresponded to loop check with a 3 second and 7 second pause.  -He is on labetalol 200mg  bid, will hold and monitor rhythm overnight. - if no significant pauses would discharge tomorrow and continue to monitor further  with loop as outpatient. If recurrent as oupatient would require pacemaker. If recurrent pauses while inpatient off labetalol would need consideration for inpatient pacemaker - check TSH    2. Hyperkalemia - will hold home ACEI and monitor. Night high enough for medical intervention at this time.    3. Elevated LFTs - mild AST elevation, has been noted on prior labs. Mild Tbil elevation - denies any abdominal symptoms - repeat hepatic panel in AM, if further uptrend consider RUQ [place]  4. Dementia - poor historian though remains physically active, lives at home with his wife.   Risk Assessment/Risk Scores:        Severity of Illness: The appropriate patient status for this patient is OBSERVATION. Observation status is judged to be reasonable and necessary in order to provide the required intensity of service to ensure the patient's safety. The patient's presenting symptoms, physical exam findings, and initial radiographic and laboratory data in the context of their medical condition is felt to place them at decreased risk for further clinical deterioration. Furthermore, it is anticipated that the patient will be medically stable for discharge from the hospital within 2 midnights of admission. The following factors support the patient status of observation.   " The patient's presenting symptoms include syncope, sinus pause. " The physical exam findings include no acute ditress " The initial radiographic and laboratory data are loop recorder with 3 and 7 second pause.     For questions or updates, please contact [place] Please consult [email or web address] for contact info under     Signed, Carlyle Dolly, MD  07/19/2020 7:00 PM

## 2020-07-19 NOTE — ED Triage Notes (Addendum)
Pt BIBA while at the grocery store having a near syncopal episode while pushing the grocery cart. Wife lowered the patient to the floor. No LOC. Pt denies striking his head. No neck pain. Pt states he doesn't feel right and he is tired. Hx dementia. Pt denies pain. Capillary glucose 119. Patient has occasional facial grimacing which is the patient's baseline.

## 2020-07-19 NOTE — ED Notes (Signed)
Urine culture sent to lab.

## 2020-07-20 ENCOUNTER — Encounter (HOSPITAL_COMMUNITY): Payer: Self-pay | Admitting: Cardiology

## 2020-07-20 ENCOUNTER — Other Ambulatory Visit: Payer: Self-pay

## 2020-07-20 DIAGNOSIS — I455 Other specified heart block: Secondary | ICD-10-CM

## 2020-07-20 DIAGNOSIS — R55 Syncope and collapse: Secondary | ICD-10-CM | POA: Diagnosis not present

## 2020-07-20 LAB — COMPREHENSIVE METABOLIC PANEL
ALT: 26 U/L (ref 0–44)
AST: 24 U/L (ref 15–41)
Albumin: 3.2 g/dL — ABNORMAL LOW (ref 3.5–5.0)
Alkaline Phosphatase: 80 U/L (ref 38–126)
Anion gap: 9 (ref 5–15)
BUN: 15 mg/dL (ref 8–23)
CO2: 29 mmol/L (ref 22–32)
Calcium: 8.7 mg/dL — ABNORMAL LOW (ref 8.9–10.3)
Chloride: 104 mmol/L (ref 98–111)
Creatinine, Ser: 0.98 mg/dL (ref 0.61–1.24)
GFR, Estimated: >60 mL/min (ref >60)
Glucose, Bld: 155 mg/dL — ABNORMAL HIGH (ref 70–99)
Potassium: 2.9 mmol/L — ABNORMAL LOW (ref 3.5–5.1)
Sodium: 142 mmol/L (ref 135–145)
Total Bilirubin: 0.6 mg/dL (ref 0.3–1.2)
Total Protein: 6.3 g/dL — ABNORMAL LOW (ref 6.5–8.1)

## 2020-07-20 LAB — CUP PACEART REMOTE DEVICE CHECK
Date Time Interrogation Session: 20220128104641
Implantable Pulse Generator Implant Date: 20210714

## 2020-07-20 LAB — MAGNESIUM: Magnesium: 2.3 mg/dL (ref 1.7–2.4)

## 2020-07-20 LAB — CBG MONITORING, ED
Glucose-Capillary: 167 mg/dL — ABNORMAL HIGH (ref 70–99)
Glucose-Capillary: 145 mg/dL — ABNORMAL HIGH (ref 70–99)

## 2020-07-20 LAB — SARS CORONAVIRUS 2 (TAT 6-24 HRS): SARS Coronavirus 2: NEGATIVE

## 2020-07-20 LAB — GLUCOSE, CAPILLARY: Glucose-Capillary: 112 mg/dL — ABNORMAL HIGH (ref 70–99)

## 2020-07-20 MED ORDER — INSULIN ASPART 100 UNIT/ML ~~LOC~~ SOLN
0.0000 [IU] | Freq: Three times a day (TID) | SUBCUTANEOUS | Status: DC
  Administered 2020-07-20: 2 [IU] via SUBCUTANEOUS
  Administered 2020-07-21: 5 [IU] via SUBCUTANEOUS

## 2020-07-20 MED ORDER — POTASSIUM CHLORIDE CRYS ER 20 MEQ PO TBCR
30.0000 meq | EXTENDED_RELEASE_TABLET | ORAL | Status: AC
  Administered 2020-07-20 (×2): 30 meq via ORAL
  Filled 2020-07-20 (×2): qty 1

## 2020-07-20 MED ORDER — INSULIN GLARGINE 100 UNIT/ML ~~LOC~~ SOLN
33.0000 [IU] | Freq: Every day | SUBCUTANEOUS | Status: DC
  Administered 2020-07-21: 10 [IU] via SUBCUTANEOUS
  Filled 2020-07-20: qty 0.33

## 2020-07-20 NOTE — Progress Notes (Signed)
Progress Note  Patient Name: Derrick [name] Date of Encounter: 07/20/2020  [facility] Cardiologist: Jacqulyn Ducking  Subjective   Feeling well without complaint  Inpatient Medications    Scheduled Meds: . amLODipine  10 mg Oral QPM  . vitamin C  1,000 mg Oral Daily  . aspirin EC  81 mg Oral Daily  . donepezil  10 mg Oral QHS  . heparin  5,000 Units Subcutaneous Q8H  . hydrochlorothiazide  12.5 mg Oral Daily  . insulin glargine  36 Units Subcutaneous q AM  . levothyroxine  25 mcg Oral QAC breakfast  . multivitamin with minerals  1 tablet Oral Daily  . QUEtiapine  25 mg Oral Once   Continuous Infusions:  PRN Meds: acetaminophen, nitroGLYCERIN, ondansetron (ZOFRAN) IV   Vital Signs    Vitals:   07/20/20 0200 07/20/20 0600 07/20/20 0700 07/20/20 1019  BP: (!) 141/72 125/64 120/78 116/62  Pulse: 85 71 72 75  Resp: (!) 28 18 17 20   Temp:    98.4 F (36.9 C)  TempSrc:    Oral  SpO2: 94% 96% 91% 94%    Intake/Output Summary (Last 24 hours) at 07/20/2020 1219 Last data filed at 07/19/2020 2303 Gross per 24 hour  Intake --  Output 275 ml  Net -275 ml   Last 3 Weights 03/01/2020 01/03/2020 10/11/2019  Weight (lbs) 178 lb 179 lb 172 lb  Weight (kg) 80.74 kg 81.194 kg 78.019 kg      Telemetry    Sinus rhythm - Personally Reviewed  ECG    Sinus rhythm - Personally Reviewed  Physical Exam   GEN: No acute distress.   Neck: No JVD Cardiac: RRR, no murmurs, rubs, or gallops.  Respiratory: Clear to auscultation bilaterally. GI: Soft, nontender, non-distended  MS: No edema; No deformity. Neuro:  Nonfocal  Psych: Normal affect   Labs    High Sensitivity Troponin:   Recent Labs  Lab 07/19/20 1517 07/19/20 2209  TROPONINIHS 7 9      Chemistry Recent Labs  Lab 07/19/20 1450 07/19/20 2209 07/20/20 0428  NA 141  --  142  K 5.5*  --  2.9*  CL 105  --  104  CO2 27  --  29  GLUCOSE 91  --  155*  BUN 18  --  15  CREATININE 1.13 1.02 0.98   CALCIUM 8.6*  --  8.7*  PROT 6.3*  --  6.3*  ALBUMIN 3.3*  --  3.2*  AST 104*  --  24  ALT 37  --  26  ALKPHOS 77  --  80  BILITOT 1.5*  --  0.6  GFRNONAA >60 >60 >60  ANIONGAP 9  --  9     Hematology Recent Labs  Lab 07/19/20 1450 07/19/20 2209  WBC 10.8* 8.3  RBC 4.64 4.35  HGB 14.4 13.5  HCT 43.1 40.2  MCV 92.9 92.4  MCH 31.0 31.0  MCHC 33.4 33.6  RDW 13.2 12.9  PLT 185 172    BNPNo results for input(s): BNP, PROBNP in the last 168 hours.   DDimer No results for input(s): DDIMER in the last 168 hours.   Radiology    DG Chest Portable 1 View  Result Date: 07/19/2020 CLINICAL DATA:  Near syncope EXAM: PORTABLE CHEST 1 VIEW COMPARISON:  06/17/2016 FINDINGS: Heart size upper normal. Vascularity normal. Lungs clear without infiltrate or effusion. Normal lung volumes. Cardiac loop recorder. IMPRESSION: No active disease. Electronically Signed   By: Juanda Crumble  Carlis Abbott M.D.   On: 07/19/2020 16:02   CUP PACEART REMOTE DEVICE CHECK  Result Date: 07/20/2020 ILR summary report received. Battery status OK. Normal device function. No new symptom, tachy, brady, or pause episodes. No new AF episodes. Monthly summary reports and ROV/PRN   Cardiac Studies   TTE 09/19/19 1. Left ventricular ejection fraction, by estimation, is 60 to 65%. The  left ventricle has normal function. The left ventricle has no regional  wall motion abnormalities. There is mild left ventricular hypertrophy.  Left ventricular diastolic parameters  are indeterminate.  2. Right ventricular systolic function is normal. The right ventricular  size is normal. Tricuspid regurgitation signal is inadequate for assessing  PA pressure.  3. The mitral valve is normal in structure. No evidence of mitral valve  regurgitation.  4. The aortic valve was not well visualized. Aortic valve regurgitation  is mild. No aortic stenosis is present.   Patient Profile     80 y.o. male with a history of syncope s/p LINQ,  dementia, HTN< DM2, aortic regurgitation, carotid stenosis being seen for syncope.  Assessment & Plan    1. Syncope: Patient had a 3 and a 7 second pause on LINQ monitor. Labetalol has been stopped. No further pauses on telemetry. If he continues to do well over the next few hours, plan for discharge to home.  2. Dementia: patients wife says he has been sleeping quite a bit in the hospital. May be a component of delerium. Takyia Sindt have nursing get him up and about in the ER if possible. Likely discharge if the wife feels comfortable.  For questions or updates, please contact [place] Please consult [email or web address] for contact info under        Signed, Caliegh Middlekauff Meredith Leeds, MD  07/20/2020, 12:19 PM

## 2020-07-20 NOTE — Evaluation (Signed)
Physical Therapy Evaluation Patient Details Name: Derrick [name] MRN: [id number] DOB: 1941/02/04 Today's Date: 07/20/2020   History of Present Illness  Derrick [name] 80 yo male history of unexplained syncope followed by EP with loop recorder, dementia, HTN, DM2, hyperlipidemia, aortic regurgitation, carotid stenosis, admitted with syncope     Prior history of unexplained syncope however last episode prior to admission was Feb 2021. Today was Derrick [name] with his wife walking with shopping cart. Wife turned to pick an item from shelf, turned around and caught him falling to the floor with altered consciousness. Reports LOC for just a few seconds. No prodrome or prior symptoms. Had felt well leading up to episode.  Clinical Impression  Patient is agreeable to PT assessment. Wife and daughter present. Patient is slow to respond at times. Facial tics which are normal for patient. He is mod independent with bed mobility, transfers. Supervision for ambulation with RW 300 feet. Veers to left and right at times, but overall steady and no difficulties reported. Patient appears to be at baseline level of function with no repeat of symptoms during assessment. Patient does not require follow up PT at this time. Will sign off.          Follow Up Recommendations No PT follow up    Equipment Recommendations  None recommended by PT    Recommendations for Other Services       Precautions / Restrictions Precautions Precautions: Fall Restrictions Weight Bearing Restrictions: No      Mobility  Bed Mobility Overal bed mobility: Modified Independent                  Transfers Overall transfer level: Modified independent Equipment used: Rolling walker (2 wheeled)                Ambulation/Gait Ambulation/Gait assistance: Supervision Gait Distance (Feet): 300 Feet Assistive device: Rolling walker (2 wheeled) Gait Pattern/deviations: Step-through pattern;Drifts right/left Gait  velocity: normal   General Gait Details: generally safe with RW, took a few steps in room without ad, no lob.  Stairs            Wheelchair Mobility    Modified Rankin (Stroke Patients Only)       Balance Overall balance assessment: Modified Independent                                           Pertinent Vitals/Pain Pain Assessment: No/denies pain    Home Living Family/patient expects to be discharged to:: Private residence Living Arrangements: Spouse/significant other Available Help at Discharge: Family;Available 24 hours/day Type of Home: Apartment Home Access: Level entry;Elevator     Home Layout: One level Home Equipment: Walker - 2 wheels;Cane - single point;Grab bars - tub/shower;Grab bars - toilet      Prior Function Level of Independence: Independent         Comments: Patient reports he does not use ad at baseline. Does not dirve anymore.     Hand Dominance        Extremity/Trunk Assessment   Upper Extremity Assessment Upper Extremity Assessment: Overall WFL for tasks assessed    Lower Extremity Assessment Lower Extremity Assessment: Overall WFL for tasks assessed    Cervical / Trunk Assessment Cervical / Trunk Assessment: Normal  Communication   Communication: No difficulties  Cognition Arousal/Alertness: Awake/alert Behavior During Therapy: WFL for tasks assessed/performed Overall Cognitive Status:  Within Functional Limits for tasks assessed                                 General Comments: slow to respond at times.      General Comments      Exercises     Assessment/Plan    PT Assessment Patent does not need any further PT services  PT Problem List         PT Treatment Interventions      PT Goals (Current goals can be found in the Care Plan section)  Acute Rehab PT Goals Patient Stated Goal: to return home PT Goal Formulation: With patient/family Time For Goal Achievement:  07/26/20 Potential to Achieve Goals: Good    Frequency     Barriers to discharge        Co-evaluation               AM-PAC PT "6 Clicks" Mobility  Outcome Measure Help needed turning from your back to your side while in a flat bed without using bedrails?: None Help needed moving from lying on your back to sitting on the side of a flat bed without using bedrails?: None Help needed moving to and from a bed to a chair (including a wheelchair)?: None Help needed standing up from a chair using your arms (e.g., wheelchair or bedside chair)?: None Help needed to walk in hospital room?: A Little Help needed climbing 3-5 steps with a railing? : A Little 6 Click Score: 22    End of Session Equipment Utilized During Treatment: Gait belt Activity Tolerance: Patient tolerated treatment well Patient left: in bed;with call bell/phone within reach;with bed alarm set;with family/visitor present Nurse Communication: Mobility status PT Visit Diagnosis: History of falling (Z91.81)    Time: 1435-1500 PT Time Calculation (min) (ACUTE ONLY): 25 min   Charges:   PT Evaluation $PT Eval Moderate Complexity: 1 Mod PT Treatments $Gait Training: 8-22 mins        [employer], PT, [facility] 07/20/20,3:07 PM

## 2020-07-20 NOTE — Progress Notes (Signed)
Patient transferred from ED at 1326hrs.  Oriented to unit and plan of care for shift.  Patient confused to time and place, but calm and cooperative.  Wife at bedside, updated on plan of care for shift.

## 2020-07-20 NOTE — ED Notes (Signed)
Pt moved from bed to recliner. Pt able to bear own weight but requires 2 assist for safety. Pt shuffled several steps to recliner and sat down on his own power. Pt currently resting comfortably in recliner

## 2020-07-20 NOTE — Progress Notes (Addendum)
Called for SSI orders which I placed. On chart review K also noted to come back 2.9. Was 5.5 yesterday and received dose of Lokelma. Historically he tends to be hypokalemic. Reviewed with Dr. Curt Bears. The patient's wife was extremely hesitant for him to be discharged today pending another day of observation. Per our discussion we'll replete potassium with 33meq KCl x 2 doses only with reassessment in AM prior to DC.  I also requested clarification on his home Tresiba dose, listed as 33 units "sliding scale" per home med rec. Ordered as Lantus 36 units here. Daughter is not sure what he takes at home so they plan to call patient's wife for further clarification. I temporarily paused the Lantus order so we can reorder when we have this information - next dose due in AM.  Addendum: wife clarified [name] is 33 units daily. I spoke with pharmacy who reported there is a 1:1 conversion between [place] and formulary Lantus therefore will adjust order for AM. Melina Copa PA-C

## 2020-07-20 NOTE — ED Notes (Signed)
Tele  Breakfast Ordered 

## 2020-07-21 DIAGNOSIS — R55 Syncope and collapse: Secondary | ICD-10-CM | POA: Diagnosis not present

## 2020-07-21 DIAGNOSIS — I455 Other specified heart block: Secondary | ICD-10-CM | POA: Diagnosis not present

## 2020-07-21 LAB — BASIC METABOLIC PANEL
Anion gap: 11 (ref 5–15)
BUN: 17 mg/dL (ref 8–23)
CO2: 28 mmol/L (ref 22–32)
Calcium: 8.7 mg/dL — ABNORMAL LOW (ref 8.9–10.3)
Chloride: 104 mmol/L (ref 98–111)
Creatinine, Ser: 1.03 mg/dL (ref 0.61–1.24)
GFR, Estimated: >60 mL/min (ref >60)
Glucose, Bld: 69 mg/dL — ABNORMAL LOW (ref 70–99)
Potassium: 3.3 mmol/L — ABNORMAL LOW (ref 3.5–5.1)
Sodium: 143 mmol/L (ref 135–145)

## 2020-07-21 LAB — GLUCOSE, CAPILLARY
Glucose-Capillary: 197 mg/dL — ABNORMAL HIGH (ref 70–99)
Glucose-Capillary: 155 mg/dL — ABNORMAL HIGH (ref 70–99)
Glucose-Capillary: 51 mg/dL — ABNORMAL LOW (ref 70–99)
Glucose-Capillary: 119 mg/dL — ABNORMAL HIGH (ref 70–99)
Glucose-Capillary: 64 mg/dL — ABNORMAL LOW (ref 70–99)
Glucose-Capillary: 279 mg/dL — ABNORMAL HIGH (ref 70–99)

## 2020-07-21 MED ORDER — LISINOPRIL 20 MG PO TABS
20.0000 mg | ORAL_TABLET | Freq: Every day | ORAL | Status: DC
  Administered 2020-07-21: 20 mg via ORAL
  Filled 2020-07-21: qty 1

## 2020-07-21 MED ORDER — LISINOPRIL 40 MG PO TABS
40.0000 mg | ORAL_TABLET | Freq: Every day | ORAL | Status: DC
  Filled 2020-07-21: qty 1

## 2020-07-21 MED ORDER — POTASSIUM CHLORIDE CRYS ER 20 MEQ PO TBCR
30.0000 meq | EXTENDED_RELEASE_TABLET | ORAL | Status: AC
  Administered 2020-07-21 (×3): 30 meq via ORAL
  Filled 2020-07-21 (×3): qty 1

## 2020-07-21 MED ORDER — LISINOPRIL 40 MG PO TABS: 40.0000 mg | ORAL_TABLET | Freq: Every day | ORAL | 3 refills | Status: DC

## 2020-07-21 MED ORDER — LISINOPRIL 20 MG PO TABS: 20.0000 mg | ORAL_TABLET | Freq: Every day | ORAL | 3 refills | Status: DC

## 2020-07-21 NOTE — Discharge Instructions (Signed)
Blood pressure medication has changed. Please stop taking lisinopril-HCTZ twice daily and labetalol twice daily.   He will now take 10 mg amlodipine as usual. We will start 20 mg lisinopril daily.   If his blood pressure is significantly above 140/90s on this regimen, please call our office for further guidance - [phone].

## 2020-07-21 NOTE — Progress Notes (Signed)
Hypoglycemic Event  CBG: 51  Treatment: Given orange juice and banana, then breakfast  Symptoms: patient denies  Follow-up CBG: Time:0847 and 0915 CBG Result: 64, 119  Possible Reasons for Event:recieved 36 units of lantus in ED yesterday morning, home dose is 33 units.   Comments/MD notified: A. Duke PA    Myrtis Hopping

## 2020-07-21 NOTE — Care Management (Signed)
3354 07-21-20 Case Manager received a referral from provider regarding wife being concerned about taking home due to increased needs. Case Manager called wife and she states they live art [place]. Wife states that the patient sits in the chair most days and he does not get up to use the bathroom. Patient uses depends in the home. Wife states she can support the patient's needs at this time. Case Manager did recommend that the wife reach out to [facility] when she feels that she can no longer take care of patient to see if they can assist with an Assisted Living bed vs a Skilled Nursing bed. Case Manager also made the wife aware that she can utilize personal care services(PCS) if needed at an added fee and where to find the PCS. Wife will provide transportation home in private vehicle. No further needs identified at this time. Bethena Roys, RN,BSN Case Manager

## 2020-07-21 NOTE — Progress Notes (Signed)
Progress Note  Patient Name: Derrick [name] Date of Encounter: 07/21/2020  [facility] Cardiologist: Jacqulyn Ducking  Subjective   Continues to sleep quite a bit since being in the hospital.  No further pauses on cardiac monitor.  Inpatient Medications    Scheduled Meds: . amLODipine  10 mg Oral QPM  . vitamin C  1,000 mg Oral Daily  . aspirin EC  81 mg Oral Daily  . donepezil  10 mg Oral QHS  . heparin  5,000 Units Subcutaneous Q8H  . hydrochlorothiazide  12.5 mg Oral Daily  . insulin aspart  0-9 Units Subcutaneous TID WC  . insulin glargine  33 Units Subcutaneous Daily  . levothyroxine  25 mcg Oral QAC breakfast  . multivitamin with minerals  1 tablet Oral Daily  . potassium chloride  30 mEq Oral Q1 Hr x 3  . QUEtiapine  25 mg Oral Once   Continuous Infusions:  PRN Meds: acetaminophen, nitroGLYCERIN, ondansetron (ZOFRAN) IV   Vital Signs    Vitals:   07/20/20 1500 07/20/20 1756 07/20/20 2025 07/21/20 0425  BP:  134/80 113/67 140/68  Pulse:  63 66 62  Resp:  18 19 15   Temp:  98.6 F (37 C) 97.8 F (36.6 C)   TempSrc:  Oral Oral   SpO2: 99% 97% 95% 93%  Weight:    79.5 kg  Height:        Intake/Output Summary (Last 24 hours) at 07/21/2020 0819 Last data filed at 07/21/2020 0500 Gross per 24 hour  Intake 240 ml  Output 1100 ml  Net -860 ml   Last 3 Weights 07/21/2020 07/20/2020 03/01/2020  Weight (lbs) 175 lb 3.2 oz 173 lb 1.6 oz 178 lb  Weight (kg) 79.47 kg 78.518 kg 80.74 kg      Telemetry    Sinus rhythm-personally reviewed  ECG    Sinus rhythm  Physical Exam   GEN: Well nourished, well developed, in no acute distress  HEENT: normal  Neck: no JVD, carotid bruits, or masses Cardiac: RRR; no murmurs, rubs, or gallops,no edema  Respiratory:  clear to auscultation bilaterally, normal work of breathing GI: soft, nontender, nondistended, + BS MS: no deformity or atrophy  Skin: warm and dry, device site well healed Neuro:  Strength and  sensation are intact Psych: euthymic mood, full affect    Labs    High Sensitivity Troponin:   Recent Labs  Lab 07/19/20 1517 07/19/20 2209  TROPONINIHS 7 9      Chemistry Recent Labs  Lab 07/19/20 1450 07/19/20 2209 07/20/20 0428 07/21/20 0359  NA 141  --  142 143  K 5.5*  --  2.9* 3.3*  CL 105  --  104 104  CO2 27  --  29 28  GLUCOSE 91  --  155* 69*  BUN 18  --  15 17  CREATININE 1.13 1.02 0.98 1.03  CALCIUM 8.6*  --  8.7* 8.7*  PROT 6.3*  --  6.3*  --   ALBUMIN 3.3*  --  3.2*  --   AST 104*  --  24  --   ALT 37  --  26  --   ALKPHOS 77  --  80  --   BILITOT 1.5*  --  0.6  --   GFRNONAA >60 >60 >60 >60  ANIONGAP 9  --  9 11     Hematology Recent Labs  Lab 07/19/20 1450 07/19/20 2209  WBC 10.8* 8.3  RBC 4.64 4.35  HGB 14.4 13.5  HCT 43.1 40.2  MCV 92.9 92.4  MCH 31.0 31.0  MCHC 33.4 33.6  RDW 13.2 12.9  PLT 185 172    BNPNo results for input(s): BNP, PROBNP in the last 168 hours.   DDimer No results for input(s): DDIMER in the last 168 hours.   Radiology    DG Chest Portable 1 View  Result Date: 07/19/2020 CLINICAL DATA:  Near syncope EXAM: PORTABLE CHEST 1 VIEW COMPARISON:  06/17/2016 FINDINGS: Heart size upper normal. Vascularity normal. Lungs clear without infiltrate or effusion. Normal lung volumes. Cardiac loop recorder. IMPRESSION: No active disease. Electronically Signed   By: Franchot Gallo M.D.   On: 07/19/2020 16:02   CUP PACEART REMOTE DEVICE CHECK  Result Date: 07/20/2020 ILR summary report received. Battery status OK. Normal device function. No new symptom, tachy, brady, or pause episodes. No new AF episodes. Monthly summary reports and ROV/PRN   Cardiac Studies   TTE 09/19/19 1. Left ventricular ejection fraction, by estimation, is 60 to 65%. The  left ventricle has normal function. The left ventricle has no regional  wall motion abnormalities. There is mild left ventricular hypertrophy.  Left ventricular diastolic parameters   are indeterminate.  2. Right ventricular systolic function is normal. The right ventricular  size is normal. Tricuspid regurgitation signal is inadequate for assessing  PA pressure.  3. The mitral valve is normal in structure. No evidence of mitral valve  regurgitation.  4. The aortic valve was not well visualized. Aortic valve regurgitation  is mild. No aortic stenosis is present.   Patient Profile     80 y.o. male with a history of syncope s/p LINQ, dementia, HTN< DM2, aortic regurgitation, carotid stenosis being seen for syncope.  Assessment & Plan    1. Syncope: Patient had syncope at the grocery store and was found to have a 3 and a 7-second pause on Linq monitor.  Labetalol has been stopped without further issues.  We Lukasz Rogus plan for discharge home today with continued Linq monitoring.    2. Dementia: Wife reports the patient has been sleeping quite a bit since being in the hospital.  This Neo Yepiz likely improve once he gets home.  I told her to get in touch with his primary physician to discuss this further if it continues to be an issue.  3.  Hypertension: Blood pressure is mildly elevated today.  Labetalol has been stopped.  He is also been hypokalemic which I am repleting today.  [place] discharge on lisinopril 40 mg and hold his HCTZ.  4.  Hypokalemia: Repletion today.  For questions or updates, please contact [place] Please consult [email or web address] for contact info under        Signed, Lucielle Vokes Meredith Leeds, MD  07/21/2020, 8:19 AM

## 2020-07-21 NOTE — Discharge Summary (Addendum)
Discharge Summary    Patient ID: Derrick [name] MRN: [id number]; DOB: [birth year]  Admit date: 07/19/2020 Discharge date: 07/21/2020  Primary Care Provider: Burnard Bunting, MD  Primary Cardiologist: Derrick Peru, MD  Primary Electrophysiologist:  None   Discharge Diagnoses    Principal Problem:   Syncope and collapse Active Problems:   OSA on CPAP   Diabetes mellitus ([place])   HTN (hypertension)   Hypokalemia   Carotid artery stenosis   Sinus pause   Diagnostic Studies/Procedures    TTE 09/19/19  1. Left ventricular ejection fraction, by estimation, is 60 to 65%. The  left ventricle has normal function. The left ventricle has no regional  wall motion abnormalities. There is mild left ventricular hypertrophy.  Left ventricular diastolic parameters  are indeterminate.   2. Right ventricular systolic function is normal. The right ventricular  size is normal. Tricuspid regurgitation signal is inadequate for assessing  PA pressure.   3. The mitral valve is normal in structure. No evidence of mitral valve  regurgitation.   4. The aortic valve was not well visualized. Aortic valve regurgitation  is mild. No aortic stenosis is present.  _____________   History of Present Illness     Derrick [name] is a 80 y.o. male with hx of unexplained syncope with ILR in place and followed by EP, dementia, HTN, DM2, HLD, aortic regurgitation, and carotid stenosis.   Prior history of unexplained syncope however last episode prior to admission was Feb 2021. Today was Derrick [name] with his wife walking with shopping cart. Wife turned to pick an item from shelf, turned around and caught him falling to the floor with altered consciousness. Reports LOC for just a few seconds. No prodrome or prior symptoms. Had felt well leading up to episode.    WBC 10.8 Hgb 14.4 K 5.5 Cr 1.13 Trop 7 CXR no acute process EKG NSR 08/2019 echo LVEF 60-65%, no WMAs, indet dd, normal RV function, mild  AI.   Hospital Course     Consultants: none  Syncope Loop recorder did show a 3 second and 7 second pause. Home 200 mg labetalol BID was held overnight and he was monitored on telemetry. Telemetry did not show any additional pauses. He was not discharged on HD 1 due to sleeping excessively and changes in potassium. EP to follow ILR for additional pauses of BB. No indication for PPM at this time.    Hyperkalemia Home ACEI was held. Pt was initially hyperkalemic at 5.5 and received lokelma. Repeat K was 2.9 - replaced with oral potassium. BMP this morning with K 3.3 and sCr 1.03. Resume lisinopril at 40 mg and hold HCTZ. Repeat a BMP at follow up.    Hypertension Derrick [name] resume 40 mg lisinopril and hold home HCTZ. Derrick [name] continue holding labetalol. Continue home amlodipine 10 mg. Monitor at home - call office with hypotnesion or hypertension.   Elevated LFTs Mild AST elevation noted on prior labs. Mild Tbili elevation noted.   Dementia Poor historian, but physically active. Lives at home with is wife.     Did the patient have an acute coronary syndrome (MI, NSTEMI, STEMI, etc) this admission?:  No                               Did the patient have a percutaneous coronary intervention (stent / angioplasty)?:  No.       _____________  Discharge Vitals Blood pressure 140/68, pulse  62, temperature 97.8 F (36.6 C), temperature source Oral, resp. rate 15, height 5\' 7"  (1.702 m), weight 79.5 kg, SpO2 93 %.  Filed Weights   07/20/20 1326 07/21/20 0425  Weight: 78.5 kg 79.5 kg    Labs & Radiologic Studies    CBC Recent Labs    07/19/20 1450 07/19/20 2209  WBC 10.8* 8.3  NEUTROABS 8.7*  --   HGB 14.4 13.5  HCT 43.1 40.2  MCV 92.9 92.4  PLT 185 [id number]   Basic Metabolic Panel Recent Labs    07/20/20 0428 07/21/20 0359  NA 142 143  K 2.9* 3.3*  CL 104 104  CO2 29 28  GLUCOSE 155* 69*  BUN 15 17  CREATININE 0.98 1.03  CALCIUM 8.7* 8.7*  MG 2.3  --    Liver Function  Tests Recent Labs    07/19/20 1450 07/20/20 0428  AST 104* 24  ALT 37 26  ALKPHOS 77 80  BILITOT 1.5* 0.6  PROT 6.3* 6.3*  ALBUMIN 3.3* 3.2*   No results for input(s): LIPASE, AMYLASE in the last 72 hours. High Sensitivity Troponin:   Recent Labs  Lab 07/19/20 1517 07/19/20 2209  TROPONINIHS 7 9    BNP Invalid input(s): POCBNP D-Dimer No results for input(s): DDIMER in the last 72 hours. Hemoglobin A1C No results for input(s): HGBA1C in the last 72 hours. Fasting Lipid Panel No results for input(s): CHOL, HDL, LDLCALC, TRIG, CHOLHDL, LDLDIRECT in the last 72 hours. Thyroid Function Tests Recent Labs    07/19/20 2209  TSH 4.697*   _____________  DG Chest Portable 1 View  Result Date: 07/19/2020 CLINICAL DATA:  Near syncope EXAM: PORTABLE CHEST 1 VIEW COMPARISON:  06/17/2016 FINDINGS: Heart size upper normal. Vascularity normal. Lungs clear without infiltrate or effusion. Normal lung volumes. Cardiac loop recorder. IMPRESSION: No active disease. Electronically Signed   By: Derrick [name] M.D.   On: 07/19/2020 16:02   CUP PACEART REMOTE DEVICE CHECK  Result Date: 07/20/2020 ILR summary report received. Battery status OK. Normal device function. No new symptom, tachy, brady, or pause episodes. No new AF episodes. Monthly summary reports and ROV/PRN  Disposition   Pt is being discharged home today in good condition.  Follow-up Plans & Appointments     Follow-up Information     Derrick Lance, MD Follow up in 2 week(s).   Specialty: Cardiology Why: office Derrick [name] call with appt Contact information: [id number] [address] [phone]                Discharge Instructions     Diet - low sodium heart healthy   Complete by: As directed    Increase activity slowly   Complete by: As directed        Discharge Medications   Allergies as of 07/21/2020   No Known Allergies      Medication List     STOP taking these  medications    lisinopril-hydrochlorothiazide 20-12.5 MG tablet Commonly known as: ZESTORETIC       TAKE these medications    amLODipine 10 MG tablet Commonly known as: NORVASC Take 10 mg by mouth every evening.   aspirin EC 81 MG tablet Take 81 mg by mouth daily.   donepezil 10 MG tablet Commonly known as: ARICEPT Take 10 mg by mouth at bedtime.   levothyroxine 25 MCG tablet Commonly known as: SYNTHROID Take 25 mcg by mouth daily before breakfast.   lisinopril 40 MG tablet Commonly  known as: ZESTRIL Take 1 tablet (40 mg total) by mouth daily.   multivitamin with minerals Tabs tablet Take 1 tablet by mouth daily.   NON FORMULARY Take 1 tablet by mouth daily. B-6 vitamin   PRESERVISION AREDS PO Take 1 tablet by mouth daily.   rosuvastatin 20 MG tablet Commonly known as: CRESTOR Take 20 mg by mouth daily.   [name] FlexTouch 100 UNIT/ML FlexTouch Pen Generic drug: insulin degludec Inject 33 Units into the skin in the morning. Per sliding scale   vitamin C 1000 MG tablet Take 1,000 mg by mouth daily.           Outstanding Labs/Studies   Need BMP in 2 weeks at follow up  Duration of Discharge Encounter   Greater than 30 minutes including physician time.  Signed, Tami Lin Duke, PA 07/21/2020, 9:18 AM    I have seen and examined this patient with [place].  Agree with above, note added to reflect my findings.  On exam, RRR, no murmurs, lungs clear.  Patient had episode of syncope.  Was found to have a 3 and a 7-second pause.  Labetalol was held and no further pauses were noted.  We Tami Barren plan to discharge today.  He also was hypokalemic and Dollene Mallery stop his HCTZ.  Further blood pressure can be monitored as an outpatient.  Cobain Morici M. Desree Leap MD 07/21/2020 10:08 AM

## 2020-07-21 NOTE — Plan of Care (Signed)

## 2020-07-22 ENCOUNTER — Ambulatory Visit (INDEPENDENT_AMBULATORY_CARE_PROVIDER_SITE_OTHER): Payer: Medicare PPO

## 2020-07-22 DIAGNOSIS — R55 Syncope and collapse: Secondary | ICD-10-CM | POA: Diagnosis not present

## 2020-07-22 LAB — GLUCOSE, CAPILLARY: Glucose-Capillary: 111 mg/dL — ABNORMAL HIGH (ref 70–99)

## 2020-07-30 NOTE — Progress Notes (Signed)
Carelink Summary Report / Loop Recorder 

## 2020-08-02 ENCOUNTER — Encounter: Payer: Self-pay | Admitting: Physician Assistant

## 2020-08-02 ENCOUNTER — Ambulatory Visit (INDEPENDENT_AMBULATORY_CARE_PROVIDER_SITE_OTHER): Payer: Medicare PPO | Admitting: Physician Assistant

## 2020-08-02 ENCOUNTER — Other Ambulatory Visit: Payer: Self-pay

## 2020-08-02 VITALS — BP 130/70 | HR 76 | Ht 67.0 in | Wt 174.0 lb

## 2020-08-02 DIAGNOSIS — R55 Syncope and collapse: Secondary | ICD-10-CM

## 2020-08-02 DIAGNOSIS — I10 Essential (primary) hypertension: Secondary | ICD-10-CM

## 2020-08-02 DIAGNOSIS — Z4509 Encounter for adjustment and management of other cardiac device: Secondary | ICD-10-CM | POA: Diagnosis not present

## 2020-08-02 DIAGNOSIS — I455 Other specified heart block: Secondary | ICD-10-CM | POA: Diagnosis not present

## 2020-08-02 DIAGNOSIS — Z79899 Other long term (current) drug therapy: Secondary | ICD-10-CM | POA: Diagnosis not present

## 2020-08-02 LAB — CUP PACEART INCLINIC DEVICE CHECK
Date Time Interrogation Session: 20220211170802
Implantable Pulse Generator Implant Date: 20210714

## 2020-08-02 NOTE — Patient Instructions (Signed)
Medication Instructions:   Your physician recommends that you continue on your current medications as directed. Please refer to the Current Medication list given to you today.  *If you need a refill on your cardiac medications before your next appointment, please call your pharmacy*   Lab Work: BMET TODAY   If you have labs (blood work) drawn today and your tests are completely normal, you will receive your results only by: Marland Kitchen MyChart Message (if you have MyChart) OR . A paper copy in the mail If you have any lab test that is abnormal or we need to change your treatment, we will call you to review the results.   Testing/Procedures:NONE ORDERED  TODAY     Follow-Up: At [facility], you and your health needs are our priority.  As part of our continuing mission to provide you with exceptional heart care, we have created designated Provider Care Teams.  These Care Teams include your primary Cardiologist (physician) and Advanced Practice Providers (APPs -  Physician Assistants and Nurse Practitioners) who all work together to provide you with the care you need, when you need it.  We recommend signing up for the patient portal called "MyChart".  Sign up information is provided on this After Visit Summary.  MyChart is used to connect with patients for Virtual Visits (Telemedicine).  Patients are able to view lab/test results, encounter notes, upcoming appointments, etc.  Non-urgent messages can be sent to your provider as well.   To learn more about what you can do with MyChart, go to [email or web address].    Your next appointment:   3 month(s)  The format for your next appointment:   In Person  Provider:   You may see Dr. Lovena Le  or one of the following Advanced Practice Providers on your designated Care Team:    Chanetta Marshall, NP  Tommye Standard, PA-C  Legrand Como "Jonni Sanger" Chalmers Cater, [place]    Other Instructions*

## 2020-08-02 NOTE — Progress Notes (Signed)
Cardiology Office Note Date:  08/02/2020  Patient ID:  Derrick [name], Derrick [name] 12/29/1940, MRN [id number] PCP:  Burnard Bunting, MD  Cardiologist/Electrophysiologist: Dr. Lovena Le    Chief Complaint:  post hospital   History of Present Illness: Derrick [name] is a 80 y.o. male with history of DM (hx of DKA), HTN, HLD, OSA w/CPAP, dementia, recurrent syncope.  He comes today to be seen for dr. Lovena Le, last seen by him July 2021.  He had not had further syncope since his last visit though planned for loop implant to continue evaluation into his syncope.  Had an ER visit Nov 2021 for a syncopal event, EMS was called, upon standing him up had a 15 second "seizure-like" episode, no prolonged post ictal state, given IVF.  Monitoring noted V trigemeny in route to the hospital Got his COVID booster the day prior, generally feeling weak the day of his event Labs largely unremarkable, UA neg for infection CT with chronic infarcts Loop interrogated without observations D/c from the ER   Admitted 07/19/20 with another syncopal event tha occurred while shopping, event was seconds in  Loop noted 2 pauses of 3 and 7 seconds His labetalol held He had mild hyperkalemia his home ACE held and given lokelma as well.  Mild abn LFTs noted historically. discharged 07/21/20 No further pause on telemetry, home ACE resumed, no HCTZ. To follow BP at home, and plane BMET at follow up. There was mention of the pt sleeping a lot in the hoapital by his wife's observation, suspected 2/2 to being in the hospital and monitor at home, f/u PMD.  TODAY He is accompanied by his wife. He has done well, no recurrent syncope since home. His sleeping pattern is at his baseline He denies any CP, palpitations, no SOB. Tolerating med changes well.  His wife mentions his memory is poor, the patient often defers to his wife to answer questions.   Device information MDT ILR, implanted 01/03/20   Past Medical  History:  Diagnosis Date  . Aortic regurgitation    mild by echo 08/2019  . Carotid artery stenosis    1-39% bilateral stenosis by dopplers 08/2019  . Cataract   . Complication of anesthesia    "high blood sugar; he's been loopy last 36h since OR"- 2016  . Diabetes mellitus   . DKA, type 2 ([place])   . Hearing loss   . Hypercholesteremia   . Hypertension   . Insulin pump in place   . Macular degeneration   . MCI (mild cognitive impairment) with memory loss 12/12/2014  . OSA on CPAP 09/11/2014  . PONV (postoperative nausea and vomiting)   . Uses hearing aid    right ear, patient stated he lost left hearing aid     Past Surgical History:  Procedure Laterality Date  . CHOLECYSTECTOMY N/A 10/28/2015   Procedure: LAPAROSCOPIC CHOLECYSTECTOMY WITH   INTRAOPERATIVE CHOLANGIOGRAM;  Surgeon: Greer Pickerel, MD;  Location: [facility]S;  Service: General;  Laterality: N/A;  . SHOULDER ARTHROSCOPY Right 08/24/11   "frozen shoulder; RCR w/labrum tear; arthritis scraping; bone spurs"  . SHOULDER ARTHROSCOPY Left ~ 2006  . TONSILLECTOMY  1949   "in childhood"    Current Outpatient Medications  Medication Sig Dispense Refill  . amLODipine (NORVASC) 10 MG tablet Take 10 mg by mouth every evening.     . Ascorbic Acid (VITAMIN C) 1000 MG tablet Take 1,000 mg by mouth daily.    Marland Kitchen aspirin EC 81 MG tablet Take 81  mg by mouth daily.    Marland Kitchen donepezil (ARICEPT) 10 MG tablet Take 10 mg by mouth at bedtime.    Marland Kitchen levothyroxine (SYNTHROID) 25 MCG tablet Take 25 mcg by mouth daily before breakfast.    . lisinopril (ZESTRIL) 20 MG tablet Take 1 tablet (20 mg total) by mouth daily. 90 tablet 3  . Multiple Vitamin (MULTIVITAMIN WITH MINERALS) TABS tablet Take 1 tablet by mouth daily.    . Multiple Vitamins-Minerals (PRESERVISION AREDS PO) Take 1 tablet by mouth daily.     . NON FORMULARY Take 1 tablet by mouth daily. B-6 vitamin    . rosuvastatin (CRESTOR) 20 MG tablet Take 20 mg by mouth daily.    [name] FLEXTOUCH 100  UNIT/ML FlexTouch Pen Inject 33 Units into the skin in the morning. Per sliding scale     No current facility-administered medications for this visit.    Allergies:   Patient has no known allergies.   Social History:  The patient  reports that he quit smoking about 37 years ago. His smoking use included cigarettes. He has a 3.75 pack-year smoking history. He has never used smokeless tobacco. He reports that he does not drink alcohol and does not use drugs.   Family History:  The patient's family history includes Diabetes in his daughter; Heart disease in his father; High blood pressure in his father.  ROS:  Please see the history of present illness.    All other systems are reviewed and otherwise negative.   PHYSICAL EXAM:  VS:  BP 130/70 (BP Location: Left Arm, Patient Position: Sitting, Cuff Size: Normal)   Pulse 76   Ht 5\' 7"  (1.702 m)   Wt 174 lb (78.9 kg)   SpO2 98%   BMI 27.25 kg/m  BMI: Body mass index is 27.25 kg/m. Well nourished, well developed, in no acute distress HEENT: normocephalic, atraumatic Neck: no JVD, carotid bruits or masses Cardiac:  RRR; no significant murmurs, no rubs, or gallops Lungs:  CTA b/l, no wheezing, rhonchi or rales Abd: soft, nontender MS: no deformity or atrophy Ext: no edema Skin: warm and dry, no rash Neuro:  No gross deficits appreciated Psych: euthymic mood, full affect  ILR site is stable, no tethering or discomfort   EKG:  Not done today  Device interrogation done today and reviewed by myself:  Battery is good R waves 0.33mV No observation since 07/19/20    09/19/2019: TTE IMPRESSIONS  1. Left ventricular ejection fraction, by estimation, is 60 to 65%. The  left ventricle has normal function. The left ventricle has no regional  wall motion abnormalities. There is mild left ventricular hypertrophy.  Left ventricular diastolic parameters  are indeterminate.  2. Right ventricular systolic function is normal. The right  ventricular  size is normal. Tricuspid regurgitation signal is inadequate for assessing  PA pressure.  3. The mitral valve is normal in structure. No evidence of mitral valve  regurgitation.  4. The aortic valve was not well visualized. Aortic valve regurgitation  is mild. No aortic stenosis is present.    Recent Labs: 07/19/2020: Hemoglobin 13.5; Platelets 172; TSH 4.697 07/20/2020: ALT 26; Magnesium 2.3 07/21/2020: BUN 17; Creatinine, Ser 1.03; Potassium 3.3; Sodium 143  No results found for requested labs within last 8760 hours.   Estimated Creatinine Clearance: 56.2 mL/min (by C-G formula based on SCr of 1.03 mg/dL).   Wt Readings from Last 3 Encounters:  08/02/20 174 lb (78.9 kg)  07/21/20 175 lb 3.2 oz (79.5 kg)  03/01/20 178 lb (80.7 kg)     Other studies reviewed: Additional studies/records reviewed today include: summarized above  ASSESSMENT AND PLAN:  1. Syncope     Pauses >> labetalol stopped     No recurrent syncope     No brady/pause episodes, no episodes at all  I have provided them with a symptom activator and educated on use  2. HTN     Looks good  3. Hyperkalemia     BMET today  Disposition: F/u with monthly remotes as usual and in clinic in 40mo, sooner if needed.  Current medicines are reviewed at length with the patient today.  The patient did not have any concerns regarding medicines.  Venetia Night, PA-C 08/02/2020 5:03 PM     [address] [place] [address] [phone] (office)  [phone] (fax)

## 2020-08-03 LAB — BASIC METABOLIC PANEL
BUN/Creatinine Ratio: 16 (ref 10–24)
BUN: 18 mg/dL (ref 8–27)
CO2: 23 mmol/L (ref 20–29)
Calcium: 9.2 mg/dL (ref 8.6–10.2)
Chloride: 110 mmol/L — ABNORMAL HIGH (ref 96–106)
Creatinine, Ser: 1.14 mg/dL (ref 0.76–1.27)
GFR calc Af Amer: 71 mL/min/{1.73_m2} (ref >59)
GFR calc non Af Amer: 62 mL/min/{1.73_m2} (ref >59)
Glucose: 246 mg/dL — ABNORMAL HIGH (ref 65–99)
Potassium: 4 mmol/L (ref 3.5–5.2)
Sodium: 149 mmol/L — ABNORMAL HIGH (ref 134–144)

## 2020-08-26 ENCOUNTER — Ambulatory Visit (INDEPENDENT_AMBULATORY_CARE_PROVIDER_SITE_OTHER): Payer: Medicare PPO

## 2020-08-26 DIAGNOSIS — R55 Syncope and collapse: Secondary | ICD-10-CM | POA: Diagnosis not present

## 2020-08-28 LAB — CUP PACEART REMOTE DEVICE CHECK
Date Time Interrogation Session: 20220302105230
Implantable Pulse Generator Implant Date: 20210714

## 2020-09-03 NOTE — Progress Notes (Signed)
Carelink Summary Report / Loop Recorder 

## 2020-09-05 ENCOUNTER — Telehealth: Payer: Self-pay | Admitting: Emergency Medicine

## 2020-09-05 NOTE — Telephone Encounter (Signed)
Alert for pause recorded 09/04/20 at 1005. Spoke to [employer]) his wife who reports that he was asleep at that time. No syncopal or dizzy episodes. She reports he has increased issues with memory and is a poor historian concerning his health. Reassured [name] that we will contact her to check on patient in the future. t

## 2020-09-06 DIAGNOSIS — G4733 Obstructive sleep apnea (adult) (pediatric): Secondary | ICD-10-CM | POA: Diagnosis not present

## 2020-09-13 ENCOUNTER — Non-Acute Institutional Stay (SKILLED_NURSING_FACILITY): Payer: Medicare PPO | Admitting: Internal Medicine

## 2020-09-13 ENCOUNTER — Encounter: Payer: Self-pay | Admitting: Internal Medicine

## 2020-09-13 DIAGNOSIS — N1831 Chronic kidney disease, stage 3a: Secondary | ICD-10-CM

## 2020-09-13 DIAGNOSIS — Z794 Long term (current) use of insulin: Secondary | ICD-10-CM

## 2020-09-13 DIAGNOSIS — N390 Urinary tract infection, site not specified: Secondary | ICD-10-CM | POA: Diagnosis not present

## 2020-09-13 DIAGNOSIS — I10 Essential (primary) hypertension: Secondary | ICD-10-CM | POA: Diagnosis not present

## 2020-09-13 DIAGNOSIS — E1122 Type 2 diabetes mellitus with diabetic chronic kidney disease: Secondary | ICD-10-CM | POA: Diagnosis not present

## 2020-09-13 DIAGNOSIS — R4182 Altered mental status, unspecified: Secondary | ICD-10-CM

## 2020-09-13 DIAGNOSIS — R55 Syncope and collapse: Secondary | ICD-10-CM | POA: Diagnosis not present

## 2020-09-13 LAB — COMPREHENSIVE METABOLIC PANEL
Albumin: 3.4 — AB (ref 3.5–5.0)
Calcium: 8.6 — AB (ref 8.7–10.7)
Globulin: 3

## 2020-09-13 LAB — CBC AND DIFFERENTIAL
HCT: 47 (ref 41–53)
Hemoglobin: 15.9 (ref 13.5–17.5)
Neutrophils Absolute: 15252
Platelets: 252 (ref 150–399)
WBC: 18.2

## 2020-09-13 LAB — HEPATIC FUNCTION PANEL
ALT: 15 (ref 10–40)
AST: 19 (ref 14–40)
Alkaline Phosphatase: 96 (ref 25–125)
Bilirubin, Total: 0.9

## 2020-09-13 LAB — BASIC METABOLIC PANEL
BUN: 13 (ref 4–21)
CO2: 22 (ref 13–22)
Chloride: 102 (ref 99–108)
Creatinine: 1.1 (ref 0.6–1.3)
Glucose: 330
Potassium: 4.4 (ref 3.4–5.3)
Sodium: 137 (ref 137–147)

## 2020-09-13 LAB — CBC: RBC: 5.12 — AB (ref 3.87–5.11)

## 2020-09-13 NOTE — Progress Notes (Signed)
Provider:   Location:    [facility] Room Number: 32 Place of Service:  SNF (31)  PCP: Burnard Bunting, MD Patient Care Team: Burnard Bunting, MD as PCP - General (Internal Medicine) [name], MD as PCP - Cardiology (Cardiology) [name], MD as Consulting Physician (Cardiology)  Extended Emergency Contact Information Primary Emergency Contact: Yi,[name] Address: [address]          [place], [place] [address] [name] of [place] Phone: [phone] Mobile Phone: [phone] Relation: Spouse  Code Status:  Goals of Care: Advanced Directive information Advanced Directives 07/20/2020  Does Patient Have a Medical Advance Directive? Yes  Type of Advance Directive Living will;Healthcare Power of Attorney  Does patient want to make changes to medical advance directive? No - Patient declined  Copy of [place] in Chart? No - copy requested  Would patient like information on creating a medical advance directive? -      Chief Complaint  Patient presents with  . New Admit To SNF    Admission to SNF    HPI: Patient is a 80 y.o. male seen today for admission to SNF For Change in Mental status  Patient has a history of diabetes type 2, hypertension, HLD, carotid stenosis history of OSA on CPAP Also has a history of unexplained syncope. Patient also has a history of dementia  Most of the history was obtained from the daughter and the wife in the room. Patient has a history of dementia but it seems he had acute change in mental status on Monday this week.  Before that last week patient was walking able to do his ADLs.  Able to feed himself.  Needed mild assist with from his wife.   But since Monday patient has not been responding.  Family is feeding him.  He is  using diapers. Family did not want him to go to the go to ED for further evaluation.  They do not want any IV fluids.  They wanted him to be comfortable.  He is getting  admitted from his apartment to SNF. He has not had any fever chills, no recent falls.  His caregiver has been feeding him and he did eat.  The family has noticed some coughing when he eats. Patient was unable to give me any history.  But he would open his eyes and follow few commands.  But then closes them again.  Seems to be moving on his extremities  Past Medical History:  Diagnosis Date  . Aortic regurgitation    mild by echo 08/2019  . Carotid artery stenosis    1-39% bilateral stenosis by dopplers 08/2019  . Cataract   . Complication of anesthesia    "high blood sugar; he's been loopy last 36h since OR"- 2016  . Diabetes mellitus   . DKA, type 2 ([place])   . Hearing loss   . Hypercholesteremia   . Hypertension   . Insulin pump in place   . Macular degeneration   . MCI (mild cognitive impairment) with memory loss 12/12/2014  . OSA on CPAP 09/11/2014  . PONV (postoperative nausea and vomiting)   . Uses hearing aid    right ear, patient stated he lost left hearing aid    Past Surgical History:  Procedure Laterality Date  . CHOLECYSTECTOMY N/A 10/28/2015   Procedure: LAPAROSCOPIC CHOLECYSTECTOMY WITH   INTRAOPERATIVE CHOLANGIOGRAM;  Surgeon: Greer Pickerel, MD;  Location: [facility];  Service: General;  Laterality: N/A;  .  SHOULDER ARTHROSCOPY Right 08/24/11   "frozen shoulder; RCR w/labrum tear; arthritis scraping; bone spurs"  . SHOULDER ARTHROSCOPY Left ~ 2006  . TONSILLECTOMY  1949   "in childhood"    reports that he quit smoking about 37 years ago. His smoking use included cigarettes. He has a 3.75 pack-year smoking history. He has never used smokeless tobacco. He reports that he does not drink alcohol and does not use drugs. Social History   Socioeconomic History  . Marital status: Married    Spouse name: [name]   . Number of children: 2  . Years of education: masters  . Highest education level: Not on file  Occupational History    Comment: Retired.  Tobacco Use  . Smoking  status: Former Smoker    Packs/day: 0.25    Years: 15.00    Pack years: 3.75    Types: Cigarettes    Quit date: 06/23/1983    Years since quitting: 37.2  . Smokeless tobacco: Never Used  Vaping Use  . Vaping Use: Never used  Substance and Sexual Activity  . Alcohol use: No    Alcohol/week: 0.0 standard drinks  . Drug use: No  . Sexual activity: Yes  Other Topics Concern  . Not on file  Social History Narrative   Patient lives at home with his wife [name]).   Retired.   Education Master's degree.   Right handed.   Caffeine three cups of coffee daily.   Social Determinants of Health   Financial Resource Strain: Not on file  Food Insecurity: Not on file  Transportation Needs: Not on file  Physical Activity: Not on file  Stress: Not on file  Social Connections: Not on file  Intimate Partner Violence: Not on file    Functional Status Survey:    Family History  Problem Relation Age of Onset  . Heart disease Father   . High blood pressure Father   . Diabetes Daughter   . Colon cancer Neg Hx   . Stomach cancer Neg Hx   . Pancreatic cancer Neg Hx   . Esophageal cancer Neg Hx     Health Maintenance  Topic Date Due  . Hepatitis C Screening  Never done  . COVID-19 Vaccine (1) Never done  . FOOT EXAM  Never done  . OPHTHALMOLOGY EXAM  Never done  . HEMOGLOBIN A1C  04/29/2016  . PNA vac Low Risk Adult (2 of 2 - PPSV23) 08/21/2016  . COLONOSCOPY (Pts 45-12yrs Insurance coverage will need to be confirmed)  04/13/2019  . TETANUS/TDAP  03/03/2022  . INFLUENZA VACCINE  Completed  . HPV VACCINES  Aged Out    No Known Allergies  Allergies as of 09/13/2020   No Known Allergies     Medication List       Accurate as of September 13, 2020  2:47 PM. If you have any questions, ask your nurse or doctor.        amLODipine 10 MG tablet Commonly known as: NORVASC Take 10 mg by mouth every evening.   aspirin EC 81 MG tablet Take 81 mg by mouth daily.   donepezil 10 MG  tablet Commonly known as: ARICEPT Take 10 mg by mouth at bedtime.   levothyroxine 25 MCG tablet Commonly known as: SYNTHROID Take 25 mcg by mouth daily before breakfast.   lisinopril 20 MG tablet Commonly known as: ZESTRIL Take 1 tablet (20 mg total) by mouth daily.   multivitamin with minerals Tabs tablet Take 1 tablet by mouth  daily.   NON FORMULARY Take 1 tablet by mouth daily. B-6 vitamin   PRESERVISION AREDS PO Take 1 tablet by mouth daily.   rosuvastatin 20 MG tablet Commonly known as: CRESTOR Take 20 mg by mouth daily.   [name] FlexTouch 100 UNIT/ML FlexTouch Pen Generic drug: insulin degludec Inject 33 Units into the skin in the morning. Per sliding scale   vitamin C 1000 MG tablet Take 1,000 mg by mouth daily.       Review of Systems  Unable to perform ROS: Mental status change    There were no vitals filed for this visit. There is no height or weight on file to calculate BMI. Physical Exam Vitals reviewed.  Constitutional:      Appearance: Normal appearance.     Comments: Will keep his Eye Closed will Respond to his name  HENT:     Head: Normocephalic.     Nose: Nose normal.     Mouth/Throat:     Mouth: Mucous membranes are dry.  Eyes:     Pupils: Pupils are equal, round, and reactive to light.  Cardiovascular:     Rate and Rhythm: Normal rate and regular rhythm.     Pulses: Normal pulses.  Pulmonary:     Effort: Pulmonary effort is normal.     Breath sounds: No rales.  Abdominal:     General: Abdomen is flat. Bowel sounds are normal.     Palpations: Abdomen is soft.  Musculoskeletal:        General: No swelling.     Cervical back: Neck supple.  Skin:    General: Skin is warm and dry.  Neurological:     General: No focal deficit present.     Comments: He is not alert  Moving all extremities. Does respond to his name and Opens his Eye. Follows few commands.  Psychiatric:        Mood and Affect: Mood normal.     Labs  reviewed: Basic Metabolic Panel: Recent Labs    07/20/20 0428 07/21/20 0359 08/02/20 1412  NA 142 143 149*  K 2.9* 3.3* 4.0  CL 104 104 110*  CO2 29 28 23   GLUCOSE 155* 69* 246*  BUN 15 17 18   CREATININE 0.98 1.03 1.14  CALCIUM 8.7* 8.7* 9.2  MG 2.3  --   --    Liver Function Tests: Recent Labs    05/01/20 1637 07/19/20 1450 07/20/20 0428  AST 23 104* 24  ALT 25 37 26  ALKPHOS 76 77 80  BILITOT 0.9 1.5* 0.6  PROT 6.4* 6.3* 6.3*  ALBUMIN 3.2* 3.3* 3.2*   No results for input(s): LIPASE, AMYLASE in the last 8760 hours. No results for input(s): AMMONIA in the last 8760 hours. CBC: Recent Labs    05/01/20 1637 07/19/20 1450 07/19/20 2209  WBC 6.8 10.8* 8.3  NEUTROABS 5.4 8.7*  --   HGB 14.0 14.4 13.5  HCT 41.6 43.1 40.2  MCV 94.5 92.9 92.4  PLT 171 185 172   Cardiac Enzymes: No results for input(s): CKTOTAL, CKMB, CKMBINDEX, TROPONINI in the last 8760 hours. BNP: Invalid input(s): POCBNP Lab Results  Component Value Date   HGBA1C 8.3 (H) 10/28/2015   Lab Results  Component Value Date   TSH 4.697 (H) 07/19/2020   No results found for: VITAMINB12 No results found for: FOLATE No results found for: IRON, TIBC, FERRITIN  Imaging and Procedures obtained prior to SNF admission: DG Chest Portable 1 View  Result Date: 07/19/2020 CLINICAL  DATA:  Near syncope EXAM: PORTABLE CHEST 1 VIEW COMPARISON:  06/17/2016 FINDINGS: Heart size upper normal. Vascularity normal. Lungs clear without infiltrate or effusion. Normal lung volumes. Cardiac loop recorder. IMPRESSION: No active disease. Electronically Signed   By: Franchot Gallo M.D.   On: 07/19/2020 16:02   CUP PACEART REMOTE DEVICE CHECK  Result Date: 07/20/2020 ILR summary report received. Battery status OK. Normal device function. No new symptom, tachy, brady, or pause episodes. No new AF episodes. Monthly summary reports and ROV/PRN   Assessment/Plan Altered mental status, unspecified altered mental status  type Stat CBC,CMP,UA Addendum White Count was 18 K with Left shift Sodium was 137 CBG 400 Will start on Levaquin 500 mg QD for 7 days  Ordered Stat Chest Xray   Discussed with family that this can be acute stroke / infection Family wants him to be comfortable do not want anything aggressive. Do not want him to go to the hospital. Will continue supportive care  Type 2 diabetes mellitus with stage 3a chronic kidney disease, with long-term current use of insulin (HCC) Hold Treshiba as not sure of his PO intake Sliding Scale QAC and HS  Syncope and collapse Has had detail work up with Cardiology Some of his Antihypertensives Labetalol was stopped No Recent Symptoms   Primary hypertension Continue Lisinopril and Amlodipine Hypothyroid Continue Synthroid  Advance care planning MOST FORm signed Hospice referal if no improvement in next few days Speech and therapy evaluating Discontinue Vit c Crestor and Aricept for now  Family/ staff Communication:   Labs/tests ordered:  Total time spent in this patient care encounter was  45_  minutes; greater than 50% of the visit spent counseling patient family  and staff, reviewing records , Labs and coordinating care for problems addressed at this encounter.

## 2020-09-14 ENCOUNTER — Encounter: Payer: Self-pay | Admitting: Internal Medicine

## 2020-09-15 DIAGNOSIS — D72829 Elevated white blood cell count, unspecified: Secondary | ICD-10-CM | POA: Diagnosis not present

## 2020-09-16 ENCOUNTER — Non-Acute Institutional Stay (SKILLED_NURSING_FACILITY): Payer: Medicare Other | Admitting: Nurse Practitioner

## 2020-09-16 ENCOUNTER — Encounter: Payer: Self-pay | Admitting: Nurse Practitioner

## 2020-09-16 DIAGNOSIS — B379 Candidiasis, unspecified: Secondary | ICD-10-CM

## 2020-09-16 DIAGNOSIS — D72825 Bandemia: Secondary | ICD-10-CM | POA: Diagnosis not present

## 2020-09-16 DIAGNOSIS — E1110 Type 2 diabetes mellitus with ketoacidosis without coma: Secondary | ICD-10-CM | POA: Diagnosis not present

## 2020-09-16 DIAGNOSIS — I10 Essential (primary) hypertension: Secondary | ICD-10-CM

## 2020-09-16 DIAGNOSIS — R55 Syncope and collapse: Secondary | ICD-10-CM | POA: Diagnosis not present

## 2020-09-16 DIAGNOSIS — E039 Hypothyroidism, unspecified: Secondary | ICD-10-CM | POA: Diagnosis not present

## 2020-09-16 DIAGNOSIS — F0150 Vascular dementia without behavioral disturbance: Secondary | ICD-10-CM

## 2020-09-16 DIAGNOSIS — L89312 Pressure ulcer of right buttock, stage 2: Secondary | ICD-10-CM | POA: Diagnosis not present

## 2020-09-16 DIAGNOSIS — D72829 Elevated white blood cell count, unspecified: Secondary | ICD-10-CM | POA: Insufficient documentation

## 2020-09-16 DIAGNOSIS — F0390 Unspecified dementia without behavioral disturbance: Secondary | ICD-10-CM | POA: Insufficient documentation

## 2020-09-16 NOTE — Assessment & Plan Note (Signed)
A few small open areas R buttock, will continue barrier ointment, will apply airflow mattress overlay, encourage and assist the patient with frequent repositioning.

## 2020-09-16 NOTE — Assessment & Plan Note (Signed)
Off Donepezil, SNF [facility] for supportive care, may consider Hospice service.

## 2020-09-16 NOTE — Assessment & Plan Note (Signed)
generalized weakness and incontinent of urine about a week. Levaquin 500mg  qd x 7 days started, pending CXR, UA C/S 09/13/20 due to elevated wbc 18.2, neutrophils 83.8%. no significant improvement 09/16/20, the patient is afebrile, VS stable.

## 2020-09-16 NOTE — Assessment & Plan Note (Signed)
takes Levothyroxine, TSH 4.697 07/20/20

## 2020-09-16 NOTE — Assessment & Plan Note (Signed)
No recurrence presently.

## 2020-09-16 NOTE — Assessment & Plan Note (Signed)
R+L buttocks, pressure and moist are contributory, will apply Nystatin powder bid x 10days. Assist the patient with repositioning and incontinent care.

## 2020-09-16 NOTE — Assessment & Plan Note (Signed)
Blood pressure is controlled, continue Amlodipine, Lisinopril.

## 2020-09-16 NOTE — Progress Notes (Signed)
Location:    [facility] Room Number: 27 Place of Service:  SNF (31) Provider: Marlana Latus NP  Burnard Bunting, MD  Patient Care Team: Burnard Bunting, MD as PCP - General (Internal Medicine) [name], MD as PCP - Cardiology (Cardiology) [name], MD as Consulting Physician (Cardiology)  Extended Emergency Contact Information Primary Emergency Contact: Hipwell,[name] Address: [address]          [place], [place] [address] [name] of [place] Phone: [phone] Mobile Phone: [phone] Relation: Spouse  Code Status: DNR Goals of care: Advanced Directive information Advanced Directives 07/20/2020  Does Patient Have a Medical Advance Directive? Yes  Type of Advance Directive Living will;Healthcare Power of Attorney  Does patient want to make changes to medical advance directive? No - Patient declined  Copy of [place] in Chart? No - copy requested  Would patient like information on creating a medical advance directive? -     Chief Complaint  Patient presents with  . Acute Visit    Elevated WBC    HPI:  Pt is a 80 y.o. male seen today for an acute visit for generalized weakness and incontinent of urine about a week. Levaquin 500mg  qd x 7 days started, pending CXR, UA C/S 09/13/20 due to elevated wbc 18.2, neutrophils 83.8%. no significant improvement 09/16/20, the patient is afebrile, VS stable.   The patient's wife reported the patient has redness in buttocks, pressure ulcers R buttock.  T2DM Tresiba, SSI  Syncope, stopped Labetalol, no recurrence reported  HTN, takes Amlodipine, Lisinopril Bun/creat 13/1.1 09/13/20  Hypothyroidism, takes Levothyroxine, TSH 4.697 07/20/20  Dementia, off Donepezil.    Past Medical History:  Diagnosis Date  . Aortic regurgitation    mild by echo 08/2019  . Carotid artery stenosis    1-39% bilateral stenosis by dopplers 08/2019  . Cataract   . Complication of  anesthesia    "high blood sugar; he's been loopy last 36h since OR"- 2016  . Diabetes mellitus   . DKA, type 2 ([place])   . Hearing loss   . Hypercholesteremia   . Hypertension   . Insulin pump in place   . Macular degeneration   . MCI (mild cognitive impairment) with memory loss 12/12/2014  . OSA on CPAP 09/11/2014  . PONV (postoperative nausea and vomiting)   . Uses hearing aid    right ear, patient stated he lost left hearing aid    Past Surgical History:  Procedure Laterality Date  . CHOLECYSTECTOMY N/A 10/28/2015   Procedure: LAPAROSCOPIC CHOLECYSTECTOMY WITH   INTRAOPERATIVE CHOLANGIOGRAM;  Surgeon: Greer Pickerel, MD;  Location: [facility];  Service: General;  Laterality: N/A;  . SHOULDER ARTHROSCOPY Right 08/24/11   "frozen shoulder; RCR w/labrum tear; arthritis scraping; bone spurs"  . SHOULDER ARTHROSCOPY Left ~ 2006  . TONSILLECTOMY  1949   "in childhood"    No Known Allergies  Allergies as of 09/16/2020   No Known Allergies     Medication List       Accurate as of September 16, 2020  4:54 PM. If you have any questions, ask your nurse or doctor.        amLODipine 10 MG tablet Commonly known as: NORVASC Take 10 mg by mouth every evening.   aspirin EC 81 MG tablet Take 81 mg by mouth daily.   levofloxacin 500 MG tablet Commonly known as: LEVAQUIN Take 500 mg by mouth daily.   levothyroxine 25 MCG  tablet Commonly known as: SYNTHROID Take 25 mcg by mouth daily before breakfast.   lisinopril 20 MG tablet Commonly known as: ZESTRIL Take 1 tablet (20 mg total) by mouth daily.   multivitamin with minerals Tabs tablet Take 1 tablet by mouth daily.   NON FORMULARY Take 1 tablet by mouth daily. B-6 vitamin   NovoLOG 100 UNIT/ML injection Generic drug: insulin aspart Inject into the skin 3 (three) times daily before meals. See sliding scale for CBG greater than 151.   PRESERVISION AREDS PO Take 1 tablet by mouth daily.   saccharomyces boulardii 250 MG  capsule Commonly known as: FLORASTOR Take 250 mg by mouth at bedtime.   [name] FlexTouch 100 UNIT/ML FlexTouch Pen Generic drug: insulin degludec Inject 15 Units into the skin in the morning. Per sliding scale      ROS was provided with assistance of the patient's wife.  Review of Systems  Constitutional: Positive for fatigue. Negative for appetite change and fever.  HENT: Positive for hearing loss. Negative for congestion and voice change.   Eyes: Negative for visual disturbance.  Respiratory: Negative for cough, shortness of breath and wheezing.   Cardiovascular: Negative for leg swelling.  Gastrointestinal: Negative for abdominal pain, constipation, nausea and vomiting.  Genitourinary: Negative for dysuria, hematuria and urgency.       Incontinent of urine about 1 week  Musculoskeletal: Positive for gait problem.  Skin: Positive for rash.       R+L buttocks redness, pressure areas R buttock  Neurological: Negative for syncope, weakness and headaches.       Dementia  Psychiatric/Behavioral: Positive for confusion. Negative for sleep disturbance. The patient is not nervous/anxious.     Immunization History  Administered Date(s) Administered  . DTaP, 5 pertussis antigens 09/07/2016  . Influenza Split 05/09/2010, 03/02/2011, 03/21/2012, 04/06/2013, 03/26/2014  . Influenza, High Dose Seasonal PF 04/06/2013, 04/02/2016, 03/31/2017  . Influenza, Quadrivalent, Recombinant, Inj, Pf 03/14/2018, 03/06/2019, 05/15/2020  . Influenza,inj,Quad PF,6+ Mos 05/01/2015  . Pneumococcal Conjugate-13 01/31/2014, 08/22/2015  . Pneumococcal Polysaccharide-23 05/21/2008, 05/23/2009  . Td 05/18/2007, 05/23/2009  . Tdap 03/03/2012  . Zoster 05/21/2008, 05/23/2009   Pertinent  Health Maintenance Due  Topic Date Due  . FOOT EXAM  Never done  . OPHTHALMOLOGY EXAM  Never done  . HEMOGLOBIN A1C  04/29/2016  . COLONOSCOPY (Pts 45-59yrs Insurance coverage will need to be confirmed)  04/13/2019  .  INFLUENZA VACCINE  Completed  . PNA vac Low Risk Adult  Completed   No flowsheet data found. Functional Status Survey:    Vitals:   09/16/20 1638  BP: 101/62  Pulse: 78  Resp: 16  Temp: 97.7 F (36.5 C)  SpO2: 95%  Weight: 177 lb 4.8 oz (80.4 kg)  Height: 5\' 5"  (1.651 m)   Body mass index is 29.5 kg/m. Physical Exam Vitals and nursing note reviewed.  Constitutional:      General: He is not in acute distress.    Appearance: Normal appearance. He is not ill-appearing.  HENT:     Head: Normocephalic and atraumatic.     Nose: Nose normal.     Mouth/Throat:     Mouth: Mucous membranes are moist.  Eyes:     Extraocular Movements: Extraocular movements intact.     Conjunctiva/sclera: Conjunctivae normal.     Pupils: Pupils are equal, round, and reactive to light.  Cardiovascular:     Rate and Rhythm: Normal rate and regular rhythm.     Heart sounds: No murmur heard.  Pulmonary:     Effort: Pulmonary effort is normal.     Breath sounds: No wheezing, rhonchi or rales.  Abdominal:     General: Bowel sounds are normal.     Palpations: Abdomen is soft.     Tenderness: There is no abdominal tenderness. There is no right CVA tenderness, left CVA tenderness, guarding or rebound.  Musculoskeletal:     Cervical back: Normal range of motion and neck supple.     Right lower leg: No edema.     Left lower leg: No edema.  Skin:    General: Skin is warm and dry.     Findings: Rash present.     Comments: R+L buttocks redness, pressure areas R buttock  Neurological:     General: No focal deficit present.     Mental Status: He is alert.     Motor: No weakness.     Coordination: Coordination normal.     Gait: Gait normal.     Comments: Oriented to self.   Psychiatric:     Comments: Alert, followed simple directions     Labs reviewed: Recent Labs    07/20/20 0428 07/21/20 0359 08/02/20 1412 09/13/20 0000  NA 142 143 149* 137  K 2.9* 3.3* 4.0 4.4  CL 104 104 110* 102   CO2 29 28 23 22   GLUCOSE 155* 69* 246*  --   BUN 15 17 18 13   CREATININE 0.98 1.03 1.14 1.1  CALCIUM 8.7* 8.7* 9.2 8.6*  MG 2.3  --   --   --    Recent Labs    05/01/20 1637 07/19/20 1450 07/20/20 0428 09/13/20 0000  AST 23 104* 24 19  ALT 25 37 26 15  ALKPHOS 76 77 80 96  BILITOT 0.9 1.5* 0.6  --   PROT 6.4* 6.3* 6.3*  --   ALBUMIN 3.2* 3.3* 3.2* 3.4*   Recent Labs    05/01/20 1637 07/19/20 1450 07/19/20 2209 09/13/20 0000  WBC 6.8 10.8* 8.3 18.2  NEUTROABS 5.4 8.7*  --  15,252.00  HGB 14.0 14.4 13.5 15.9  HCT 41.6 43.1 40.2 47  MCV 94.5 92.9 92.4  --   PLT 171 185 172 252   Lab Results  Component Value Date   TSH 4.697 (H) 07/19/2020   Lab Results  Component Value Date   HGBA1C 8.3 (H) 10/28/2015   No results found for: CHOL, HDL, LDLCALC, LDLDIRECT, TRIG, CHOLHDL  Significant Diagnostic Results in last 30 days:  CUP PACEART REMOTE DEVICE CHECK  Result Date: 08/28/2020 ILR summary report received. Battery status OK. Normal device function. 2 consecutive Pause episodes. 3 and 6 sec each in duration with mean V rates 50's-60's. No new symptom, tachy or brady episodes. No new AF episodes. Routing to triage for 2 true consecutive pause episodes.  [place] Patient was seen in ED for pauses. LW   Assessment/Plan: Pressure ulcer of right buttock, stage 2 (HCC) A few small open areas R buttock, will continue barrier ointment, will apply airflow mattress overlay, encourage and assist the patient with frequent repositioning.   Candidiasis R+L buttocks, pressure and moist are contributory, will apply Nystatin powder bid x 10days. Assist the patient with repositioning and incontinent care.   Leukocytosis generalized weakness and incontinent of urine about a week. Levaquin 500mg  qd x 7 days started, pending CXR, UA C/S 09/13/20 due to elevated wbc 18.2, neutrophils 83.8%. no significant improvement 09/16/20, the patient is afebrile, VS stable.    Diabetic ketoacidosis  without  coma associated with type 2 diabetes mellitus ([place]) Continue Tresiba, SSI  HTN (hypertension) Blood pressure is controlled, continue Amlodipine, Lisinopril.   Syncope and collapse No recurrence presently.   Hypothyroidism takes Levothyroxine, TSH 4.697 07/20/20   Vascular dementia ([place]) Off Donepezil, SNF [facility] for supportive care, may consider Hospice service.     Family/ staff Communication: plan of care reviewed with the patient and charge nurse.   Labs/tests ordered:  UA C/S   Time spend 35 minutes.

## 2020-09-16 NOTE — Assessment & Plan Note (Signed)
Continue Derrick [name], SSI

## 2020-09-17 ENCOUNTER — Encounter: Payer: Self-pay | Admitting: Nurse Practitioner

## 2020-09-17 DIAGNOSIS — N390 Urinary tract infection, site not specified: Secondary | ICD-10-CM | POA: Diagnosis not present

## 2020-09-19 ENCOUNTER — Inpatient Hospital Stay (HOSPITAL_COMMUNITY): Admission: RE | Admit: 2020-09-19 | Payer: Medicare PPO | Source: Ambulatory Visit

## 2020-09-24 ENCOUNTER — Ambulatory Visit (INDEPENDENT_AMBULATORY_CARE_PROVIDER_SITE_OTHER): Payer: Medicare PPO

## 2020-09-24 DIAGNOSIS — R55 Syncope and collapse: Secondary | ICD-10-CM

## 2020-09-24 LAB — CUP PACEART REMOTE DEVICE CHECK
Date Time Interrogation Session: 20220404115537
Implantable Pulse Generator Implant Date: 20210714

## 2020-10-04 NOTE — Progress Notes (Signed)
Carelink Summary Report / Loop Recorder 

## 2020-10-07 ENCOUNTER — Encounter: Payer: Self-pay | Admitting: Nurse Practitioner

## 2020-10-07 ENCOUNTER — Non-Acute Institutional Stay (SKILLED_NURSING_FACILITY): Payer: Medicare Other | Admitting: Nurse Practitioner

## 2020-10-07 DIAGNOSIS — F0150 Vascular dementia without behavioral disturbance: Secondary | ICD-10-CM

## 2020-10-07 DIAGNOSIS — N1831 Chronic kidney disease, stage 3a: Secondary | ICD-10-CM | POA: Diagnosis not present

## 2020-10-07 DIAGNOSIS — L89312 Pressure ulcer of right buttock, stage 2: Secondary | ICD-10-CM

## 2020-10-07 DIAGNOSIS — G4733 Obstructive sleep apnea (adult) (pediatric): Secondary | ICD-10-CM | POA: Diagnosis not present

## 2020-10-07 DIAGNOSIS — E039 Hypothyroidism, unspecified: Secondary | ICD-10-CM

## 2020-10-07 DIAGNOSIS — R55 Syncope and collapse: Secondary | ICD-10-CM

## 2020-10-07 DIAGNOSIS — I10 Essential (primary) hypertension: Secondary | ICD-10-CM | POA: Diagnosis not present

## 2020-10-07 DIAGNOSIS — Z794 Long term (current) use of insulin: Secondary | ICD-10-CM | POA: Diagnosis not present

## 2020-10-07 DIAGNOSIS — E1122 Type 2 diabetes mellitus with diabetic chronic kidney disease: Secondary | ICD-10-CM | POA: Diagnosis not present

## 2020-10-07 DIAGNOSIS — R296 Repeated falls: Secondary | ICD-10-CM | POA: Diagnosis not present

## 2020-10-07 NOTE — Assessment & Plan Note (Signed)
stopped Labetalol, no recurrence reported 

## 2020-10-07 NOTE — Assessment & Plan Note (Signed)
off Donepezil, Hospice service

## 2020-10-07 NOTE — Assessment & Plan Note (Signed)
low am CBG 50-62 4/7-4/14, CBG 166150 73 after HS SSI discontinued. Continue [name], SSI

## 2020-10-07 NOTE — Assessment & Plan Note (Signed)
takes Levothyroxine, TSH 4.697 07/20/20

## 2020-10-07 NOTE — Progress Notes (Signed)
Location:   SNF [place] Room Number: 5 Place of Service:  SNF (31) Provider: Lennie Odor Courtlynn Holloman NP  Burnard Bunting, MD  Patient Care Team: Burnard Bunting, MD as PCP - General (Internal Medicine) [name], MD as PCP - Cardiology (Cardiology) [name], MD as Consulting Physician (Cardiology)  Extended Emergency Contact Information Primary Emergency Contact: [place],[name] Address: [address]          [place], [place] [address] [name] of [place] Phone: [phone] Mobile Phone: [phone] Relation: Spouse  Code Status: DNR Goals of care: Advanced Directive information Advanced Directives 07/20/2020  Does Patient Have a Medical Advance Directive? Yes  Type of Advance Directive Living will;Healthcare Power of Attorney  Does patient want to make changes to medical advance directive? No - Patient declined  Copy of [place] in Chart? No - copy requested  Would patient like information on creating a medical advance directive? -     Chief Complaint  Patient presents with  . Acute Visit    Low blood sugar in am    HPI:  Pt is a 80 y.o. male seen today for an acute visit for low am CBG 50-62 4/7-4/14, CBG 130865 73 after HS SSI discontinued.   Gait difficulty, the patient needs assistance for transfer, frequent falling due to attempting transferring self w/o calling for assistance. The patient was found on the floor in front of his w/c upon my examination.   pressure ulcers R buttock, healing.              T2DM Tresiba, SSI             Syncope, stopped Labetalol, no recurrence reported             HTN, takes Amlodipine, Lisinopril Bun/creat 13/1.1 09/13/20             Hypothyroidism, takes Levothyroxine, TSH 4.697 07/20/20             Dementia, off Donepezil, Hospice service   Past Medical History:  Diagnosis Date  . Aortic regurgitation    mild by echo 08/2019  . Carotid artery stenosis    1-39% bilateral stenosis by  dopplers 08/2019  . Cataract   . Complication of anesthesia    "high blood sugar; he's been loopy last 36h since OR"- 2016  . Diabetes mellitus   . DKA, type 2 ([place])   . Hearing loss   . Hypercholesteremia   . Hypertension   . Insulin pump in place   . Macular degeneration   . MCI (mild cognitive impairment) with memory loss 12/12/2014  . OSA on CPAP 09/11/2014  . PONV (postoperative nausea and vomiting)   . Uses hearing aid    right ear, patient stated he lost left hearing aid    Past Surgical History:  Procedure Laterality Date  . CHOLECYSTECTOMY N/A 10/28/2015   Procedure: LAPAROSCOPIC CHOLECYSTECTOMY WITH   INTRAOPERATIVE CHOLANGIOGRAM;  Surgeon: Greer Pickerel, MD;  Location: [facility];  Service: General;  Laterality: N/A;  . SHOULDER ARTHROSCOPY Right 08/24/11   "frozen shoulder; RCR w/labrum tear; arthritis scraping; bone spurs"  . SHOULDER ARTHROSCOPY Left ~ 2006  . TONSILLECTOMY  1949   "in childhood"    No Known Allergies  Allergies as of 10/07/2020   No Known Allergies     Medication List       Accurate as of October 07, 2020 11:59 PM. If you have any questions, ask your nurse  or doctor.        amLODipine 10 MG tablet Commonly known as: NORVASC Take 10 mg by mouth every evening.   aspirin EC 81 MG tablet Take 81 mg by mouth daily.   levothyroxine 25 MCG tablet Commonly known as: SYNTHROID Take 25 mcg by mouth daily before breakfast.   lisinopril 20 MG tablet Commonly known as: ZESTRIL Take 1 tablet (20 mg total) by mouth daily.   multivitamin with minerals Tabs tablet Take 1 tablet by mouth daily.   NON FORMULARY Take 1 tablet by mouth daily. B-6 vitamin   NovoLOG 100 UNIT/ML injection Generic drug: insulin aspart Inject into the skin 3 (three) times daily before meals. See sliding scale for CBG greater than 151.   PRESERVISION AREDS PO Take 1 tablet by mouth daily.   [name] FlexTouch 100 UNIT/ML FlexTouch Pen Generic drug: insulin degludec Inject 15  Units into the skin in the morning. Per sliding scale       Review of Systems  Constitutional: Negative for appetite change, fatigue and fever.  HENT: Positive for hearing loss. Negative for congestion and voice change.   Eyes: Negative for visual disturbance.  Respiratory: Negative for cough and shortness of breath.   Cardiovascular: Negative for leg swelling.  Gastrointestinal: Negative for abdominal pain and constipation.  Genitourinary: Negative for dysuria, hematuria and urgency.       Incontinent of urine about 1 week  Musculoskeletal: Positive for gait problem.  Skin: Negative for rash.       R+L buttocks redness, pressure areas R buttock, healing.   Neurological: Negative for syncope, weakness and headaches.       Dementia  Psychiatric/Behavioral: Positive for confusion. Negative for sleep disturbance. The patient is not nervous/anxious.     Immunization History  Administered Date(s) Administered  . DTaP, 5 pertussis antigens 09/07/2016  . Influenza Split 05/09/2010, 03/02/2011, 03/21/2012, 04/06/2013, 03/26/2014  . Influenza, High Dose Seasonal PF 04/06/2013, 04/02/2016, 03/31/2017  . Influenza, Quadrivalent, Recombinant, Inj, Pf 03/14/2018, 03/06/2019, 05/15/2020  . Influenza,inj,Quad PF,6+ Mos 05/01/2015  . Pneumococcal Conjugate-13 01/31/2014, 08/22/2015  . Pneumococcal Polysaccharide-23 05/21/2008, 05/23/2009  . Td 05/18/2007, 05/23/2009  . Tdap 03/03/2012  . Zoster 05/21/2008, 05/23/2009   Pertinent  Health Maintenance Due  Topic Date Due  . FOOT EXAM  Never done  . OPHTHALMOLOGY EXAM  Never done  . HEMOGLOBIN A1C  04/29/2016  . COLONOSCOPY (Pts 45-75yrs Insurance coverage will need to be confirmed)  04/13/2019  . INFLUENZA VACCINE  01/20/2021  . PNA vac Low Risk Adult  Completed   No flowsheet data found. Functional Status Survey:    Vitals:   10/07/20 1530  BP: 134/80  Pulse: 78  Resp: 18  Temp: 98 F (36.7 C)  SpO2: 95%   There is no height or  weight on file to calculate BMI. Physical Exam Vitals and nursing note reviewed.  Constitutional:      Appearance: Normal appearance.  HENT:     Head: Normocephalic and atraumatic.     Nose: Nose normal.     Mouth/Throat:     Mouth: Mucous membranes are moist.  Eyes:     Extraocular Movements: Extraocular movements intact.     Conjunctiva/sclera: Conjunctivae normal.     Pupils: Pupils are equal, round, and reactive to light.  Cardiovascular:     Rate and Rhythm: Normal rate and regular rhythm.     Heart sounds: No murmur heard.   Pulmonary:     Effort: Pulmonary effort is normal.  Breath sounds: No rales.  Abdominal:     General: Bowel sounds are normal.     Palpations: Abdomen is soft.     Tenderness: There is no abdominal tenderness.  Musculoskeletal:     Cervical back: Normal range of motion and neck supple.     Right lower leg: No edema.     Left lower leg: No edema.  Skin:    General: Skin is warm and dry.     Comments: R+L buttocks redness, pressure areas R buttock, healing  Neurological:     General: No focal deficit present.     Mental Status: He is alert.     Motor: No weakness.     Coordination: Coordination normal.     Gait: Gait abnormal.     Comments: Oriented to self.   Psychiatric:     Comments: Alert, followed simple directions     Labs reviewed: Recent Labs    07/20/20 0428 07/21/20 0359 08/02/20 1412 09/13/20 0000  NA 142 143 149* 137  K 2.9* 3.3* 4.0 4.4  CL 104 104 110* 102  CO2 29 28 23 22   GLUCOSE 155* 69* 246*  --   BUN 15 17 18 13   CREATININE 0.98 1.03 1.14 1.1  CALCIUM 8.7* 8.7* 9.2 8.6*  MG 2.3  --   --   --    Recent Labs    05/01/20 1637 07/19/20 1450 07/20/20 0428 09/13/20 0000  AST 23 104* 24 19  ALT 25 37 26 15  ALKPHOS 76 77 80 96  BILITOT 0.9 1.5* 0.6  --   PROT 6.4* 6.3* 6.3*  --   ALBUMIN 3.2* 3.3* 3.2* 3.4*   Recent Labs    05/01/20 1637 07/19/20 1450 07/19/20 2209 09/13/20 0000  WBC 6.8 10.8* 8.3  18.2  NEUTROABS 5.4 8.7*  --  15,252.00  HGB 14.0 14.4 13.5 15.9  HCT 41.6 43.1 40.2 47  MCV 94.5 92.9 92.4  --   PLT 171 185 172 252   Lab Results  Component Value Date   TSH 4.697 (H) 07/19/2020   Lab Results  Component Value Date   HGBA1C 8.3 (H) 10/28/2015   No results found for: CHOL, HDL, LDLCALC, LDLDIRECT, TRIG, CHOLHDL  Significant Diagnostic Results in last 30 days:  CUP PACEART REMOTE DEVICE CHECK  Result Date: 09/24/2020 ILR summary report received. Battery status OK. Normal device function. No new symptom, tachy, brady, or pause episodes ( 1 pause stored addressed as alert- nocturnal). No new AF episodes. Monthly summary reports and ROV/PRN   Assessment/Plan: Diabetes mellitus ([place])  low am CBG 50-62 4/7-4/14, CBG 017510 73 after HS SSI discontinued. Continue Tresiba, SSI   Pressure ulcer of right buttock, stage 2 (HCC) Healing.   Syncope and collapse stopped Labetalol, no recurrence reported  HTN (hypertension) Blood pressure is controlled, takes Amlodipine, Lisinopril Bun/creat 13/1.1 09/13/20   Hypothyroidism  takes Levothyroxine, TSH 4.697 07/20/20   Vascular dementia ([place]) off Donepezil, Hospice service   Frequent falls Gait difficulty, the patient needs assistance for transfer, frequent falling due to attempting transferring self w/o calling for assistance. The patient was found on the floor in front of his w/c upon my examination.     Family/ staff Communication: plan of care reviewed with the patient and charge nurse.   Labs/tests ordered:  None  Time spend 25 minutes.

## 2020-10-07 NOTE — Assessment & Plan Note (Signed)
Blood pressure is controlled, takes Amlodipine, Lisinopril Bun/creat 13/1.1 09/13/20

## 2020-10-07 NOTE — Assessment & Plan Note (Signed)
Healing

## 2020-10-07 NOTE — Progress Notes (Signed)
This encounter was created in error - please disregard.

## 2020-10-07 NOTE — Progress Notes (Deleted)
This encounter was created in error - please disregard.

## 2020-10-08 ENCOUNTER — Encounter: Payer: Self-pay | Admitting: Nurse Practitioner

## 2020-10-08 DIAGNOSIS — R296 Repeated falls: Secondary | ICD-10-CM | POA: Insufficient documentation

## 2020-10-08 NOTE — Assessment & Plan Note (Signed)
Gait difficulty, the patient needs assistance for transfer, frequent falling due to attempting transferring self w/o calling for assistance. The patient was found on the floor in front of his w/c upon my examination.

## 2020-10-10 ENCOUNTER — Ambulatory Visit: Payer: TRICARE For Life (TFL) | Admitting: Adult Health

## 2020-10-18 ENCOUNTER — Non-Acute Institutional Stay (SKILLED_NURSING_FACILITY): Payer: Medicare Other | Admitting: Nurse Practitioner

## 2020-10-18 ENCOUNTER — Encounter: Payer: Self-pay | Admitting: Nurse Practitioner

## 2020-10-18 DIAGNOSIS — Z794 Long term (current) use of insulin: Secondary | ICD-10-CM

## 2020-10-18 DIAGNOSIS — I10 Essential (primary) hypertension: Secondary | ICD-10-CM

## 2020-10-18 DIAGNOSIS — F0150 Vascular dementia without behavioral disturbance: Secondary | ICD-10-CM

## 2020-10-18 DIAGNOSIS — E039 Hypothyroidism, unspecified: Secondary | ICD-10-CM | POA: Diagnosis not present

## 2020-10-18 DIAGNOSIS — R55 Syncope and collapse: Secondary | ICD-10-CM

## 2020-10-18 DIAGNOSIS — E1122 Type 2 diabetes mellitus with diabetic chronic kidney disease: Secondary | ICD-10-CM | POA: Diagnosis not present

## 2020-10-18 DIAGNOSIS — N1831 Chronic kidney disease, stage 3a: Secondary | ICD-10-CM

## 2020-10-18 DIAGNOSIS — R296 Repeated falls: Secondary | ICD-10-CM

## 2020-10-18 NOTE — Assessment & Plan Note (Signed)
Gait difficulty, the patient needs assistance for transfer, frequent falling due to attempting transferring self w/o calling for assistance. 

## 2020-10-18 NOTE — Assessment & Plan Note (Signed)
takes Levothyroxine, TSH 4.697 07/20/20  

## 2020-10-18 NOTE — Assessment & Plan Note (Signed)
Blood pressure is controlled, takes Amlodipine, Lisinopril Bun/creat 13/1.1 09/13/20  

## 2020-10-18 NOTE — Assessment & Plan Note (Signed)
Syncope, stopped Labetalol, no recurrence reported 

## 2020-10-18 NOTE — Progress Notes (Signed)
Location:   SNF [facility] Room Number: 80 Place of Service:  SNF (31) Provider: Lennie Odor Johnye Kist NP  Burnard Bunting, MD  Patient Care Team: Burnard Bunting, MD as PCP - General (Internal Medicine) [name], MD as PCP - Cardiology (Cardiology) [name], MD as Consulting Physician (Cardiology)  Extended Emergency Contact Information Primary Emergency Contact: [place],[name] Address: [address]          [place], [place] [address] [name] of [place] Phone: [phone] Mobile Phone: [phone] Relation: Spouse  Code Status:  DNR Goals of care: Advanced Directive information Advanced Directives 07/20/2020  Does Patient Have a Medical Advance Directive? Yes  Type of Advance Directive Living will;Healthcare Power of Attorney  Does patient want to make changes to medical advance directive? No - Patient declined  Copy of [place] in Chart? No - copy requested  Would patient like information on creating a medical advance directive? -     Chief Complaint  Patient presents with  . Medical Management of Chronic Issues    HPI:  Pt is a 80 y.o. male seen today for medical management of chronic diseases.    Gait difficulty, the patient needs assistance for transfer, frequent falling due to attempting transferring self w/o calling for assistance.  T2DM Tresiba, SSI Syncope, stopped Labetalol, no recurrence reported HTN, takes Amlodipine, Lisinopril Bun/creat 13/1.1 09/13/20 Hypothyroidism, takes Levothyroxine, TSH 4.697 07/20/20 Dementia, off Donepezil, Hospice service    Past Medical History:  Diagnosis Date  . Aortic regurgitation    mild by echo 08/2019  . Carotid artery stenosis    1-39% bilateral stenosis by dopplers 08/2019  . Cataract   . Complication of anesthesia    "high blood sugar; he's been loopy last 36h since OR"- 2016  . Diabetes mellitus   . DKA,  type 2 ([place])   . Hearing loss   . Hypercholesteremia   . Hypertension   . Insulin pump in place   . Macular degeneration   . MCI (mild cognitive impairment) with memory loss 12/12/2014  . OSA on CPAP 09/11/2014  . PONV (postoperative nausea and vomiting)   . Uses hearing aid    right ear, patient stated he lost left hearing aid    Past Surgical History:  Procedure Laterality Date  . CHOLECYSTECTOMY N/A 10/28/2015   Procedure: LAPAROSCOPIC CHOLECYSTECTOMY WITH   INTRAOPERATIVE CHOLANGIOGRAM;  Surgeon: Greer Pickerel, MD;  Location: [facility];  Service: General;  Laterality: N/A;  . SHOULDER ARTHROSCOPY Right 08/24/11   "frozen shoulder; RCR w/labrum tear; arthritis scraping; bone spurs"  . SHOULDER ARTHROSCOPY Left ~ 2006  . TONSILLECTOMY  1949   "in childhood"    No Known Allergies  Allergies as of 10/18/2020   No Known Allergies     Medication List       Accurate as of October 18, 2020  4:09 PM. If you have any questions, ask your nurse or doctor.        amLODipine 10 MG tablet Commonly known as: NORVASC Take 10 mg by mouth every evening.   aspirin EC 81 MG tablet Take 81 mg by mouth daily.   levothyroxine 25 MCG tablet Commonly known as: SYNTHROID Take 25 mcg by mouth daily before breakfast.   lisinopril 20 MG tablet Commonly known as: ZESTRIL Take 1 tablet (20 mg total) by mouth daily.   multivitamin with minerals Tabs tablet Take 1 tablet by mouth daily.   NON FORMULARY Take 1 tablet  by mouth daily. B-6 vitamin   NovoLOG 100 UNIT/ML injection Generic drug: insulin aspart Inject into the skin 3 (three) times daily before meals. See sliding scale for CBG greater than 151.   PRESERVISION AREDS PO Take 1 tablet by mouth daily.   [name] FlexTouch 100 UNIT/ML FlexTouch Pen Generic drug: insulin degludec Inject 15 Units into the skin in the morning. Per sliding scale       Review of Systems  Constitutional: Negative for appetite change, fatigue and fever.   HENT: Positive for hearing loss. Negative for congestion and voice change.   Eyes: Negative for visual disturbance.  Respiratory: Negative for cough.   Cardiovascular: Negative for leg swelling.  Gastrointestinal: Negative for abdominal pain and constipation.  Genitourinary: Negative for dysuria, hematuria and urgency.       Incontinent of urine about 1 week  Musculoskeletal: Positive for gait problem.  Skin: Negative for color change.  Neurological: Negative for syncope, weakness and light-headedness.       Dementia  Psychiatric/Behavioral: Positive for confusion. Negative for sleep disturbance. The patient is not nervous/anxious.     Immunization History  Administered Date(s) Administered  . DTaP, 5 pertussis antigens 09/07/2016  . Influenza Split 05/09/2010, 03/02/2011, 03/21/2012, 04/06/2013, 03/26/2014  . Influenza, High Dose Seasonal PF 04/06/2013, 04/02/2016, 03/31/2017  . Influenza, Quadrivalent, Recombinant, Inj, Pf 03/14/2018, 03/06/2019, 05/15/2020  . Influenza,inj,Quad PF,6+ Mos 05/01/2015  . Pneumococcal Conjugate-13 01/31/2014, 08/22/2015  . Pneumococcal Polysaccharide-23 05/21/2008, 05/23/2009  . Td 05/18/2007, 05/23/2009  . Tdap 03/03/2012  . Zoster 05/21/2008, 05/23/2009   Pertinent  Health Maintenance Due  Topic Date Due  . FOOT EXAM  Never done  . OPHTHALMOLOGY EXAM  Never done  . HEMOGLOBIN A1C  04/29/2016  . COLONOSCOPY (Pts 45-45yrs Insurance coverage will need to be confirmed)  04/13/2019  . INFLUENZA VACCINE  01/20/2021  . PNA vac Low Risk Adult  Completed   No flowsheet data found. Functional Status Survey:    Vitals:   10/18/20 1602  BP: 140/82  Pulse: 72  Resp: 18  Temp: 98.1 F (36.7 C)  SpO2: 94%   There is no height or weight on file to calculate BMI. Physical Exam Vitals and nursing note reviewed.  Constitutional:      Appearance: Normal appearance.  HENT:     Head: Normocephalic and atraumatic.     Nose: Nose normal.      Mouth/Throat:     Mouth: Mucous membranes are moist.  Eyes:     Extraocular Movements: Extraocular movements intact.     Conjunctiva/sclera: Conjunctivae normal.     Pupils: Pupils are equal, round, and reactive to light.  Cardiovascular:     Rate and Rhythm: Normal rate and regular rhythm.     Heart sounds: No murmur heard.   Pulmonary:     Effort: Pulmonary effort is normal.     Breath sounds: No rales.  Abdominal:     General: Bowel sounds are normal.     Palpations: Abdomen is soft.     Tenderness: There is no abdominal tenderness.  Musculoskeletal:     Cervical back: Normal range of motion and neck supple.     Right lower leg: No edema.     Left lower leg: No edema.  Skin:    General: Skin is warm and dry.  Neurological:     General: No focal deficit present.     Mental Status: He is alert.     Motor: No weakness.  Coordination: Coordination normal.     Gait: Gait abnormal.     Comments: Oriented to self.   Psychiatric:     Comments: Alert, followed simple directions     Labs reviewed: Recent Labs    07/20/20 0428 07/21/20 0359 08/02/20 1412 09/13/20 0000  NA 142 143 149* 137  K 2.9* 3.3* 4.0 4.4  CL 104 104 110* 102  CO2 29 28 23 22   GLUCOSE 155* 69* 246*  --   BUN 15 17 18 13   CREATININE 0.98 1.03 1.14 1.1  CALCIUM 8.7* 8.7* 9.2 8.6*  MG 2.3  --   --   --    Recent Labs    05/01/20 1637 07/19/20 1450 07/20/20 0428 09/13/20 0000  AST 23 104* 24 19  ALT 25 37 26 15  ALKPHOS 76 77 80 96  BILITOT 0.9 1.5* 0.6  --   PROT 6.4* 6.3* 6.3*  --   ALBUMIN 3.2* 3.3* 3.2* 3.4*   Recent Labs    05/01/20 1637 07/19/20 1450 07/19/20 2209 09/13/20 0000  WBC 6.8 10.8* 8.3 18.2  NEUTROABS 5.4 8.7*  --  15,252.00  HGB 14.0 14.4 13.5 15.9  HCT 41.6 43.1 40.2 47  MCV 94.5 92.9 92.4  --   PLT 171 185 172 252   Lab Results  Component Value Date   TSH 4.697 (H) 07/19/2020   Lab Results  Component Value Date   HGBA1C 8.3 (H) 10/28/2015   No  results found for: CHOL, HDL, LDLCALC, LDLDIRECT, TRIG, CHOLHDL  Significant Diagnostic Results in last 30 days:  CUP PACEART REMOTE DEVICE CHECK  Result Date: 09/24/2020 ILR summary report received. Battery status OK. Normal device function. No new symptom, tachy, brady, or pause episodes ( 1 pause stored addressed as alert- nocturnal). No new AF episodes. Monthly summary reports and ROV/PRN   Assessment/Plan  HTN (hypertension) Blood pressure is controlled, takes Amlodipine, Lisinopril Bun/creat 13/1.1 09/13/20  Hypothyroidism  takes Levothyroxine, TSH 4.697 07/20/20   Vascular dementia ([facility]) off Donepezil, Hospice service, resides in SNF [facility], no behavioral issues.   Syncope and collapse Syncope, stopped Labetalol, no recurrence reported   Diabetes mellitus ([place]) Continue Tresiba, SSI with meals.   Frequent falls Gait difficulty, the patient needs assistance for transfer, frequent falling due to attempting transferring self w/o calling for assistance.     Family/ staff Communication: plan of care reviewed with the patient and charge nurse.   Labs/tests ordered:  none  Time spend 25 minutes.

## 2020-10-18 NOTE — Assessment & Plan Note (Signed)
Continue Tresiba, SSI with meals.

## 2020-10-18 NOTE — Assessment & Plan Note (Signed)
off Donepezil, Hospice service, resides in SNF [facility], no behavioral issues.

## 2020-11-05 ENCOUNTER — Ambulatory Visit: Payer: Medicare PPO | Admitting: Internal Medicine

## 2020-11-06 ENCOUNTER — Encounter: Payer: Self-pay | Admitting: Nurse Practitioner

## 2020-11-06 ENCOUNTER — Non-Acute Institutional Stay (SKILLED_NURSING_FACILITY): Payer: Medicare Other | Admitting: Nurse Practitioner

## 2020-11-06 DIAGNOSIS — F0150 Vascular dementia without behavioral disturbance: Secondary | ICD-10-CM

## 2020-11-06 DIAGNOSIS — R296 Repeated falls: Secondary | ICD-10-CM

## 2020-11-06 DIAGNOSIS — E1122 Type 2 diabetes mellitus with diabetic chronic kidney disease: Secondary | ICD-10-CM

## 2020-11-06 DIAGNOSIS — T148XXA Other injury of unspecified body region, initial encounter: Secondary | ICD-10-CM

## 2020-11-06 DIAGNOSIS — N1831 Chronic kidney disease, stage 3a: Secondary | ICD-10-CM | POA: Diagnosis not present

## 2020-11-06 DIAGNOSIS — Z794 Long term (current) use of insulin: Secondary | ICD-10-CM

## 2020-11-06 DIAGNOSIS — R634 Abnormal weight loss: Secondary | ICD-10-CM

## 2020-11-06 DIAGNOSIS — E039 Hypothyroidism, unspecified: Secondary | ICD-10-CM

## 2020-11-06 DIAGNOSIS — I10 Essential (primary) hypertension: Secondary | ICD-10-CM | POA: Diagnosis not present

## 2020-11-06 DIAGNOSIS — R55 Syncope and collapse: Secondary | ICD-10-CM | POA: Diagnosis not present

## 2020-11-06 DIAGNOSIS — G4733 Obstructive sleep apnea (adult) (pediatric): Secondary | ICD-10-CM | POA: Diagnosis not present

## 2020-11-06 NOTE — Assessment & Plan Note (Signed)
off Donepezil, Hospice service  

## 2020-11-06 NOTE — Assessment & Plan Note (Signed)
stopped Labetalol, no recurrence reported 

## 2020-11-06 NOTE — Assessment & Plan Note (Signed)
Gait difficulty, the patient needs assistance for transfer, frequent falling due to attempting transferring self w/o calling for assistance.

## 2020-11-06 NOTE — Assessment & Plan Note (Signed)
#  5Ibs weight loss in the past 3 months, will f/u dietitian.

## 2020-11-06 NOTE — Assessment & Plan Note (Signed)
takes Amlodipine, Lisinopril Bun/creat 13/1.1 09/13/20

## 2020-11-06 NOTE — Assessment & Plan Note (Signed)
Tresiba, SSI, obtain Hgb a1c. Podiatry for foot exam/care

## 2020-11-06 NOTE — Assessment & Plan Note (Signed)
takes Levothyroxine, TSH 4.697 07/20/20  

## 2020-11-06 NOTE — Progress Notes (Signed)
Location:   [facility] Room Number: [place] of Service:  SNF (31) Provider:  [place] Otho Darner, NP    Patient Care Team: Harleen Fineberg X, NP as PCP - General (Internal Medicine) [name], MD as PCP - Cardiology (Cardiology) [name], MD as Consulting Physician (Cardiology)  Extended Emergency Contact Information Primary Emergency Contact: Fuertes,[name] Address: [address]          [place], [place] [address] [name] of [place] Phone: [phone] Mobile Phone: [phone] Relation: Spouse  Code Status: DNR    Goals of care: Advanced Directive information Advanced Directives 11/06/2020  Does Patient Have a Medical Advance Directive? Yes  Type of Advance Directive Out of facility DNR (pink MOST or yellow form)  Does patient want to make changes to medical advance directive? No - Patient declined  Copy of [place] in Chart? -  Would patient like information on creating a medical advance directive? -  Pre-existing out of facility DNR order (yellow form or pink MOST form) Yellow form placed in chart (order not valid for inpatient use);Pink MOST form placed in chart (order not valid for inpatient use)     Chief Complaint  Patient presents with  . Medical Management of Chronic Issues    Routine follow up.  Marland Kitchen Health Maintenance    Discuss need for foot exam, ophthalmology exam, hepatitis screening, hemoglobin a1c, and colonoscopy.     HPI:  Pt is a 80 y.o. male seen today for medical management of chronic diseases.    Gait difficulty, the patient needs assistance for transfer, frequent falling due to attempting transferring self w/o calling for assistance.  T2DM Tresiba, SSI, Hgb a1c 7.9 11/07/20 Syncope, stopped Labetalol, no recurrence reported HTN, takes Amlodipine, Lisinopril Bun/creat 13/1.1 09/13/20 Hypothyroidism, takes Levothyroxine, TSH 4.697  07/20/20 Dementia, off Donepezil, Hospice service    Past Medical History:  Diagnosis Date  . Aortic regurgitation    mild by echo 08/2019  . Carotid artery stenosis    1-39% bilateral stenosis by dopplers 08/2019  . Cataract   . Complication of anesthesia    "high blood sugar; he's been loopy last 36h since OR"- 2016  . Diabetes mellitus   . DKA, type 2 ([place])   . Hearing loss   . Hypercholesteremia   . Hypertension   . Insulin pump in place   . Macular degeneration   . MCI (mild cognitive impairment) with memory loss 12/12/2014  . OSA on CPAP 09/11/2014  . PONV (postoperative nausea and vomiting)   . Uses hearing aid    right ear, patient stated he lost left hearing aid    Past Surgical History:  Procedure Laterality Date  . CHOLECYSTECTOMY N/A 10/28/2015   Procedure: LAPAROSCOPIC CHOLECYSTECTOMY WITH   INTRAOPERATIVE CHOLANGIOGRAM;  Surgeon: Greer Pickerel, MD;  Location: [facility];  Service: General;  Laterality: N/A;  . SHOULDER ARTHROSCOPY Right 08/24/11   "frozen shoulder; RCR w/labrum tear; arthritis scraping; bone spurs"  . SHOULDER ARTHROSCOPY Left ~ 2006  . TONSILLECTOMY  1949   "in childhood"    No Known Allergies  Allergies as of 11/06/2020   No Known Allergies     Medication List       Accurate as of Nov 06, 2020 11:59 PM. If you have any questions, ask your nurse or doctor.        amLODipine 10 MG tablet Commonly known as: NORVASC Take 10 mg by mouth every evening.  aspirin EC 81 MG tablet Take 81 mg by mouth daily.   insulin aspart 100 UNIT/ML injection Commonly known as: novoLOG Inject into the skin 3 (three) times daily before meals. See sliding scale for CBG greater than 151.   levothyroxine 25 MCG tablet Commonly known as: SYNTHROID Take 25 mcg by mouth daily before breakfast.   lisinopril 20 MG tablet Commonly known as: ZESTRIL Take 1 tablet (20 mg total) by mouth daily.   multivitamin with minerals Tabs tablet Take 1 tablet by  mouth daily.   NON FORMULARY Take 1 tablet by mouth daily. B-6 vitamin   PRESERVISION AREDS PO Take 1 tablet by mouth daily.   [name] FlexTouch 100 UNIT/ML FlexTouch Pen Generic drug: insulin degludec Inject 15 Units into the skin in the morning. Per sliding scale       Review of Systems  Constitutional: Positive for unexpected weight change. Negative for fatigue and fever.       Weight loss about #5Ibs in the past 3 months.   HENT: Positive for hearing loss. Negative for congestion and voice change.   Eyes: Negative for visual disturbance.  Respiratory: Negative for cough.   Cardiovascular: Negative for leg swelling.  Gastrointestinal: Negative for abdominal pain and constipation.  Genitourinary: Negative for dysuria, hematuria and urgency.       Incontinent of urine about 1 week  Musculoskeletal: Positive for gait problem.  Skin: Negative for color change.       R buttock  Neurological: Negative for syncope, weakness and light-headedness.       Dementia  Psychiatric/Behavioral: Positive for confusion. Negative for sleep disturbance. The patient is not nervous/anxious.     Immunization History  Administered Date(s) Administered  . DTaP, 5 pertussis antigens 09/07/2016  . Influenza Split 05/09/2010, 03/02/2011, 03/21/2012, 04/06/2013, 03/26/2014  . Influenza, High Dose Seasonal PF 04/06/2013, 04/02/2016, 03/31/2017  . Influenza, Quadrivalent, Recombinant, Inj, Pf 03/14/2018, 03/06/2019, 05/15/2020  . Influenza,inj,Quad PF,6+ Mos 05/01/2015  . Moderna SARS-COV2 Booster Vaccination 04/30/2020  . Moderna Sars-Covid-2 Vaccination 06/26/2019, 07/24/2019  . Pneumococcal Conjugate-13 01/31/2014, 08/22/2015  . Pneumococcal Polysaccharide-23 05/21/2008, 05/23/2009  . Td 05/18/2007, 05/23/2009  . Tdap 03/03/2012  . Zoster 05/21/2008, 05/23/2009   Pertinent  Health Maintenance Due  Topic Date Due  . FOOT EXAM  Never done  . OPHTHALMOLOGY EXAM  Never done  . HEMOGLOBIN A1C   04/29/2016  . COLONOSCOPY (Pts 45-31yrs Insurance coverage will need to be confirmed)  04/13/2019  . INFLUENZA VACCINE  01/20/2021  . PNA vac Low Risk Adult  Completed   No flowsheet data found. Functional Status Survey:    Vitals:   11/06/20 0821  BP: 126/62  Pulse: 71  Resp: 20  Temp: (!) 97.5 F (36.4 C)  SpO2: 97%  Weight: 169 lb 8 oz (76.9 kg)  Height: 5\' 5"  (1.651 m)   Body mass index is 28.21 kg/m. Physical Exam Vitals and nursing note reviewed.  Constitutional:      Appearance: Normal appearance.  HENT:     Head: Normocephalic and atraumatic.     Nose: Nose normal.     Mouth/Throat:     Mouth: Mucous membranes are moist.  Eyes:     Extraocular Movements: Extraocular movements intact.     Conjunctiva/sclera: Conjunctivae normal.     Pupils: Pupils are equal, round, and reactive to light.  Cardiovascular:     Rate and Rhythm: Normal rate and regular rhythm.     Heart sounds: No murmur heard.   Pulmonary:  Effort: Pulmonary effort is normal.     Breath sounds: No rales.  Abdominal:     General: Bowel sounds are normal.     Palpations: Abdomen is soft.     Tenderness: There is no abdominal tenderness.  Musculoskeletal:     Cervical back: Normal range of motion and neck supple.     Right lower leg: No edema.     Left lower leg: No edema.  Skin:    General: Skin is warm and dry.     Comments: R buttock, excoriated, moist associated skin damage, Gel cushion, Wedge pillow, avoid pressure, moist, assist with incontinent care, uses barrier oint.   Neurological:     General: No focal deficit present.     Mental Status: He is alert.     Motor: No weakness.     Coordination: Coordination normal.     Gait: Gait abnormal.     Comments: Oriented to self.   Psychiatric:     Comments: Alert, followed simple directions     Labs reviewed: Recent Labs    07/20/20 0428 07/21/20 0359 08/02/20 1412 09/13/20 0000  NA 142 143 149* 137  K 2.9* 3.3* 4.0 4.4   CL 104 104 110* 102  CO2 29 28 23 22   GLUCOSE 155* 69* 246*  --   BUN 15 17 18 13   CREATININE 0.98 1.03 1.14 1.1  CALCIUM 8.7* 8.7* 9.2 8.6*  MG 2.3  --   --   --    Recent Labs    05/01/20 1637 07/19/20 1450 07/20/20 0428 09/13/20 0000  AST 23 104* 24 19  ALT 25 37 26 15  ALKPHOS 76 77 80 96  BILITOT 0.9 1.5* 0.6  --   PROT 6.4* 6.3* 6.3*  --   ALBUMIN 3.2* 3.3* 3.2* 3.4*   Recent Labs    05/01/20 1637 07/19/20 1450 07/19/20 2209 09/13/20 0000  WBC 6.8 10.8* 8.3 18.2  NEUTROABS 5.4 8.7*  --  15,252.00  HGB 14.0 14.4 13.5 15.9  HCT 41.6 43.1 40.2 47  MCV 94.5 92.9 92.4  --   PLT 171 185 172 252   Lab Results  Component Value Date   TSH 4.697 (H) 07/19/2020   Lab Results  Component Value Date   HGBA1C 8.3 (H) 10/28/2015   No results found for: CHOL, HDL, LDLCALC, LDLDIRECT, TRIG, CHOLHDL  Significant Diagnostic Results in last 30 days:  No results found.  Assessment/Plan Weight loss #5Ibs weight loss in the past 3 months, will f/u dietitian.   Hypothyroidism  takes Levothyroxine, TSH 4.697 07/20/20   Vascular dementia ([place]) off Donepezil, Hospice service  HTN (hypertension)  takes Amlodipine, Lisinopril Bun/creat 13/1.1 09/13/20   Syncope and collapse stopped Labetalol, no recurrence reported   Diabetes mellitus (HCC) Tresiba, SSI, obtain Hgb a1c. Podiatry for foot exam/care   Frequent falls Gait difficulty, the patient needs assistance for transfer, frequent falling due to attempting transferring self w/o calling for assistance.  Skin excoriation R buttock, excoriated, moist associated skin damage, Gel cushion, Wedge pillow, avoid pressure, moist, assist with incontinent care, uses barrier oint.       Family/ staff Communication: plan of care reviewed with the patient and charge nurse.   Labs/tests ordered:  Hgb a1c  Time spend 25 minutes.

## 2020-11-07 DIAGNOSIS — R21 Rash and other nonspecific skin eruption: Secondary | ICD-10-CM | POA: Insufficient documentation

## 2020-11-07 DIAGNOSIS — T148XXA Other injury of unspecified body region, initial encounter: Secondary | ICD-10-CM | POA: Insufficient documentation

## 2020-11-07 DIAGNOSIS — E119 Type 2 diabetes mellitus without complications: Secondary | ICD-10-CM | POA: Diagnosis not present

## 2020-11-07 LAB — HEMOGLOBIN A1C: Hemoglobin A1C: 7.9

## 2020-11-07 NOTE — Assessment & Plan Note (Signed)
R buttock, excoriated, moist associated skin damage, Gel cushion, Wedge pillow, avoid pressure, moist, assist with incontinent care, uses barrier oint.

## 2020-12-07 DIAGNOSIS — G4733 Obstructive sleep apnea (adult) (pediatric): Secondary | ICD-10-CM | POA: Diagnosis not present

## 2020-12-10 ENCOUNTER — Non-Acute Institutional Stay (SKILLED_NURSING_FACILITY): Payer: Medicare Other | Admitting: Internal Medicine

## 2020-12-10 DIAGNOSIS — Z794 Long term (current) use of insulin: Secondary | ICD-10-CM

## 2020-12-10 DIAGNOSIS — E039 Hypothyroidism, unspecified: Secondary | ICD-10-CM | POA: Diagnosis not present

## 2020-12-10 DIAGNOSIS — N1831 Chronic kidney disease, stage 3a: Secondary | ICD-10-CM | POA: Diagnosis not present

## 2020-12-10 DIAGNOSIS — E1122 Type 2 diabetes mellitus with diabetic chronic kidney disease: Secondary | ICD-10-CM

## 2020-12-10 DIAGNOSIS — F0150 Vascular dementia without behavioral disturbance: Secondary | ICD-10-CM

## 2020-12-10 DIAGNOSIS — I10 Essential (primary) hypertension: Secondary | ICD-10-CM | POA: Diagnosis not present

## 2020-12-10 NOTE — Progress Notes (Signed)
Location:  [facility] Room Number: 27 Place of Service:  SNF [phone])  Provider: Veleta Miners MD  Code Status: DNR Hospice Managed Care Goals of Care:  Advanced Directives 12/12/2020  Does Patient Have a Medical Advance Directive? Yes  Type of Advance Directive Out of facility DNR (pink MOST or yellow form)  Does patient want to make changes to medical advance directive? No - Patient declined  Copy of [place] in Chart? -  Would patient like information on creating a medical advance directive? -  Pre-existing out of facility DNR order (yellow form or pink MOST form) Yellow form placed in chart (order not valid for inpatient use);Pink MOST form placed in chart (order not valid for inpatient use)     Chief Complaint  Patient presents with   Medical Management of Chronic Issues   Health Maintenance    Foot and eye exam, Hep C screen, Shingrix, Hemoglobin A1C, Colonoscopy    HPI: Patient is a 80 y.o. male seen today for medical management of chronic diseases.    Patient has a history of diabetes type 2, hypertension, HLD, carotid stenosis history of OSA on CPAP Also has a history of unexplained syncope.s/p ILR Patient also has a history of dementia    Was admitted in SNF for increased help with ADLS Family did not want aggressive work up  Enrolled in [place] stabilized since he has been here Alert does respond but has aphasia. Does not follow much commands Is not ambulatory   Past Medical History:  Diagnosis Date   Aortic regurgitation    mild by echo 08/2019   Carotid artery stenosis    1-39% bilateral stenosis by dopplers 08/2019   Cataract    Complication of anesthesia    "high blood sugar; he's been loopy last 36h since OR"- 2016   Diabetes mellitus    DKA, type 2 ([place])    Hearing loss    Hypercholesteremia    Hypertension    Insulin pump in place    Macular degeneration    MCI (mild cognitive impairment) with  memory loss 12/12/2014   OSA on CPAP 09/11/2014   PONV (postoperative nausea and vomiting)    Uses hearing aid    right ear, patient stated he lost left hearing aid     Past Surgical History:  Procedure Laterality Date   CHOLECYSTECTOMY N/A 10/28/2015   Procedure: LAPAROSCOPIC CHOLECYSTECTOMY WITH   INTRAOPERATIVE CHOLANGIOGRAM;  Surgeon: Greer Pickerel, MD;  Location: [facility];  Service: General;  Laterality: N/A;   SHOULDER ARTHROSCOPY Right 08/24/11   "frozen shoulder; RCR w/labrum tear; arthritis scraping; bone spurs"   SHOULDER ARTHROSCOPY Left ~ 2006   TONSILLECTOMY  1949   "in childhood"    No Known Allergies  Outpatient Encounter Medications as of 12/10/2020  Medication Sig   amLODipine (NORVASC) 10 MG tablet Take 10 mg by mouth every evening.    aspirin EC 81 MG tablet Take 81 mg by mouth daily.   insulin aspart (NOVOLOG) 100 UNIT/ML injection Inject 5 Units into the skin once. With Lunch CBG greater than 151.   levothyroxine (SYNTHROID) 25 MCG tablet Take 25 mcg by mouth daily before breakfast.   lisinopril (ZESTRIL) 20 MG tablet Take 1 tablet (20 mg total) by mouth daily.   Multiple Vitamin (MULTIVITAMIN WITH MINERALS) TABS tablet Take 1 tablet by mouth daily.   Multiple Vitamins-Minerals (PRESERVISION AREDS PO) Take 1 tablet by mouth daily.    NON  FORMULARY Take 1 tablet by mouth daily. B-6 vitamin   TRESIBA FLEXTOUCH 100 UNIT/ML FlexTouch Pen Inject 25 Units into the skin in the morning. Per sliding scale   No facility-administered encounter medications on file as of 12/10/2020.    Review of Systems:  Review of Systems  Unable to perform ROS: Dementia   Health Maintenance  Topic Date Due   FOOT EXAM  Never done   OPHTHALMOLOGY EXAM  Never done   Hepatitis C Screening  Never done   Zoster Vaccines- Shingrix (1 of 2) Never done   COLONOSCOPY (Pts 45-53yrs Insurance coverage will need to be confirmed)  04/13/2019   INFLUENZA VACCINE  01/20/2021   COVID-19 Vaccine (5 -  Booster for Moderna series) 03/21/2021   HEMOGLOBIN A1C  05/10/2021   TETANUS/TDAP  03/03/2022   PNA vac Low Risk Adult  Completed   HPV VACCINES  Aged Out    Physical Exam: Vitals:   12/10/20 2011  BP: (!) 149/85  Pulse: 68  Resp: 18  Temp: 98.6 F (37 C)  Weight: 166 lb 6.4 oz (75.5 kg)   Body mass index is 27.69 kg/m. Physical Exam Vitals reviewed.  Constitutional:      Appearance: Normal appearance.     Comments: Has aphasia  HENT:     Head: Normocephalic.     Nose: Nose normal.     Mouth/Throat:     Mouth: Mucous membranes are moist.     Pharynx: Oropharynx is clear.  Eyes:     Pupils: Pupils are equal, round, and reactive to light.  Cardiovascular:     Rate and Rhythm: Normal rate and regular rhythm.     Pulses: Normal pulses.  Pulmonary:     Effort: Pulmonary effort is normal.     Breath sounds: Normal breath sounds.  Abdominal:     General: Abdomen is flat. Bowel sounds are normal.     Palpations: Abdomen is soft.  Musculoskeletal:        General: Swelling present.     Cervical back: Neck supple.  Skin:    General: Skin is warm.  Neurological:     Mental Status: He is alert.     Comments: Does respond but then gets distracted and does not follow commands  Psychiatric:        Mood and Affect: Mood normal.        Thought Content: Thought content normal.    Labs reviewed: Basic Metabolic Panel: Recent Labs    07/19/20 2209 07/20/20 0428 07/21/20 0359 08/02/20 1412 09/13/20 0000  NA  --  142 143 149* 137  K  --  2.9* 3.3* 4.0 4.4  CL  --  104 104 110* 102  CO2  --  29 28 23 22   GLUCOSE  --  155* 69* 246*  --   BUN  --  15 17 18 13   CREATININE 1.02 0.98 1.03 1.14 1.1  CALCIUM  --  8.7* 8.7* 9.2 8.6*  MG  --  2.3  --   --   --   TSH 4.697*  --   --   --   --    Liver Function Tests: Recent Labs    05/01/20 1637 07/19/20 1450 07/20/20 0428 09/13/20 0000  AST 23 104* 24 19  ALT 25 37 26 15  ALKPHOS 76 77 80 96  BILITOT 0.9 1.5* 0.6   --   PROT 6.4* 6.3* 6.3*  --   ALBUMIN 3.2* 3.3* 3.2* 3.4*  No results for input(s): LIPASE, AMYLASE in the last 8760 hours. No results for input(s): AMMONIA in the last 8760 hours. CBC: Recent Labs    05/01/20 1637 07/19/20 1450 07/19/20 2209 09/13/20 0000  WBC 6.8 10.8* 8.3 18.2  NEUTROABS 5.4 8.7*  --  15,252.00  HGB 14.0 14.4 13.5 15.9  HCT 41.6 43.1 40.2 47  MCV 94.5 92.9 92.4  --   PLT 171 185 172 252   Lipid Panel: No results for input(s): CHOL, HDL, LDLCALC, TRIG, CHOLHDL, LDLDIRECT in the last 8760 hours. Lab Results  Component Value Date   HGBA1C 7.9 11/07/2020    Procedures since last visit: No results found.  Assessment/Plan Type 2 diabetes mellitus with stage 3a chronic kidney disease, with long-term current use of insulin (HCC) Discontinue Sliding scale Novolog 5 units with lunch if CBG more then 125 Repeat A1C Vascular dementia without behavioral disturbance (HCC) Supportive care No Statin or any other aggressive work up per POA Enrolled in Hospice Primary hypertension Has been elevated but  he has h/o Syncope so Loose control Change Lisinopril to 30 mg Hypothyroidism, unspecified type Repeat TSH   Labs/tests ordered:  CBC,CMP,TSH in 2 weeks Next appt:  Visit date not found

## 2020-12-15 ENCOUNTER — Encounter: Payer: Self-pay | Admitting: Internal Medicine

## 2020-12-20 ENCOUNTER — Encounter: Payer: Self-pay | Admitting: Internal Medicine

## 2020-12-20 ENCOUNTER — Telehealth: Payer: Self-pay

## 2020-12-20 NOTE — Telephone Encounter (Signed)
I am not sure if someone needs to speak with the wife to explain why he was being monitor and if she really wants to stop the patient from being monitored since the monitor is automatic. He has a Lnq 1

## 2020-12-20 NOTE — Telephone Encounter (Signed)
Spoke with pt wife.  She states that patient is currently in skilled nursing care at [facility] due to his dementia.  She returned the monitor as patient does not know what to do with it and it is difficult to keep plugged in where he currently is.  Advised that without remote capability the only way we will be able to get data from ILR is in office.  If patient should have a syncopal episode that needs to be evaluated, let us know so that we can schedule an In office check of ILR.

## 2020-12-20 NOTE — Telephone Encounter (Signed)
Patient wife dropped off his monitor on 10/31/2020 because he has dementia and do not know which buttons to push.

## 2020-12-24 DIAGNOSIS — I10 Essential (primary) hypertension: Secondary | ICD-10-CM | POA: Diagnosis not present

## 2020-12-24 DIAGNOSIS — E119 Type 2 diabetes mellitus without complications: Secondary | ICD-10-CM | POA: Diagnosis not present

## 2020-12-25 LAB — CBC AND DIFFERENTIAL
HCT: 40 — AB (ref 41–53)
Hemoglobin: 13.3 — AB (ref 13.5–17.5)
Platelets: 175 (ref 150–399)
WBC: 4.9

## 2020-12-25 LAB — BASIC METABOLIC PANEL
BUN: 21 (ref 4–21)
CO2: 27 — AB (ref 13–22)
Chloride: 110 — AB (ref 99–108)
Creatinine: 1.1 (ref 0.6–1.3)
Glucose: 291
Potassium: 3.6 (ref 3.4–5.3)
Sodium: 144 (ref 137–147)

## 2020-12-25 LAB — TSH: TSH: 4.53 (ref 0.41–5.90)

## 2020-12-25 LAB — HEMOGLOBIN A1C: Hemoglobin A1C: 7.3

## 2020-12-25 LAB — CBC: RBC: 4.34 (ref 3.87–5.11)

## 2020-12-27 ENCOUNTER — Non-Acute Institutional Stay (SKILLED_NURSING_FACILITY): Payer: Medicare Other | Admitting: Nurse Practitioner

## 2020-12-27 ENCOUNTER — Encounter: Payer: Self-pay | Admitting: Nurse Practitioner

## 2020-12-27 DIAGNOSIS — F0150 Vascular dementia without behavioral disturbance: Secondary | ICD-10-CM

## 2020-12-27 DIAGNOSIS — E1110 Type 2 diabetes mellitus with ketoacidosis without coma: Secondary | ICD-10-CM

## 2020-12-27 DIAGNOSIS — E039 Hypothyroidism, unspecified: Secondary | ICD-10-CM | POA: Diagnosis not present

## 2020-12-27 DIAGNOSIS — I10 Essential (primary) hypertension: Secondary | ICD-10-CM

## 2020-12-27 DIAGNOSIS — R269 Unspecified abnormalities of gait and mobility: Secondary | ICD-10-CM

## 2020-12-27 DIAGNOSIS — R21 Rash and other nonspecific skin eruption: Secondary | ICD-10-CM | POA: Diagnosis not present

## 2020-12-27 NOTE — Assessment & Plan Note (Addendum)
takes Amlodipine, Lisinopril Bun/creat 21/1.11 12/24/20

## 2020-12-27 NOTE — Assessment & Plan Note (Signed)
Off meds, continue SNF [facility] for supportive care, continue Hospice service.

## 2020-12-27 NOTE — Progress Notes (Addendum)
Location:   [facility] Room Number: 29 Place of Service:  SNF (31) Provider: Lennie Odor Jovaun Levene NP  Modest Draeger X, NP  Patient Care Team: Ajdin Macke X, NP as PCP - General (Internal Medicine) [name], MD as PCP - Cardiology (Cardiology) [name], MD as Consulting Physician (Cardiology)  Extended Emergency Contact Information Primary Emergency Contact: Turay,[name] Address: [address]          [place], [place] [address] [place] Phone: [phone] Mobile Phone: [phone] Relation: Spouse  Code Status: DNR Goals of care: Advanced Directive information Advanced Directives 12/12/2020  Does Patient Have a Medical Advance Directive? Yes  Type of Advance Directive Out of facility DNR (pink MOST or yellow form)  Does patient want to make changes to medical advance directive? No - Patient declined  Copy of [place] in Chart? -  Would patient like information on creating a medical advance directive? -  Pre-existing out of facility DNR order (yellow form or pink MOST form) Yellow form placed in chart (order not valid for inpatient use);Pink MOST form placed in chart (order not valid for inpatient use)     Chief Complaint  Patient presents with   Acute Visit    Low blood sugar    HPI:  Pt is a 80 y.o. male seen today for an acute visit for low CBG 49 4am, 58 5am, 114 6am, 173 7am, glucose gel, orange juice given.   Gait difficulty, the patient needs assistance for transfer, frequent falling due to attempting transferring self w/o calling for assistance.             T2DM Tresiba 25u qd Novolog 5 u with lunch CBG>125, Hgb a1c 7.9 11/07/20, Hgb a1c 7.3 12/24/20             Syncope, stopped Labetalol, no recurrence reported             HTN, takes Amlodipine, Lisinopril Bun/creat 21/1.11 12/24/20             Hypothyroidism, takes Levothyroxine, TSH 4.53 12/24/20             Dementia, off Donepezil, Hospice service   Past  Medical History:  Diagnosis Date   Aortic regurgitation    mild by echo 08/2019   Carotid artery stenosis    1-39% bilateral stenosis by dopplers 08/2019   Cataract    Complication of anesthesia    "high blood sugar; he's been loopy last 36h since OR"- 2016   Diabetes mellitus    DKA, type 2 ([place])    Hearing loss    Hypercholesteremia    Hypertension    Insulin pump in place    Macular degeneration    MCI (mild cognitive impairment) with memory loss 12/12/2014   OSA on CPAP 09/11/2014   PONV (postoperative nausea and vomiting)    Uses hearing aid    right ear, patient stated he lost left hearing aid    Past Surgical History:  Procedure Laterality Date   CHOLECYSTECTOMY N/A 10/28/2015   Procedure: LAPAROSCOPIC CHOLECYSTECTOMY WITH   INTRAOPERATIVE CHOLANGIOGRAM;  Surgeon: Greer Pickerel, MD;  Location: [facility];  Service: General;  Laterality: N/A;   SHOULDER ARTHROSCOPY Right 08/24/11   "frozen shoulder; RCR w/labrum tear; arthritis scraping; bone spurs"   SHOULDER ARTHROSCOPY Left ~ 2006   TONSILLECTOMY  1949   "in childhood"    No Known Allergies  Allergies as of 12/27/2020  No Known Allergies      Medication List        Accurate as of December 27, 2020  2:15 PM. If you have any questions, ask your nurse or doctor.          amLODipine 10 MG tablet Commonly known as: NORVASC Take 10 mg by mouth every evening.   aspirin EC 81 MG tablet Take 81 mg by mouth daily.   insulin aspart 100 UNIT/ML injection Commonly known as: novoLOG Inject 5 Units into the skin once. With Lunch CBG greater than 151.   levothyroxine 25 MCG tablet Commonly known as: SYNTHROID Take 25 mcg by mouth daily before breakfast.   lisinopril 20 MG tablet Commonly known as: ZESTRIL Take 1 tablet (20 mg total) by mouth daily.   multivitamin with minerals Tabs tablet Take 1 tablet by mouth daily.   NON FORMULARY Take 1 tablet by mouth daily. B-6 vitamin   PRESERVISION AREDS PO Take 1 tablet by  mouth daily.   [name] FlexTouch 100 UNIT/ML FlexTouch Pen Generic drug: insulin degludec Inject 25 Units into the skin in the morning. Per sliding scale        Review of Systems  Constitutional:  Negative for activity change, appetite change and fever.  HENT:  Positive for hearing loss. Negative for congestion and voice change.   Eyes:  Negative for visual disturbance.  Respiratory:  Negative for cough.   Cardiovascular:  Negative for leg swelling.  Gastrointestinal:  Negative for abdominal pain and constipation.  Genitourinary:  Negative for dysuria, hematuria and urgency.  Musculoskeletal:  Positive for gait problem.  Skin:  Positive for rash. Negative for color change.       Reddened with superficial excoriated spots R+L buttock. Small reddened spots on penis   Neurological:  Negative for syncope, weakness and light-headedness.       Dementia  Psychiatric/Behavioral:  Positive for confusion. Negative for sleep disturbance. The patient is not nervous/anxious.    Immunization History  Administered Date(s) Administered   DTaP, 5 pertussis antigens 09/07/2016   Influenza Split 05/09/2010, 03/02/2011, 03/21/2012, 04/06/2013, 03/26/2014, 04/03/2020   Influenza, High Dose Seasonal PF 04/06/2013, 04/02/2016, 03/31/2017   Influenza, Quadrivalent, Recombinant, Inj, Pf 03/14/2018, 03/06/2019, 05/15/2020   Influenza,inj,Quad PF,6+ Mos 05/01/2015   Moderna SARS-COV2 Booster Vaccination 04/30/2020, 11/19/2020   Moderna Sars-Covid-2 Vaccination 06/26/2019, 07/24/2019   Pneumococcal Conjugate-13 01/31/2014, 08/22/2015   Pneumococcal Polysaccharide-23 05/21/2008, 05/23/2009   Td 05/18/2007, 05/23/2009   Tdap 03/03/2012   Zoster, Live 05/21/2008, 05/23/2009   Pertinent  Health Maintenance Due  Topic Date Due   FOOT EXAM  Never done   OPHTHALMOLOGY EXAM  Never done   COLONOSCOPY (Pts 45-83yrs Insurance coverage will need to be confirmed)  04/13/2019   INFLUENZA VACCINE  01/20/2021    HEMOGLOBIN A1C  05/10/2021   PNA vac Low Risk Adult  Completed   No flowsheet data found. Functional Status Survey:    Vitals:   12/27/20 1159  BP: 136/74  Pulse: 87  Resp: 18  Temp: 97.9 F (36.6 C)  SpO2: 96%   There is no height or weight on file to calculate BMI. Physical Exam Vitals and nursing note reviewed.  Constitutional:      Appearance: Normal appearance.  HENT:     Head: Normocephalic and atraumatic.     Nose: Nose normal.     Mouth/Throat:     Mouth: Mucous membranes are moist.  Eyes:     Extraocular Movements: Extraocular movements intact.  Conjunctiva/sclera: Conjunctivae normal.     Pupils: Pupils are equal, round, and reactive to light.  Cardiovascular:     Rate and Rhythm: Normal rate and regular rhythm.     Heart sounds: No murmur heard. Pulmonary:     Effort: Pulmonary effort is normal.     Breath sounds: No rales.  Abdominal:     General: Bowel sounds are normal.     Palpations: Abdomen is soft.     Tenderness: There is no abdominal tenderness.  Musculoskeletal:     Cervical back: Normal range of motion and neck supple.     Right lower leg: No edema.     Left lower leg: No edema.  Skin:    General: Skin is warm and dry.     Findings: Erythema and rash present.     Comments: Reddened with superficial excoriated spots R+L buttock. Small reddened spots on penis    Neurological:     General: No focal deficit present.     Mental Status: He is alert.     Motor: No weakness.     Coordination: Coordination normal.     Gait: Gait abnormal.     Comments: Oriented to self.   Psychiatric:     Comments: Alert, followed simple directions    Labs reviewed: Recent Labs    07/20/20 0428 07/21/20 0359 08/02/20 1412 09/13/20 0000  NA 142 143 149* 137  K 2.9* 3.3* 4.0 4.4  CL 104 104 110* 102  CO2 29 28 23 22   GLUCOSE 155* 69* 246*  --   BUN 15 17 18 13   CREATININE 0.98 1.03 1.14 1.1  CALCIUM 8.7* 8.7* 9.2 8.6*  MG 2.3  --   --   --     Recent Labs    05/01/20 1637 07/19/20 1450 07/20/20 0428 09/13/20 0000  AST 23 104* 24 19  ALT 25 37 26 15  ALKPHOS 76 77 80 96  BILITOT 0.9 1.5* 0.6  --   PROT 6.4* 6.3* 6.3*  --   ALBUMIN 3.2* 3.3* 3.2* 3.4*   Recent Labs    05/01/20 1637 07/19/20 1450 07/19/20 2209 09/13/20 0000  WBC 6.8 10.8* 8.3 18.2  NEUTROABS 5.4 8.7*  --  15,252.00  HGB 14.0 14.4 13.5 15.9  HCT 41.6 43.1 40.2 47  MCV 94.5 92.9 92.4  --   PLT 171 185 172 252   Lab Results  Component Value Date   TSH 4.697 (H) 07/19/2020   Lab Results  Component Value Date   HGBA1C 7.9 11/07/2020   No results found for: CHOL, HDL, LDLCALC, LDLDIRECT, TRIG, CHOLHDL  Significant Diagnostic Results in last 30 days:  No results found.  Assessment/Plan: Diabetic ketoacidosis without coma associated with type 2 diabetes mellitus (HCC) low CBG 49 4am, 58 5am, 114 6am, 173 7am, glucose gel, orange juice given. Decrease Tresiba 22u qd, continue  Novolog 5 u with lunch CBG>125, Hgb a1c 7.9 11/07/20, 7.3 12/24/20, adding hs snack.   Gait abnormality Gait difficulty, the patient needs assistance for transfer, frequent falling due to attempting transferring self w/o calling for assistance.  HTN (hypertension) takes Amlodipine, Lisinopril Bun/creat 21/1.11 12/24/20  Hypothyroidism  takes Levothyroxine, TSH 4.53 12/24/20  Vascular dementia ([place]) Off meds, continue SNF [facility] for supportive care, continue Hospice service.   Excoriated rash Reddened with superficial excoriated spots R+L buttock. Small reddened spots on penis     Family/ staff Communication: plan of care reviewed with the patient and charge nurse.  Labs/tests ordered:  none  Time spend 35 minutes.

## 2020-12-27 NOTE — Assessment & Plan Note (Signed)
takes Levothyroxine, TSH 4.53 12/24/20

## 2020-12-27 NOTE — Assessment & Plan Note (Addendum)
low CBG 49 4am, 58 5am, 114 6am, 173 7am, glucose gel, orange juice given. Decrease Tresiba 22u qd, continue  Novolog 5 u with lunch CBG>125, Hgb a1c 7.9 11/07/20, 7.3 12/24/20, adding hs snack.

## 2020-12-27 NOTE — Assessment & Plan Note (Signed)
Reddened with superficial excoriated spots R+L buttock. Small reddened spots on penis

## 2020-12-27 NOTE — Assessment & Plan Note (Signed)
Gait difficulty, the patient needs assistance for transfer, frequent falling due to attempting transferring self w/o calling for assistance.

## 2021-01-06 DIAGNOSIS — G4733 Obstructive sleep apnea (adult) (pediatric): Secondary | ICD-10-CM | POA: Diagnosis not present

## 2021-01-08 ENCOUNTER — Non-Acute Institutional Stay (SKILLED_NURSING_FACILITY): Payer: Medicare Other | Admitting: Nurse Practitioner

## 2021-01-08 ENCOUNTER — Encounter: Payer: Self-pay | Admitting: Nurse Practitioner

## 2021-01-08 DIAGNOSIS — R55 Syncope and collapse: Secondary | ICD-10-CM

## 2021-01-08 DIAGNOSIS — E039 Hypothyroidism, unspecified: Secondary | ICD-10-CM

## 2021-01-08 DIAGNOSIS — R21 Rash and other nonspecific skin eruption: Secondary | ICD-10-CM

## 2021-01-08 DIAGNOSIS — I10 Essential (primary) hypertension: Secondary | ICD-10-CM

## 2021-01-08 DIAGNOSIS — F0150 Vascular dementia without behavioral disturbance: Secondary | ICD-10-CM

## 2021-01-08 DIAGNOSIS — R269 Unspecified abnormalities of gait and mobility: Secondary | ICD-10-CM | POA: Diagnosis not present

## 2021-01-08 NOTE — Assessment & Plan Note (Signed)
T2DM Tresiba 22u qd Novolog 5 u with lunch CBG>125, Hgb a1c 7.9 11/07/20, Hgb a1c 7.3 12/24/20, hs snack to avoid low CBG in am.

## 2021-01-08 NOTE — Assessment & Plan Note (Signed)
takes Levothyroxine, TSH 4.53 12/24/20

## 2021-01-08 NOTE — Assessment & Plan Note (Signed)
Syncope, stopped Labetalol, no recurrence reported

## 2021-01-08 NOTE — Assessment & Plan Note (Signed)
off Donepezil, Hospice service, continue SNF [facility] for supportive care.

## 2021-01-08 NOTE — Assessment & Plan Note (Signed)
Near resolution

## 2021-01-08 NOTE — Assessment & Plan Note (Signed)
Gait difficulty, the patient needs assistance for transfer, frequent falling due to attempting transferring self w/o calling for assistance.

## 2021-01-08 NOTE — Progress Notes (Signed)
Location:   SNF [facility] Room Number: N027 Place of Service:  SNF (31) Provider: Lennie Odor Yehudis Monceaux NP  Caetano Oberhaus X, NP  Patient Care Team: Carola Viramontes X, NP as PCP - General (Internal Medicine) [name], MD as PCP - Cardiology (Cardiology) [name], MD as Consulting Physician (Cardiology)  Extended Emergency Contact Information Primary Emergency Contact: [place]land,[name] Address: [address]          [place], [place] [address] [name] Phone: [phone] Mobile Phone: [phone] Relation: Spouse  Code Status:  DNR Goals of care: Advanced Directive information Advanced Directives 01/08/2021  Does Patient Have a Medical Advance Directive? Yes  Type of Advance Directive Out of facility DNR (pink MOST or yellow form)  Does patient want to make changes to medical advance directive? No - Patient declined  Copy of [place] in Chart? -  Would patient like information on creating a medical advance directive? -  Pre-existing out of facility DNR order (yellow form or pink MOST form) Yellow form placed in chart (order not valid for inpatient use)     Chief Complaint  Patient presents with   Medical Management of Chronic Issues    Routine follow up    Health Maintenance    Discuss need for foot exam, ophthalmology exam, hepatitis c screening, colonoscopy, and shingles vaccine.     HPI:  Pt is a 80 y.o. male seen today for medical management of chronic diseases.       Gait difficulty, the patient needs assistance for transfer, frequent falling due to attempting transferring self w/o calling for assistance.             T2DM Tresiba 22u qd Novolog 5 u with lunch CBG>125, Hgb a1c 7.9 11/07/20, Hgb a1c 7.3 12/24/20, hs snack to avoid low CBG in am.              Syncope, stopped Labetalol, no recurrence reported             HTN, takes Amlodipine, Lisinopril Bun/creat 21/1.11 12/24/20             Hypothyroidism, takes Levothyroxine,  TSH 4.53 12/24/20             Dementia, off Donepezil, Hospice service   Past Medical History:  Diagnosis Date   Aortic regurgitation    mild by echo 08/2019   Carotid artery stenosis    1-39% bilateral stenosis by dopplers 08/2019   Cataract    Complication of anesthesia    "high blood sugar; he's been loopy last 36h since OR"- 2016   Diabetes mellitus    DKA, type 2 ([place])    Hearing loss    Hypercholesteremia    Hypertension    Insulin pump in place    Macular degeneration    MCI (mild cognitive impairment) with memory loss 12/12/2014   OSA on CPAP 09/11/2014   PONV (postoperative nausea and vomiting)    Uses hearing aid    right ear, patient stated he lost left hearing aid    Past Surgical History:  Procedure Laterality Date   CHOLECYSTECTOMY N/A 10/28/2015   Procedure: LAPAROSCOPIC CHOLECYSTECTOMY WITH   INTRAOPERATIVE CHOLANGIOGRAM;  Surgeon: Greer Pickerel, MD;  Location: [facility];  Service: General;  Laterality: N/A;   SHOULDER ARTHROSCOPY Right 08/24/11   "frozen shoulder; RCR w/labrum tear; arthritis scraping; bone spurs"   SHOULDER ARTHROSCOPY Left ~ 2006   TONSILLECTOMY  1949   "  in childhood"    No Known Allergies  Allergies as of 01/08/2021   No Known Allergies      Medication List        Accurate as of January 08, 2021  4:05 PM. If you have any questions, ask your nurse or doctor.          STOP taking these medications    multivitamin with minerals Tabs tablet Stopped by: Caren Garske X Toddy Boyd, NP   NON FORMULARY Stopped by: Rajesh Wyss X Sabena Winner, NP   PRESERVISION AREDS PO Stopped by: Tayva Easterday X Shamekia Tippets, NP       TAKE these medications    amLODipine 10 MG tablet Commonly known as: NORVASC Take 10 mg by mouth every evening.   aspirin EC 81 MG tablet Take 81 mg by mouth daily.   insulin aspart 100 UNIT/ML injection Commonly known as: novoLOG Inject 5 Units into the skin once. With Lunch CBG greater than 151.   levothyroxine 25 MCG tablet Commonly known as: SYNTHROID Take  25 mcg by mouth daily before breakfast.   lisinopril 20 MG tablet Commonly known as: ZESTRIL Take 1 tablet (20 mg total) by mouth daily.   [name] FlexTouch 100 UNIT/ML FlexTouch Pen Generic drug: insulin degludec Inject 25 Units into the skin in the morning. Per sliding scale        Review of Systems  Constitutional:  Negative for activity change, appetite change and fever.  HENT:  Positive for hearing loss. Negative for congestion and voice change.   Eyes:  Negative for visual disturbance.  Respiratory:  Negative for cough.   Cardiovascular:  Negative for leg swelling.  Gastrointestinal:  Negative for abdominal pain and constipation.  Genitourinary:  Negative for dysuria, hematuria and urgency.  Musculoskeletal:  Positive for gait problem.  Skin:  Negative for color change.  Neurological:  Negative for syncope, weakness and light-headedness.       Dementia  Psychiatric/Behavioral:  Positive for confusion. Negative for sleep disturbance. The patient is not nervous/anxious.    Immunization History  Administered Date(s) Administered   DTaP, 5 pertussis antigens 09/07/2016   Influenza Split 05/09/2010, 03/02/2011, 03/21/2012, 04/06/2013, 03/26/2014, 04/03/2020   Influenza, High Dose Seasonal PF 04/06/2013, 04/02/2016, 03/31/2017   Influenza, Quadrivalent, Recombinant, Inj, Pf 03/14/2018, 03/06/2019, 05/15/2020   Influenza,inj,Quad PF,6+ Mos 05/01/2015   Moderna SARS-COV2 Booster Vaccination 04/30/2020, 11/19/2020   Moderna Sars-Covid-2 Vaccination 06/26/2019, 07/24/2019   Pneumococcal Conjugate-13 01/31/2014, 08/22/2015   Pneumococcal Polysaccharide-23 05/21/2008, 05/23/2009   Td 05/18/2007, 05/23/2009   Tdap 03/03/2012   Zoster, Live 05/21/2008, 05/23/2009   Pertinent  Health Maintenance Due  Topic Date Due   FOOT EXAM  Never done   OPHTHALMOLOGY EXAM  Never done   COLONOSCOPY (Pts 45-66yrs Insurance coverage will need to be confirmed)  04/13/2019   INFLUENZA VACCINE   01/20/2021   HEMOGLOBIN A1C  05/10/2021   PNA vac Low Risk Adult  Completed   No flowsheet data found. Functional Status Survey:    Vitals:   01/08/21 1452  BP: 130/80  Pulse: 88  Resp: 18  Temp: 98 F (36.7 C)  SpO2: 97%  Weight: 160 lb (72.6 kg)  Height: 5\' 5"  (1.651 m)   Body mass index is 26.63 kg/m. Physical Exam Vitals and nursing note reviewed.  Constitutional:      Appearance: Normal appearance.  HENT:     Head: Normocephalic and atraumatic.     Nose: Nose normal.     Mouth/Throat:     Mouth: Mucous membranes  are moist.  Eyes:     Extraocular Movements: Extraocular movements intact.     Conjunctiva/sclera: Conjunctivae normal.     Pupils: Pupils are equal, round, and reactive to light.  Cardiovascular:     Rate and Rhythm: Normal rate and regular rhythm.     Heart sounds: No murmur heard. Pulmonary:     Effort: Pulmonary effort is normal.     Breath sounds: No rales.  Abdominal:     General: Bowel sounds are normal.     Palpations: Abdomen is soft.     Tenderness: There is no abdominal tenderness.  Musculoskeletal:     Cervical back: Normal range of motion and neck supple.     Right lower leg: No edema.     Left lower leg: No edema.  Skin:    General: Skin is warm and dry.     Comments: Reddened with superficial excoriated spots R+L buttock. Small reddened spots on penis-near healed.    Neurological:     General: No focal deficit present.     Mental Status: He is alert.     Motor: No weakness.     Coordination: Coordination normal.     Gait: Gait abnormal.     Comments: Oriented to self.   Psychiatric:     Comments: Alert, followed simple directions    Labs reviewed: Recent Labs    07/20/20 0428 07/21/20 0359 08/02/20 1412 09/13/20 0000  NA 142 143 149* 137  K 2.9* 3.3* 4.0 4.4  CL 104 104 110* 102  CO2 29 28 23 22   GLUCOSE 155* 69* 246*  --   BUN 15 17 18 13   CREATININE 0.98 1.03 1.14 1.1  CALCIUM 8.7* 8.7* 9.2 8.6*  MG 2.3  --    --   --    Recent Labs    05/01/20 1637 07/19/20 1450 07/20/20 0428 09/13/20 0000  AST 23 104* 24 19  ALT 25 37 26 15  ALKPHOS 76 77 80 96  BILITOT 0.9 1.5* 0.6  --   PROT 6.4* 6.3* 6.3*  --   ALBUMIN 3.2* 3.3* 3.2* 3.4*   Recent Labs    05/01/20 1637 07/19/20 1450 07/19/20 2209 09/13/20 0000  WBC 6.8 10.8* 8.3 18.2  NEUTROABS 5.4 8.7*  --  15,252.00  HGB 14.0 14.4 13.5 15.9  HCT 41.6 43.1 40.2 47  MCV 94.5 92.9 92.4  --   PLT 171 185 172 252   Lab Results  Component Value Date   TSH 4.697 (H) 07/19/2020   Lab Results  Component Value Date   HGBA1C 7.9 11/07/2020   No results found for: CHOL, HDL, LDLCALC, LDLDIRECT, TRIG, CHOLHDL  Significant Diagnostic Results in last 30 days:  No results found.  Assessment/Plan  Gait abnormality Gait difficulty, the patient needs assistance for transfer, frequent falling due to attempting transferring self w/o calling for assistance.  Type 2 diabetes mellitus with vascular disease ([place]) T2DM Tresiba 22u qd Novolog 5 u with lunch CBG>125, Hgb a1c 7.9 11/07/20, Hgb a1c 7.3 12/24/20, hs snack to avoid low CBG in am.   Excoriated rash Near resolution  Syncope with normal neurologic examination Syncope, stopped Labetalol, no recurrence reported  HTN (hypertension) Blood pressure is controlled, takes Amlodipine, Lisinopril Bun/creat 21/1.11 12/24/20  Hypothyroidism takes Levothyroxine, TSH 4.53 12/24/20  Vascular dementia ([place]) off Donepezil, Hospice service, continue SNF [facility] for supportive care.    Family/ staff Communication: plan of care reviewed with the patient and charge nurse.   Labs/tests  ordered:  none  Time spend 25 minutes.  

## 2021-01-08 NOTE — Assessment & Plan Note (Signed)
Blood pressure is controlled, takes Amlodipine, Lisinopril Bun/creat 21/1.11 12/24/20

## 2021-01-30 ENCOUNTER — Encounter: Payer: Self-pay | Admitting: Nurse Practitioner

## 2021-01-30 ENCOUNTER — Non-Acute Institutional Stay (SKILLED_NURSING_FACILITY): Payer: Medicare Other | Admitting: Nurse Practitioner

## 2021-01-30 DIAGNOSIS — E039 Hypothyroidism, unspecified: Secondary | ICD-10-CM | POA: Diagnosis not present

## 2021-01-30 DIAGNOSIS — I10 Essential (primary) hypertension: Secondary | ICD-10-CM

## 2021-01-30 DIAGNOSIS — R269 Unspecified abnormalities of gait and mobility: Secondary | ICD-10-CM

## 2021-01-30 DIAGNOSIS — E1159 Type 2 diabetes mellitus with other circulatory complications: Secondary | ICD-10-CM

## 2021-01-30 DIAGNOSIS — F0150 Vascular dementia without behavioral disturbance: Secondary | ICD-10-CM

## 2021-01-30 NOTE — Progress Notes (Signed)
This encounter was created in error - please disregard.

## 2021-01-30 NOTE — Assessment & Plan Note (Signed)
Blood pressure is controlled, takes Amlodipine, Lisinopril Bun/creat 21/1.11 12/24/20

## 2021-01-30 NOTE — Assessment & Plan Note (Signed)
takes Levothyroxine, TSH 4.53 12/24/20

## 2021-01-30 NOTE — Assessment & Plan Note (Signed)
Tresiba 20u qd Novolog 5 u with lunch CBG>125, Hgb a1c 7.9 11/07/20, Hgb a1c 7.3 12/24/20, hs snack to avoid low CBG in am.

## 2021-01-30 NOTE — Assessment & Plan Note (Signed)
off Donepezil, Hospice service, continue SNF [facility] for supportive care.

## 2021-01-30 NOTE — Progress Notes (Signed)
Location:   [place] Room Number: [place] of Service:  SNF (31) Provider:  Yaqub Arney X Harly Pipkins, NP  Karmyn Lowman X, NP  Patient Care Team: Anderson Coppock X, NP as PCP - General (Internal Medicine) [name], MD as PCP - Cardiology (Cardiology) [name], MD as Consulting Physician (Cardiology)  Extended Emergency Contact Information Primary Emergency Contact: Bier,[name] Address: [address]          [place], [place] [address] [name] of [place] Phone: [phone] Mobile Phone: [phone] Relation: Spouse  Code Status:  DNR Goals of care: Advanced Directive information Advanced Directives 01/30/2021  Does Patient Have a Medical Advance Directive? Yes  Type of Advance Directive Out of facility DNR (pink MOST or yellow form)  Does patient want to make changes to medical advance directive? No - Patient declined  Copy of [place] in Chart? -  Would patient like information on creating a medical advance directive? -  Pre-existing out of facility DNR order (yellow form or pink MOST form) Yellow form placed in chart (order not valid for inpatient use);Pink MOST form placed in chart (order not valid for inpatient use)     Chief Complaint  Patient presents with   Medical Management of Chronic Issues    Routine follow up   Health Maintenance    Discuss need for foot exam, ophthalmology exam, hepatitis c screening, shingles vaccine, colonoscopy, and updated influenza vaccine.     HPI:  Pt is a 80 y.o. male seen today for medical management of chronic diseases.     Gait difficulty, the patient needs assistance for transfer, frequent falling due to attempting transferring self w/o calling for assistance.             T2DM Tresiba 20u qd Novolog 5 u with lunch CBG>125, Hgb a1c 7.9 11/07/20, Hgb a1c 7.3 12/24/20, hs snack to avoid low CBG in am.              Syncope, stopped Labetalol, no recurrence reported              HTN, takes Amlodipine, Lisinopril Bun/creat 21/1.11 12/24/20             Hypothyroidism, takes Levothyroxine, TSH 4.53 12/24/20             Dementia, off Donepezil, Hospice service   Past Medical History:  Diagnosis Date   Aortic regurgitation    mild by echo 08/2019   Carotid artery stenosis    1-39% bilateral stenosis by dopplers 08/2019   Cataract    Complication of anesthesia    "high blood sugar; he's been loopy last 36h since OR"- 2016   Diabetes mellitus    DKA, type 2 ([place])    Hearing loss    Hypercholesteremia    Hypertension    Insulin pump in place    Macular degeneration    MCI (mild cognitive impairment) with memory loss 12/12/2014   OSA on CPAP 09/11/2014   PONV (postoperative nausea and vomiting)    Uses hearing aid    right ear, patient stated he lost left hearing aid    Past Surgical History:  Procedure Laterality Date   CHOLECYSTECTOMY N/A 10/28/2015   Procedure: LAPAROSCOPIC CHOLECYSTECTOMY WITH   INTRAOPERATIVE CHOLANGIOGRAM;  Surgeon: Greer Pickerel, MD;  Location: [facility];  Service: General;  Laterality: N/A;   SHOULDER ARTHROSCOPY Right 08/24/11   "frozen shoulder; RCR w/labrum tear; arthritis scraping; bone spurs"  SHOULDER ARTHROSCOPY Left ~ 2006   TONSILLECTOMY  1949   "in childhood"    No Known Allergies  Allergies as of 01/30/2021   No Known Allergies      Medication List        Accurate as of January 30, 2021 11:59 PM. If you have any questions, ask your nurse or doctor.          amLODipine 10 MG tablet Commonly known as: NORVASC Take 10 mg by mouth every evening.   aspirin EC 81 MG tablet Take 81 mg by mouth daily.   insulin aspart 100 UNIT/ML injection Commonly known as: novoLOG Inject 4-8 Units into the skin 3 (three) times daily with meals. If blood sugar is 120-200, give 4 units If blood sugar is 201-300, give 6 units If blood sugar is 301-400, give 8 units If blood sugar is greater than 400, call MD   levothyroxine 25 MCG  tablet Commonly known as: SYNTHROID Take 25 mcg by mouth daily before breakfast.   lisinopril 30 MG tablet Commonly known as: ZESTRIL Take 30 mg by mouth daily. What changed: Another medication with the same name was removed. Continue taking this medication, and follow the directions you see here. Changed by: Tiffanni Scarfo X Countess Biebel, NP   nystatin-triamcinolone cream Commonly known as: MYCOLOG II Apply 1 application topically 2 (two) times daily.   [name] FlexTouch 100 UNIT/ML FlexTouch Pen Generic drug: insulin degludec Inject 20 Units into the skin in the morning. Per sliding scale        Review of Systems  Constitutional:  Negative for activity change, fatigue, fever and unexpected weight change.  HENT:  Positive for hearing loss. Negative for congestion and voice change.   Eyes:  Negative for visual disturbance.  Respiratory:  Negative for cough.   Cardiovascular:  Negative for leg swelling.  Gastrointestinal:  Negative for abdominal pain and constipation.  Genitourinary:  Negative for dysuria, hematuria and urgency.  Musculoskeletal:  Positive for gait problem.  Skin:  Negative for color change.  Neurological:  Negative for syncope, weakness and light-headedness.       Dementia  Psychiatric/Behavioral:  Positive for confusion. Negative for sleep disturbance. The patient is not nervous/anxious.    Immunization History  Administered Date(s) Administered   DTaP, 5 pertussis antigens 09/07/2016   Influenza Split 05/09/2010, 03/02/2011, 03/21/2012, 04/06/2013, 03/26/2014, 04/03/2020   Influenza, High Dose Seasonal PF 04/06/2013, 04/02/2016, 03/31/2017   Influenza, Quadrivalent, Recombinant, Inj, Pf 03/14/2018, 03/06/2019, 05/15/2020   Influenza,inj,Quad PF,6+ Mos 05/01/2015   Moderna SARS-COV2 Booster Vaccination 04/30/2020, 11/19/2020   Moderna Sars-Covid-2 Vaccination 06/26/2019, 07/24/2019   Pneumococcal Conjugate-13 01/31/2014, 08/22/2015   Pneumococcal Polysaccharide-23  05/21/2008, 05/23/2009   Td 05/18/2007, 05/23/2009   Tdap 03/03/2012   Zoster, Live 05/21/2008, 05/23/2009   Pertinent  Health Maintenance Due  Topic Date Due   FOOT EXAM  Never done   OPHTHALMOLOGY EXAM  Never done   COLONOSCOPY (Pts 45-18yr Insurance coverage will need to be confirmed)  04/13/2019   INFLUENZA VACCINE  01/20/2021   HEMOGLOBIN A1C  05/10/2021   PNA vac Low Risk Adult  Completed   No flowsheet data found. Functional Status Survey:    Vitals:   01/30/21 1022  BP: (!) 145/85  Pulse: 71  Resp: 16  Temp: 97.9 F (36.6 C)  SpO2: 96%  Weight: 162 lb 6.4 oz (73.7 kg)  Height: '5\' 5"'$  (1.651 m)   Body mass index is 27.02 kg/m. Physical Exam Vitals and nursing note reviewed.  Constitutional:      Appearance: Normal appearance.  HENT:     Head: Normocephalic and atraumatic.     Nose: Nose normal.     Mouth/Throat:     Mouth: Mucous membranes are moist.  Eyes:     Extraocular Movements: Extraocular movements intact.     Conjunctiva/sclera: Conjunctivae normal.     Pupils: Pupils are equal, round, and reactive to light.  Cardiovascular:     Rate and Rhythm: Normal rate and regular rhythm.     Heart sounds: No murmur heard. Pulmonary:     Effort: Pulmonary effort is normal.     Breath sounds: No rales.  Abdominal:     General: Bowel sounds are normal.     Palpations: Abdomen is soft.     Tenderness: There is no abdominal tenderness.  Musculoskeletal:     Cervical back: Normal range of motion and neck supple.     Right lower leg: No edema.     Left lower leg: No edema.  Skin:    General: Skin is warm and dry.     Comments:    Neurological:     General: No focal deficit present.     Mental Status: He is alert.     Motor: No weakness.     Coordination: Coordination normal.     Gait: Gait abnormal.     Comments: Oriented to self.   Psychiatric:     Comments: Alert, followed simple directions    Labs reviewed: Recent Labs    07/20/20 0428  07/21/20 0359 08/02/20 1412 09/13/20 0000  NA 142 143 149* 137  K 2.9* 3.3* 4.0 4.4  CL 104 104 110* 102  CO2 '29 28 23 22  '$ GLUCOSE 155* 69* 246*  --   BUN '15 17 18 13  '$ CREATININE 0.98 1.03 1.14 1.1  CALCIUM 8.7* 8.7* 9.2 8.6*  MG 2.3  --   --   --    Recent Labs    05/01/20 1637 07/19/20 1450 07/20/20 0428 09/13/20 0000  AST 23 104* 24 19  ALT 25 37 26 15  ALKPHOS 76 77 80 96  BILITOT 0.9 1.5* 0.6  --   PROT 6.4* 6.3* 6.3*  --   ALBUMIN 3.2* 3.3* 3.2* 3.4*   Recent Labs    05/01/20 1637 07/19/20 1450 07/19/20 2209 09/13/20 0000  WBC 6.8 10.8* 8.3 18.2  NEUTROABS 5.4 8.7*  --  15,252.00  HGB 14.0 14.4 13.5 15.9  HCT 41.6 43.1 40.2 47  MCV 94.5 92.9 92.4  --   PLT 171 185 172 252   Lab Results  Component Value Date   TSH 4.697 (H) 07/19/2020   Lab Results  Component Value Date   HGBA1C 7.9 11/07/2020   No results found for: CHOL, HDL, LDLCALC, LDLDIRECT, TRIG, CHOLHDL  Significant Diagnostic Results in last 30 days:  No results found.  Assessment/Plan HTN (hypertension) Blood pressure is controlled, takes Amlodipine, Lisinopril Bun/creat 21/1.11 12/24/20  Hypothyroidism  takes Levothyroxine, TSH 4.53 12/24/20  Vascular dementia (HCC) off Donepezil, Hospice service, continue SNF [facility] for supportive care.   Type 2 diabetes mellitus with vascular disease ([place]) Tresiba 20u qd Novolog 5 u with lunch CBG>125, Hgb a1c 7.9 11/07/20, Hgb a1c 7.3 12/24/20, hs snack to avoid low CBG in am.   Gait abnormality Gait difficulty, the patient needs assistance for transfer, frequent falling due to attempting transferring self w/o calling for assistance.    Family/ staff Communication: plan of care reviewed with  the patient and charge nurse.   Labs/tests ordered:  none  Time spend 25 minutes

## 2021-01-30 NOTE — Assessment & Plan Note (Signed)
Gait difficulty, the patient needs assistance for transfer, frequent falling due to attempting transferring self w/o calling for assistance.

## 2021-02-20 ENCOUNTER — Non-Acute Institutional Stay (SKILLED_NURSING_FACILITY): Payer: Medicare Other | Admitting: Nurse Practitioner

## 2021-02-20 DIAGNOSIS — R269 Unspecified abnormalities of gait and mobility: Secondary | ICD-10-CM | POA: Diagnosis not present

## 2021-02-20 DIAGNOSIS — I10 Essential (primary) hypertension: Secondary | ICD-10-CM

## 2021-02-20 DIAGNOSIS — R102 Pelvic and perineal pain: Secondary | ICD-10-CM | POA: Diagnosis not present

## 2021-02-20 DIAGNOSIS — R55 Syncope and collapse: Secondary | ICD-10-CM

## 2021-02-20 DIAGNOSIS — F0150 Vascular dementia without behavioral disturbance: Secondary | ICD-10-CM

## 2021-02-20 DIAGNOSIS — E039 Hypothyroidism, unspecified: Secondary | ICD-10-CM

## 2021-02-20 DIAGNOSIS — E1159 Type 2 diabetes mellitus with other circulatory complications: Secondary | ICD-10-CM | POA: Diagnosis not present

## 2021-02-20 NOTE — Assessment & Plan Note (Signed)
takes Levothyroxine, TSH 4.53 12/24/20

## 2021-02-20 NOTE — Progress Notes (Signed)
Location:   SNF [place] Room Number: N027 Place of Service:  SNF (31) Provider: Lennie Odor Rorik Vespa NP  Kaliq Lege X, NP  Patient Care Team: Naia Ruff X, NP as PCP - General (Internal Medicine) [name], MD as PCP - Cardiology (Cardiology) [name], MD as Consulting Physician (Cardiology)  Extended Emergency Contact Information Primary Emergency Contact: Kolinski,[name] Address: [address]          [place], [place] [address] [name] of [place] Phone: [phone] Mobile Phone: [phone] Relation: Spouse  Code Status: DNR Goals of care: Advanced Directive information Advanced Directives 02/20/2021  Does Patient Have a Medical Advance Directive? Yes  Type of Advance Directive Out of facility DNR (pink MOST or yellow form)  Does patient want to make changes to medical advance directive? No - Patient declined  Copy of [place] in Chart? -  Would patient like information on creating a medical advance directive? -  Pre-existing out of facility DNR order (yellow form or pink MOST form) Yellow form placed in chart (order not valid for inpatient use);Pink MOST form placed in chart (order not valid for inpatient use)     Chief Complaint  Patient presents with   Acute Visit    Patient presents for blood sugar, family requests urinalysis and culture.      HPI:  Pt is a 80 y.o. male seen today for an acute visit for evaluate family request for UA C/S, wife reported the patient has been grbbing his groin areas and "shaking", grimaces if in pain.    Gait difficulty, the patient needs assistance for transfer, frequent falling due to attempting transferring self w/o calling for assistance.             T2DM Hgb a1c 7.3 12/24/20, hs snack to avoid low CBG in am. CBG, 50s, 60s, 70s frequently in am. On Tresiba 20 qd, Novology 4 u (120-200), 6u (201-300), 8u (301-400)             Syncope, stopped Labetalol, no recurrence reported              HTN, takes Amlodipine, Lisinopril Bun/creat 21/1.11 12/24/20             Hypothyroidism, takes Levothyroxine, TSH 4.53 12/24/20             Dementia, off Donepezil, Hospice service      Past Medical History:  Diagnosis Date   Aortic regurgitation    mild by echo 08/2019   Carotid artery stenosis    1-39% bilateral stenosis by dopplers 08/2019   Cataract    Complication of anesthesia    "high blood sugar; he's been loopy last 36h since OR"- 2016   Diabetes mellitus    DKA, type 2 ([place])    Hearing loss    Hypercholesteremia    Hypertension    Insulin pump in place    Macular degeneration    MCI (mild cognitive impairment) with memory loss 12/12/2014   OSA on CPAP 09/11/2014   PONV (postoperative nausea and vomiting)    Uses hearing aid    right ear, patient stated he lost left hearing aid    Past Surgical History:  Procedure Laterality Date   CHOLECYSTECTOMY N/A 10/28/2015   Procedure: LAPAROSCOPIC CHOLECYSTECTOMY WITH   INTRAOPERATIVE CHOLANGIOGRAM;  Surgeon: Greer Pickerel, MD;  Location: [facility];  Service: General;  Laterality: N/A;   SHOULDER ARTHROSCOPY Right 08/24/11   "frozen shoulder; RCR w/labrum  tear; arthritis scraping; bone spurs"   SHOULDER ARTHROSCOPY Left ~ 2006   TONSILLECTOMY  1949   "in childhood"    No Known Allergies  Allergies as of 02/20/2021   No Known Allergies      Medication List        Accurate as of February 20, 2021 11:59 PM. If you have any questions, ask your nurse or doctor.          amLODipine 10 MG tablet Commonly known as: NORVASC Take 10 mg by mouth every evening.   aspirin EC 81 MG tablet Take 81 mg by mouth daily.   insulin aspart 100 UNIT/ML injection Commonly known as: novoLOG Inject 4-8 Units into the skin 3 (three) times daily with meals. If blood sugar is 120-200, give 4 units If blood sugar is 201-300, give 6 units If blood sugar is 301-400, give 8 units If blood sugar is greater than 400, call MD   levothyroxine 25 MCG  tablet Commonly known as: SYNTHROID Take 25 mcg by mouth daily before breakfast.   lisinopril 30 MG tablet Commonly known as: ZESTRIL Take 30 mg by mouth daily.   nystatin-triamcinolone cream Commonly known as: MYCOLOG II Apply 1 application topically 2 (two) times daily.   [name] FlexTouch 100 UNIT/ML FlexTouch Pen Generic drug: insulin degludec Inject 20 Units into the skin in the morning. Per sliding scale      ROS was provided with assistance of family and staff.   Review of Systems  Constitutional:  Negative for activity change, fatigue and fever.  HENT:  Positive for hearing loss. Negative for congestion and voice change.   Eyes:  Negative for visual disturbance.  Respiratory:  Negative for cough.   Cardiovascular:  Negative for leg swelling.  Gastrointestinal:  Negative for abdominal pain and constipation.       ?suprapubic area discomfort.   Genitourinary:  Negative for dysuria, hematuria and urgency.  Musculoskeletal:  Positive for gait problem.  Skin:  Negative for color change.  Neurological:  Negative for syncope, weakness and light-headedness.       Dementia  Psychiatric/Behavioral:  Positive for confusion. Negative for sleep disturbance. The patient is not nervous/anxious.        Grimaces when touched.    Immunization History  Administered Date(s) Administered   DTaP, 5 pertussis antigens 09/07/2016   Influenza Split 05/09/2010, 03/02/2011, 03/21/2012, 04/06/2013, 03/26/2014, 04/03/2020   Influenza, High Dose Seasonal PF 04/06/2013, 04/02/2016, 03/31/2017   Influenza, Quadrivalent, Recombinant, Inj, Pf 03/14/2018, 03/06/2019, 05/15/2020   Influenza,inj,Quad PF,6+ Mos 05/01/2015   Moderna SARS-COV2 Booster Vaccination 04/30/2020, 11/19/2020   Moderna Sars-Covid-2 Vaccination 06/26/2019, 07/24/2019   Pneumococcal Conjugate-13 01/31/2014, 08/22/2015   Pneumococcal Polysaccharide-23 05/21/2008, 05/23/2009   Td 05/18/2007, 05/23/2009   Tdap 03/03/2012    Zoster, Live 05/21/2008, 05/23/2009   Pertinent  Health Maintenance Due  Topic Date Due   FOOT EXAM  Never done   OPHTHALMOLOGY EXAM  Never done   COLONOSCOPY (Pts 45-7yr Insurance coverage will need to be confirmed)  04/13/2019   INFLUENZA VACCINE  01/20/2021   HEMOGLOBIN A1C  05/10/2021   PNA vac Low Risk Adult  Completed   No flowsheet data found. Functional Status Survey:    Vitals:   02/20/21 1051  BP: (!) 132/58  Pulse: 68  Resp: 20  Temp: (!) 97.4 F (36.3 C)  SpO2: 96%  Weight: 156 lb 14.4 oz (71.2 kg)  Height: '5\' 5"'$  (1.651 m)   Body mass index is 26.11 kg/m. Physical  Exam Vitals and nursing note reviewed.  Constitutional:      Appearance: Normal appearance.  HENT:     Head: Normocephalic and atraumatic.     Nose: Nose normal.     Mouth/Throat:     Mouth: Mucous membranes are moist.  Eyes:     Extraocular Movements: Extraocular movements intact.     Conjunctiva/sclera: Conjunctivae normal.     Pupils: Pupils are equal, round, and reactive to light.  Cardiovascular:     Rate and Rhythm: Normal rate and regular rhythm.     Heart sounds: No murmur heard. Pulmonary:     Effort: Pulmonary effort is normal.     Breath sounds: No rales.  Abdominal:     General: Bowel sounds are normal.     Palpations: Abdomen is soft.     Comments: Grimaces before, during, after abd examination.   Musculoskeletal:     Cervical back: Normal range of motion and neck supple.     Right lower leg: No edema.     Left lower leg: No edema.  Skin:    General: Skin is warm and dry.     Comments: Dark skin discoloration R+L buttocks, no open areas, groin/penis/scrotum no redness, skin breakdown   Neurological:     General: No focal deficit present.     Mental Status: He is alert.     Motor: No weakness.     Coordination: Coordination normal.     Gait: Gait abnormal.     Comments: Oriented to self.   Psychiatric:     Comments: Alert, followed simple directions    Labs  reviewed: Recent Labs    07/20/20 0428 07/21/20 0359 08/02/20 1412 09/13/20 0000  NA 142 143 149* 137  K 2.9* 3.3* 4.0 4.4  CL 104 104 110* 102  CO2 '29 28 23 22  '$ GLUCOSE 155* 69* 246*  --   BUN '15 17 18 13  '$ CREATININE 0.98 1.03 1.14 1.1  CALCIUM 8.7* 8.7* 9.2 8.6*  MG 2.3  --   --   --    Recent Labs    05/01/20 1637 07/19/20 1450 07/20/20 0428 09/13/20 0000  AST 23 104* 24 19  ALT 25 37 26 15  ALKPHOS 76 77 80 96  BILITOT 0.9 1.5* 0.6  --   PROT 6.4* 6.3* 6.3*  --   ALBUMIN 3.2* 3.3* 3.2* 3.4*   Recent Labs    05/01/20 1637 07/19/20 1450 07/19/20 2209 09/13/20 0000  WBC 6.8 10.8* 8.3 18.2  NEUTROABS 5.4 8.7*  --  15,252.00  HGB 14.0 14.4 13.5 15.9  HCT 41.6 43.1 40.2 47  MCV 94.5 92.9 92.4  --   PLT 171 185 172 252   Lab Results  Component Value Date   TSH 4.697 (H) 07/19/2020   Lab Results  Component Value Date   HGBA1C 7.9 11/07/2020   No results found for: CHOL, HDL, LDLCALC, LDLDIRECT, TRIG, CHOLHDL  Significant Diagnostic Results in last 30 days:  No results found.  Assessment/Plan: Suprapubic discomfort family request for UA C/S, wife reported the patient has been grbbing his groin areas and "shaking", grimaces if in pain. Will update UA C/S. Observe s/s of UTI.   Type 2 diabetes mellitus with vascular disease (HCC) Hgb a1c 7.3 12/24/20, hs snack to avoid low CBG in am. CBG, 50s, 60s, 70s frequently in am. On Tresiba 20 qd, Novology 4 u (120-200), 6u (201-300), 8u (301-400). Decreased Tresiba to 18u.   Gait abnormality Gait difficulty,  the patient needs assistance for transfer, frequent falling due to attempting transferring self w/o calling for assistance.  Syncope with normal neurologic examination stopped Labetalol, no recurrence reported  HTN (hypertension) takes Amlodipine, Lisinopril Bun/creat 21/1.11 12/24/20  Hypothyroidism takes Levothyroxine, TSH 4.53 12/24/20  Vascular dementia ([place]) off Donepezil, Hospice service     Family/  staff Communication: plan of care reviewed with the patient and charge nurse.   Labs/tests ordered:  UA C/S  Time spend 25 minutes.

## 2021-02-20 NOTE — Assessment & Plan Note (Signed)
off Donepezil, Hospice service

## 2021-02-20 NOTE — Assessment & Plan Note (Signed)
takes Amlodipine, Lisinopril Bun/creat 21/1.11 12/24/20

## 2021-02-20 NOTE — Assessment & Plan Note (Signed)
Gait difficulty, the patient needs assistance for transfer, frequent falling due to attempting transferring self w/o calling for assistance.

## 2021-02-20 NOTE — Assessment & Plan Note (Signed)
Hgb a1c 7.3 12/24/20, hs snack to avoid low CBG in am. CBG, 50s, 60s, 70s frequently in am. On Tresiba 20 qd, Novology 4 u (120-200), 6u (201-300), 8u (301-400). Decreased Tresiba to 18u.

## 2021-02-20 NOTE — Assessment & Plan Note (Signed)
family request for UA C/S, wife reported the patient has been grbbing his groin areas and "shaking", grimaces if in pain. Will update UA C/S. Observe s/s of UTI.

## 2021-02-20 NOTE — Assessment & Plan Note (Signed)
stopped Labetalol, no recurrence reported 

## 2021-02-21 ENCOUNTER — Encounter: Payer: Self-pay | Admitting: Nurse Practitioner

## 2021-03-10 ENCOUNTER — Encounter: Payer: Self-pay | Admitting: Internal Medicine

## 2021-03-10 ENCOUNTER — Non-Acute Institutional Stay (SKILLED_NURSING_FACILITY): Payer: Medicare Other | Admitting: Internal Medicine

## 2021-03-10 DIAGNOSIS — E039 Hypothyroidism, unspecified: Secondary | ICD-10-CM

## 2021-03-10 DIAGNOSIS — I10 Essential (primary) hypertension: Secondary | ICD-10-CM

## 2021-03-10 DIAGNOSIS — F0150 Vascular dementia without behavioral disturbance: Secondary | ICD-10-CM

## 2021-03-10 DIAGNOSIS — E1159 Type 2 diabetes mellitus with other circulatory complications: Secondary | ICD-10-CM | POA: Diagnosis not present

## 2021-03-10 DIAGNOSIS — N3000 Acute cystitis without hematuria: Secondary | ICD-10-CM

## 2021-03-10 NOTE — Progress Notes (Signed)
Location:  [place] Room Number: 27 Place of Service:  SNF [phone])  Provider:   Code Status: DNR [place] Goals of Care:  Advanced Directives 02/20/2021  Does Patient Have a Medical Advance Directive? Yes  Type of Advance Directive Out of facility DNR (pink MOST or yellow form)  Does patient want to make changes to medical advance directive? No - Patient declined  Copy of [place] in Chart? -  Would patient like information on creating a medical advance directive? -  Pre-existing out of facility DNR order (yellow form or pink MOST form) Yellow form placed in chart (order not valid for inpatient use);Pink MOST form placed in chart (order not valid for inpatient use)     Chief Complaint  Patient presents with   Medical Management of Chronic Issues   Quality Metric Gaps    Foot exam, eye exam, Hep C screen, Shingrix, Colonoscopy, flu shot    HPI: Patient is a 80 y.o. male seen today for medical management of chronic diseases.    Patient has a history of diabetes type 2, hypertension, HLD, carotid stenosis history of OSA on CPAP Also has a history of unexplained syncope.s/p ILR Patient also has a history of dementia  Patient is enrolled in Hospice for his Dementia   Patient is alert but has aphasia Does not follows commands Not Ambulatory Recently diagnosed with UTI and is now on Bactrim. Urine was positive for ? Staph Epidermis ? Contamination  No Other Issue Has lost 10 lbs since my Last visit   Past Medical History:  Diagnosis Date   Aortic regurgitation    mild by echo 08/2019   Carotid artery stenosis    1-39% bilateral stenosis by dopplers 08/2019   Cataract    Complication of anesthesia    "high blood sugar; he's been loopy last 36h since OR"- 2016   Diabetes mellitus    DKA, type 2 ([place])    Hearing loss    Hypercholesteremia    Hypertension    Insulin pump in place    Macular degeneration    MCI (mild cognitive  impairment) with memory loss 12/12/2014   OSA on CPAP 09/11/2014   PONV (postoperative nausea and vomiting)    Uses hearing aid    right ear, patient stated he lost left hearing aid     Past Surgical History:  Procedure Laterality Date   CHOLECYSTECTOMY N/A 10/28/2015   Procedure: LAPAROSCOPIC CHOLECYSTECTOMY WITH   INTRAOPERATIVE CHOLANGIOGRAM;  Surgeon: Greer Pickerel, MD;  Location: [facility];  Service: General;  Laterality: N/A;   SHOULDER ARTHROSCOPY Right 08/24/11   "frozen shoulder; RCR w/labrum tear; arthritis scraping; bone spurs"   SHOULDER ARTHROSCOPY Left ~ 2006   TONSILLECTOMY  1949   "in childhood"    No Known Allergies  Outpatient Encounter Medications as of 03/10/2021  Medication Sig   amLODipine (NORVASC) 10 MG tablet Take 10 mg by mouth every evening.    aspirin EC 81 MG tablet Take 81 mg by mouth daily.   insulin aspart (NOVOLOG) 100 UNIT/ML injection Inject 4-8 Units into the skin 3 (three) times daily with meals. If blood sugar is 120-200, give 4 units If blood sugar is 201-300, give 6 units If blood sugar is 301-400, give 8 units If blood sugar is greater than 400, call MD   levothyroxine (SYNTHROID) 25 MCG tablet Take 25 mcg by mouth daily before breakfast.   lisinopril (ZESTRIL) 30 MG tablet Take 30 mg  by mouth daily.   nystatin-triamcinolone (MYCOLOG II) cream Apply 1 application topically 2 (two) times daily.   TRESIBA FLEXTOUCH 100 UNIT/ML FlexTouch Pen Inject 20 Units into the skin in the morning. Per sliding scale   No facility-administered encounter medications on file as of 03/10/2021.    Review of Systems:  Review of Systems  Unable to perform ROS: Dementia   Health Maintenance  Topic Date Due   FOOT EXAM  Never done   OPHTHALMOLOGY EXAM  Never done   Hepatitis C Screening  Never done   Zoster Vaccines- Shingrix (1 of 2) Never done   COLONOSCOPY (Pts 45-71yrs Insurance coverage will need to be confirmed)  04/13/2019   INFLUENZA VACCINE  01/20/2021    COVID-19 Vaccine (5 - Booster for Moderna series) 03/21/2021   HEMOGLOBIN A1C  05/10/2021   TETANUS/TDAP  03/03/2022   HPV VACCINES  Aged Out    Physical Exam: Vitals:   03/10/21 1129  BP: 124/85  Pulse: 76  Resp: 20  Temp: 97.9 F (36.6 C)  SpO2: 97%  Weight: 156 lb 14.4 oz (71.2 kg)  Height: 5\' 5"  (1.651 m)   Body mass index is 26.11 kg/m. Physical Exam Vitals reviewed.  Constitutional:      Appearance: Normal appearance.  HENT:     Head: Normocephalic.     Nose: Nose normal.     Mouth/Throat:     Mouth: Mucous membranes are moist.     Pharynx: Oropharynx is clear.  Eyes:     Pupils: Pupils are equal, round, and reactive to light.  Cardiovascular:     Rate and Rhythm: Normal rate and regular rhythm.     Heart sounds: Normal heart sounds.  Pulmonary:     Effort: Pulmonary effort is normal.     Breath sounds: Normal breath sounds.  Abdominal:     General: Abdomen is flat. Bowel sounds are normal.     Palpations: Abdomen is soft.  Musculoskeletal:        General: No swelling.     Cervical back: Neck supple.  Skin:    General: Skin is warm.  Neurological:     Mental Status: He is alert.     Comments: Does respond but then gets distracted and does not follow commands   Psychiatric:        Mood and Affect: Mood normal.        Thought Content: Thought content normal.    Labs reviewed: Basic Metabolic Panel: Recent Labs    07/19/20 2209 07/20/20 0428 07/21/20 0359 08/02/20 1412 09/13/20 0000  NA  --  142 143 149* 137  K  --  2.9* 3.3* 4.0 4.4  CL  --  104 104 110* 102  CO2  --  29 28 23 22   GLUCOSE  --  155* 69* 246*  --   BUN  --  15 17 18 13   CREATININE 1.02 0.98 1.03 1.14 1.1  CALCIUM  --  8.7* 8.7* 9.2 8.6*  MG  --  2.3  --   --   --   TSH 4.697*  --   --   --   --    Liver Function Tests: Recent Labs    05/01/20 1637 07/19/20 1450 07/20/20 0428 09/13/20 0000  AST 23 104* 24 19  ALT 25 37 26 15  ALKPHOS 76 77 80 96  BILITOT 0.9 1.5*  0.6  --   PROT 6.4* 6.3* 6.3*  --   ALBUMIN 3.2* 3.3*  3.2* 3.4*   No results for input(s): LIPASE, AMYLASE in the last 8760 hours. No results for input(s): AMMONIA in the last 8760 hours. CBC: Recent Labs    05/01/20 1637 07/19/20 1450 07/19/20 2209 09/13/20 0000  WBC 6.8 10.8* 8.3 18.2  NEUTROABS 5.4 8.7*  --  15,252.00  HGB 14.0 14.4 13.5 15.9  HCT 41.6 43.1 40.2 47  MCV 94.5 92.9 92.4  --   PLT 171 185 172 252   Lipid Panel: No results for input(s): CHOL, HDL, LDLCALC, TRIG, CHOLHDL, LDLDIRECT in the last 8760 hours. Lab Results  Component Value Date   HGBA1C 7.9 11/07/2020    Procedures since last visit: No results found.  Assessment/Plan Type 2 diabetes mellitus with vascular disease ([place]) A1C in 7/22 was 7.3 CBGs between 102-300 Covered with Loose Sliding scale Hypothyroidism, TSH 4.53 Not change the doe Acute cystitis without hematuria ON Bactrim Primary hypertension Continue Lisinopril and Norvasc Loose Control Vascular dementia without behavioral disturbance (HCC) On Aspirin No aggressive measures Supportive care Enrolled in hospice   Labs/tests ordered:  * No order type specified * Next appt:  Visit date not found

## 2021-03-11 LAB — BASIC METABOLIC PANEL
BUN: 14 (ref 4–21)
CO2: 27 — AB (ref 13–22)
Chloride: 107 (ref 99–108)
Creatinine: 1 (ref 0.6–1.3)
Glucose: 200
Potassium: 4.5 (ref 3.4–5.3)
Sodium: 140 (ref 137–147)

## 2021-03-11 LAB — COMPREHENSIVE METABOLIC PANEL: Calcium: 8.8 (ref 8.7–10.7)

## 2021-03-31 ENCOUNTER — Non-Acute Institutional Stay (SKILLED_NURSING_FACILITY): Payer: Medicare Other | Admitting: Nurse Practitioner

## 2021-03-31 DIAGNOSIS — R269 Unspecified abnormalities of gait and mobility: Secondary | ICD-10-CM | POA: Diagnosis not present

## 2021-03-31 DIAGNOSIS — E1159 Type 2 diabetes mellitus with other circulatory complications: Secondary | ICD-10-CM

## 2021-03-31 DIAGNOSIS — R55 Syncope and collapse: Secondary | ICD-10-CM

## 2021-03-31 DIAGNOSIS — F01C0 Vascular dementia, severe, without behavioral disturbance, psychotic disturbance, mood disturbance, and anxiety: Secondary | ICD-10-CM

## 2021-03-31 DIAGNOSIS — I10 Essential (primary) hypertension: Secondary | ICD-10-CM | POA: Diagnosis not present

## 2021-03-31 DIAGNOSIS — E039 Hypothyroidism, unspecified: Secondary | ICD-10-CM

## 2021-03-31 NOTE — Assessment & Plan Note (Signed)
stopped Labetalol, no recurrence reported 

## 2021-03-31 NOTE — Assessment & Plan Note (Signed)
Blood pressure is controlled, takes Amlodipine, Lisinopril Bun/creat 21/1.11 12/24/20

## 2021-03-31 NOTE — Assessment & Plan Note (Signed)
,   off Donepezil, Hospice service

## 2021-03-31 NOTE — Assessment & Plan Note (Signed)
takes Levothyroxine, TSH 4.53 12/24/20

## 2021-03-31 NOTE — Assessment & Plan Note (Signed)
Gait difficulty, the patient needs assistance for transfer, frequent falling due to attempting transferring self w/o calling for assistance.

## 2021-03-31 NOTE — Assessment & Plan Note (Signed)
T2DM Hgb a1c 7.3 12/24/20, hs snack to avoid low CBG in am. CBG am 102-200, noon 199-306, pm 184-332, On Tresiba,  Novology 4 u (120-200), 6u (201-300), 8u (301-400), loose blood sugar control.

## 2021-03-31 NOTE — Progress Notes (Signed)
Location:   SNF [place] Room Number: 67 Place of Service:   SNF [facility] Provider: [facility] Derrick Blaker NP  Derrick Merlin X, NP  Patient Care Team: [name], NP as PCP - General (Internal Medicine) Derrick Lance, MD as PCP - Cardiology (Cardiology) Derrick Margarita, MD as Consulting Physician (Cardiology)  Extended Emergency Contact Information Primary Emergency Contact: Derrick [name] Address: [address]          [place], [place] [address] Derrick [name] of [place] Phone: [phone] Mobile Phone: [phone] Relation: Spouse  Code Status:  DNR Goals of care: Advanced Directive information Advanced Directives 04/01/2021  Does Patient Have a Medical Advance Directive? Yes  Type of Advance Directive Out of facility DNR (pink MOST or yellow form)  Does patient want to make changes to medical advance directive? No - Patient declined  Copy of Derrick [name] in Chart? -  Would patient like information on creating a medical advance directive? -  Pre-existing out of facility DNR order (yellow form or pink MOST form) Pink MOST form placed in chart (order not valid for inpatient use);Yellow form placed in chart (order not valid for inpatient use)     Chief Complaint  Patient presents with   Medical Management of Chronic Issues    Routine follow up visit   Health Maintenance    Foot exam, eye exam, hep c screening, zoster,colonoscopy, flu vaccine, COVID booster     HPI:  Pt is a 80 y.o. male seen today for medical management of chronic diseases.     Gait difficulty, the patient needs assistance for transfer, frequent falling due to attempting transferring self w/o calling for assistance.             T2DM Hgb a1c 7.3 12/24/20, hs snack to avoid low CBG in am. CBG am 102-200, noon 199-306, pm 184-332, On Tresiba,  Novology 4 u (120-200), 6u (201-300), 8u (301-400)             Syncope, stopped Labetalol, no recurrence reported             HTN, takes Amlodipine,  Lisinopril Bun/creat 21/1.11 12/24/20             Hypothyroidism, takes Levothyroxine, TSH 4.53 12/24/20             Dementia, off Donepezil, Hospice service   Past Medical History:  Diagnosis Date   Aortic regurgitation    mild by echo 08/2019   Carotid artery stenosis    1-39% bilateral stenosis by dopplers 08/2019   Cataract    Complication of anesthesia    "high blood sugar; he's been loopy last 36h since OR"- 2016   Diabetes mellitus    DKA, type 2 (Derrick [name])    Hearing loss    Hypercholesteremia    Hypertension    Insulin pump in place    Macular degeneration    MCI (mild cognitive impairment) with memory loss 12/12/2014   OSA on CPAP 09/11/2014   PONV (postoperative nausea and vomiting)    Uses hearing aid    right ear, patient stated he lost left hearing aid    Past Surgical History:  Procedure Laterality Date   CHOLECYSTECTOMY N/A 10/28/2015   Procedure: LAPAROSCOPIC CHOLECYSTECTOMY WITH   INTRAOPERATIVE CHOLANGIOGRAM;  Surgeon: Greer Pickerel, MD;  Location: [facility];  Service: General;  Laterality: N/A;   SHOULDER ARTHROSCOPY Right 08/24/11   "frozen shoulder; RCR w/labrum tear; arthritis scraping; bone spurs"  SHOULDER ARTHROSCOPY Left ~ 2006   TONSILLECTOMY  1949   "in childhood"    No Known Allergies  Allergies as of 03/31/2021   No Known Allergies      Medication List        Accurate as of March 31, 2021 11:59 PM. If you have any questions, ask your nurse or doctor.          amLODipine 10 MG tablet Commonly known as: NORVASC Take 10 mg by mouth every evening.   aspirin EC 81 MG tablet Take 81 mg by mouth daily.   insulin aspart 100 UNIT/ML injection Commonly known as: novoLOG Inject 4-8 Units into the skin 3 (three) times daily with meals. If blood sugar is 120-200, give 4 units If blood sugar is 201-300, give 6 units If blood sugar is 301-400, give 8 units If blood sugar is greater than 400, call MD   levothyroxine 25 MCG tablet Commonly known as:  SYNTHROID Take 25 mcg by mouth daily before breakfast.   lisinopril 30 MG tablet Commonly known as: ZESTRIL Take 30 mg by mouth daily.   nystatin-triamcinolone cream Commonly known as: MYCOLOG II Apply 1 application topically 2 (two) times daily.   Derrick [name] FlexTouch 100 UNIT/ML FlexTouch Pen Generic drug: insulin degludec Inject 18 Units into the skin in the morning. Per sliding scale        Review of Systems  Unable to perform ROS: Dementia   Immunization History  Administered Date(s) Administered   DTaP, 5 pertussis antigens 09/07/2016   Influenza Split 05/09/2010, 03/02/2011, 03/21/2012, 04/06/2013, 03/26/2014, 04/03/2020   Influenza, High Dose Seasonal PF 04/06/2013, 04/02/2016, 03/31/2017   Influenza, Quadrivalent, Recombinant, Inj, Pf 03/14/2018, 03/06/2019, 05/15/2020   Influenza,inj,Quad PF,6+ Mos 05/01/2015   Moderna SARS-COV2 Booster Vaccination 04/30/2020, 11/19/2020   Moderna Sars-Covid-2 Vaccination 06/26/2019, 07/24/2019   Pneumococcal Conjugate-13 01/31/2014, 08/22/2015   Pneumococcal Polysaccharide-23 05/21/2008, 05/23/2009   Td 05/18/2007, 05/23/2009   Tdap 03/03/2012   Zoster, Live 05/21/2008, 05/23/2009   Pertinent  Health Maintenance Due  Topic Date Due   FOOT EXAM  Never done   OPHTHALMOLOGY EXAM  Never done   COLONOSCOPY (Pts 45-26yrs Insurance coverage will need to be confirmed)  04/13/2019   INFLUENZA VACCINE  01/20/2021   HEMOGLOBIN A1C  05/10/2021   No flowsheet data found. Functional Status Survey:    Vitals:   03/31/21 1146  BP: 132/76  Pulse: 68  Resp: 16  Temp: (!) 97.1 F (36.2 C)  SpO2: 99%  Weight: 161 lb 3.2 oz (73.1 kg)  Height: 5\' 5"  (1.651 m)   Body mass index is 26.83 kg/m. Physical Exam Vitals and nursing note reviewed.  Constitutional:      Appearance: Normal appearance.  HENT:     Head: Normocephalic and atraumatic.     Mouth/Throat:     Mouth: Mucous membranes are moist.  Eyes:     Extraocular Movements:  Extraocular movements intact.     Conjunctiva/sclera: Conjunctivae normal.     Pupils: Pupils are equal, round, and reactive to light.  Cardiovascular:     Rate and Rhythm: Normal rate and regular rhythm.     Heart sounds: No murmur heard. Pulmonary:     Effort: Pulmonary effort is normal.     Breath sounds: No rales.  Abdominal:     General: Bowel sounds are normal.     Palpations: Abdomen is soft.  Musculoskeletal:     Cervical back: Normal range of motion and neck supple.  Right lower leg: No edema.     Left lower leg: No edema.  Skin:    General: Skin is warm and dry.     Comments:    Neurological:     General: No focal deficit present.     Mental Status: He is alert.     Motor: No weakness.     Coordination: Coordination normal.     Gait: Gait abnormal.     Comments: Oriented to self.   Psychiatric:     Comments: Alert, followed simple directions    Labs reviewed: Recent Labs    07/20/20 0428 07/21/20 0359 08/02/20 1412 09/13/20 0000  NA 142 143 149* 137  K 2.9* 3.3* 4.0 4.4  CL 104 104 110* 102  CO2 29 28 23 22   GLUCOSE 155* 69* 246*  --   BUN 15 17 18 13   CREATININE 0.98 1.03 1.14 1.1  CALCIUM 8.7* 8.7* 9.2 8.6*  MG 2.3  --   --   --    Recent Labs    05/01/20 1637 07/19/20 1450 07/20/20 0428 09/13/20 0000  AST 23 104* 24 19  ALT 25 37 26 15  ALKPHOS 76 77 80 96  BILITOT 0.9 1.5* 0.6  --   PROT 6.4* 6.3* 6.3*  --   ALBUMIN 3.2* 3.3* 3.2* 3.4*   Recent Labs    05/01/20 1637 07/19/20 1450 07/19/20 2209 09/13/20 0000  WBC 6.8 10.8* 8.3 18.2  NEUTROABS 5.4 8.7*  --  15,252.00  HGB 14.0 14.4 13.5 15.9  HCT 41.6 43.1 40.2 47  MCV 94.5 92.9 92.4  --   PLT 171 185 172 252   Lab Results  Component Value Date   TSH 4.697 (H) 07/19/2020   Lab Results  Component Value Date   HGBA1C 7.9 11/07/2020   No results found for: CHOL, HDL, LDLCALC, LDLDIRECT, TRIG, CHOLHDL  Significant Diagnostic Results in last 30 days:  No results  found.  Assessment/Plan  Type 2 diabetes mellitus with vascular disease (HCC) T2DM Hgb a1c 7.3 12/24/20, hs snack to avoid low CBG in am. CBG am 102-200, noon 199-306, pm 184-332, On Tresiba,  Novology 4 u (120-200), 6u (201-300), 8u (301-400), loose blood sugar control.   Gait abnormality Gait difficulty, the patient needs assistance for transfer, frequent falling due to attempting transferring self w/o calling for assistance.  Syncope and collapse stopped Labetalol, no recurrence reported  HTN (hypertension) Blood pressure is controlled, takes Amlodipine, Lisinopril Bun/creat 21/1.11 12/24/20  Hypothyroidism takes Levothyroxine, TSH 4.53 12/24/20  Vascular dementia (HCC) , off Donepezil, Hospice service    Family/ staff Communication: plan of care reviewed with the patient and charge nurse.   Labs/tests ordered: none  Time spend 25 minutes.

## 2021-04-01 ENCOUNTER — Encounter: Payer: Self-pay | Admitting: Nurse Practitioner

## 2021-04-03 ENCOUNTER — Encounter: Payer: Self-pay | Admitting: Nurse Practitioner

## 2021-04-08 ENCOUNTER — Encounter: Payer: Self-pay | Admitting: Internal Medicine

## 2021-04-08 DIAGNOSIS — G4733 Obstructive sleep apnea (adult) (pediatric): Secondary | ICD-10-CM | POA: Diagnosis not present

## 2021-04-08 NOTE — Progress Notes (Signed)
Location:   [place] Room Number: 27 Place of Service:  SNF (31) Provider:  Veleta Miners MD  Mast, Man X, NP  Patient Care Team: Mast, Man X, NP as PCP - General (Internal Medicine) [name], MD as PCP - Cardiology (Cardiology) [name], MD as Consulting Physician (Cardiology)  Extended Emergency Contact Information Primary Emergency Contact: Grade,[name] Address: [address]          [place], [place] [address] [name] of [employer] Phone: [phone] Mobile Phone: [phone] Relation: Spouse  Code Status:  DNR Hospice Managed Car Goals of care: Advanced Directive information Advanced Directives 04/08/2021  Does Patient Have a Medical Advance Directive? Yes  Type of Advance Directive Out of facility DNR (pink MOST or yellow form)  Does patient want to make changes to medical advance directive? No - Patient declined  Copy of [place] in Chart? -  Would patient like information on creating a medical advance directive? -  Pre-existing out of facility DNR order (yellow form or pink MOST form) Pink MOST form placed in chart (order not valid for inpatient use);Yellow form placed in chart (order not valid for inpatient use)     Chief Complaint  Patient presents with   Acute Visit    Rash    HPI:  Pt is a 80 y.o. male seen today for an acute visit for    Past Medical History:  Diagnosis Date   Aortic regurgitation    mild by echo 08/2019   Carotid artery stenosis    1-39% bilateral stenosis by dopplers 08/2019   Cataract    Complication of anesthesia    "high blood sugar; he's been loopy last 36h since OR"- 2016   Diabetes mellitus    DKA, type 2 ([place])    Hearing loss    Hypercholesteremia    Hypertension    Insulin pump in place    Macular degeneration    MCI (mild cognitive impairment) with memory loss 12/12/2014   OSA on CPAP 09/11/2014   PONV (postoperative nausea and vomiting)     Uses hearing aid    right ear, patient stated he lost left hearing aid    Past Surgical History:  Procedure Laterality Date   CHOLECYSTECTOMY N/A 10/28/2015   Procedure: LAPAROSCOPIC CHOLECYSTECTOMY WITH   INTRAOPERATIVE CHOLANGIOGRAM;  Surgeon: Greer Pickerel, MD;  Location: [facility];  Service: General;  Laterality: N/A;   SHOULDER ARTHROSCOPY Right 08/24/11   "frozen shoulder; RCR w/labrum tear; arthritis scraping; bone spurs"   SHOULDER ARTHROSCOPY Left ~ 2006   TONSILLECTOMY  1949   "in childhood"    No Known Allergies  Allergies as of 04/08/2021   No Known Allergies      Medication List        Accurate as of April 08, 2021 10:03 AM. If you have any questions, ask your nurse or doctor.          amLODipine 10 MG tablet Commonly known as: NORVASC Take 10 mg by mouth every evening.   aspirin EC 81 MG tablet Take 81 mg by mouth daily.   insulin aspart 100 UNIT/ML injection Commonly known as: novoLOG Inject 4-8 Units into the skin 3 (three) times daily with meals. If blood sugar is 120-200, give 4 units If blood sugar is 201-300, give 6 units If blood sugar is 301-400, give 8 units If blood sugar is greater than 400, call MD   levothyroxine 25 MCG  tablet Commonly known as: SYNTHROID Take 25 mcg by mouth daily before breakfast.   lisinopril 30 MG tablet Commonly known as: ZESTRIL Take 30 mg by mouth daily.   nystatin-triamcinolone cream Commonly known as: MYCOLOG II Apply 1 application topically 2 (two) times daily.   [name] FlexTouch 100 UNIT/ML FlexTouch Pen Generic drug: insulin degludec Inject 18 Units into the skin in the morning. Per sliding scale        Review of Systems  Immunization History  Administered Date(s) Administered   DTaP, 5 pertussis antigens 09/07/2016   Influenza Split 05/09/2010, 03/02/2011, 03/21/2012, 04/06/2013, 03/26/2014, 04/03/2020   Influenza, High Dose Seasonal PF 04/06/2013, 04/02/2016, 03/31/2017   Influenza,  Quadrivalent, Recombinant, Inj, Pf 03/14/2018, 03/06/2019, 05/15/2020   Influenza,inj,Quad PF,6+ Mos 05/01/2015   Moderna SARS-COV2 Booster Vaccination 04/30/2020, 11/19/2020   Moderna Sars-Covid-2 Vaccination 06/26/2019, 07/24/2019   Pneumococcal Conjugate-13 01/31/2014, 08/22/2015   Pneumococcal Polysaccharide-23 05/21/2008, 05/23/2009   Td 05/18/2007, 05/23/2009   Tdap 03/03/2012   Zoster, Live 05/21/2008, 05/23/2009   Pertinent  Health Maintenance Due  Topic Date Due   FOOT EXAM  Never done   OPHTHALMOLOGY EXAM  Never done   COLONOSCOPY (Pts 45-82yrs Insurance coverage will need to be confirmed)  04/13/2019   INFLUENZA VACCINE  01/20/2021   HEMOGLOBIN A1C  06/27/2021   No flowsheet data found. Functional Status Survey:    Vitals:   04/08/21 0952  BP: 127/70  Pulse: 64  Resp: 16  Temp: 98.4 F (36.9 C)  SpO2: 96%  Weight: 161 lb 3.2 oz (73.1 kg)  Height: 5\' 5"  (1.651 m)   Body mass index is 26.83 kg/m. Physical Exam  Labs reviewed: Recent Labs    07/20/20 0428 07/21/20 0359 07/21/20 0359 08/02/20 1412 09/13/20 0000 12/25/20 0000 03/11/21 0000  NA 142 143   < > 149* 137 144 140  K 2.9* 3.3*  --  4.0 4.4 3.6 4.5  CL 104 104  --  110* 102 110* 107  CO2 29 28  --  23 22 27* 27*  GLUCOSE 155* 69*  --  246*  --   --   --   BUN 15 17   < > 18 13 21 14   CREATININE 0.98 1.03  --  1.14 1.1 1.1 1.0  CALCIUM 8.7* 8.7*  --  9.2 8.6*  --  8.8  MG 2.3  --   --   --   --   --   --    < > = values in this interval not displayed.   Recent Labs    05/01/20 1637 07/19/20 1450 07/20/20 0428 09/13/20 0000  AST 23 104* 24 19  ALT 25 37 26 15  ALKPHOS 76 77 80 96  BILITOT 0.9 1.5* 0.6  --   PROT 6.4* 6.3* 6.3*  --   ALBUMIN 3.2* 3.3* 3.2* 3.4*   Recent Labs    05/01/20 1637 07/19/20 1450 07/19/20 2209 09/13/20 0000 12/25/20 0000  WBC 6.8 10.8* 8.3 18.2 4.9  NEUTROABS 5.4 8.7*  --  15,252.00  --   HGB 14.0 14.4 13.5 15.9 13.3*  HCT 41.6 43.1 40.2 47 40*   MCV 94.5 92.9 92.4  --   --   PLT 171 185 172 252 175   Lab Results  Component Value Date   TSH 4.53 12/25/2020   Lab Results  Component Value Date   HGBA1C 7.3 12/25/2020   No results found for: CHOL, HDL, LDLCALC, LDLDIRECT, TRIG, CHOLHDL  Significant Diagnostic Results  in last 30 days:  No results found.  Assessment/Plan There are no diagnoses linked to this encounter.   Family/ staff Communication:   Labs/tests ordered:

## 2021-04-14 ENCOUNTER — Non-Acute Institutional Stay (SKILLED_NURSING_FACILITY): Payer: Medicare Other | Admitting: Nurse Practitioner

## 2021-04-14 ENCOUNTER — Encounter: Payer: Self-pay | Admitting: Nurse Practitioner

## 2021-04-14 DIAGNOSIS — I10 Essential (primary) hypertension: Secondary | ICD-10-CM

## 2021-04-14 DIAGNOSIS — E039 Hypothyroidism, unspecified: Secondary | ICD-10-CM

## 2021-04-14 DIAGNOSIS — E1159 Type 2 diabetes mellitus with other circulatory complications: Secondary | ICD-10-CM

## 2021-04-14 DIAGNOSIS — R269 Unspecified abnormalities of gait and mobility: Secondary | ICD-10-CM | POA: Diagnosis not present

## 2021-04-14 DIAGNOSIS — R21 Rash and other nonspecific skin eruption: Secondary | ICD-10-CM | POA: Diagnosis not present

## 2021-04-14 DIAGNOSIS — F01C0 Vascular dementia, severe, without behavioral disturbance, psychotic disturbance, mood disturbance, and anxiety: Secondary | ICD-10-CM

## 2021-04-14 DIAGNOSIS — R55 Syncope and collapse: Secondary | ICD-10-CM

## 2021-04-14 DIAGNOSIS — I781 Nevus, non-neoplastic: Secondary | ICD-10-CM | POA: Diagnosis not present

## 2021-04-14 NOTE — Progress Notes (Signed)
Location:   SNF [facility] Room Number: 27-A Place of Service:  SNF (31) Provider: Lennie Odor Libbey Duce NP  Darick Fetters X, NP  Patient Care Team: Natarsha Hurwitz X, NP as PCP - General (Internal Medicine) [name], MD as PCP - Cardiology (Cardiology) [name], MD as Consulting Physician (Cardiology)  Extended Emergency Contact Information Primary Emergency Contact: Grima,[name] Address: [address]          [place], [place] [address] [place] Phone: [phone] Mobile Phone: [phone] Relation: Spouse  Code Status: DNR Goals of care: Advanced Directive information Advanced Directives 04/14/2021  Does Patient Have a Medical Advance Directive? Yes  Type of Advance Directive Out of facility DNR (pink MOST or yellow form)  Does patient want to make changes to medical advance directive? No - Patient declined  Copy of [place] in Chart? -  Would patient like information on creating a medical advance directive? -  Pre-existing out of facility DNR order (yellow form or pink MOST form) -     Chief Complaint  Patient presents with   Acute Visit    Petchige head, abd, rlf.    HPI:  Pt is a 80 y.o. male seen today for an acute visit for reported petechiae to various parts of body(abd, RLE, head). The patient was found to have scattered small red bumps made up of small blood vessels/capillaries abd, chest, back, and legs R>L. No itching, bleeding, or pain noted.   Gait difficulty, the patient needs assistance for transfer, frequent falling due to attempting transferring self w/o calling for assistance.             T2DM Hgb a1c 7.3 12/24/20, hs snack to avoid low CBG in am. On Tresiba,  Novology 4 u (120-200), 6u (201-300), 8u (301-400)             Syncope, stopped Labetalol, no recurrence reported             HTN, takes Amlodipine, Lisinopril Bun/creat 14/1.0 03/11/21             Hypothyroidism, takes Levothyroxine, TSH 4.53  12/24/20             Dementia, off Donepezil, Hospice service    Past Medical History:  Diagnosis Date   Aortic regurgitation    mild by echo 08/2019   Carotid artery stenosis    1-39% bilateral stenosis by dopplers 08/2019   Cataract    Complication of anesthesia    "high blood sugar; he's been loopy last 36h since OR"- 2016   Diabetes mellitus    DKA, type 2 ([place])    Hearing loss    Hypercholesteremia    Hypertension    Insulin pump in place    Macular degeneration    MCI (mild cognitive impairment) with memory loss 12/12/2014   OSA on CPAP 09/11/2014   PONV (postoperative nausea and vomiting)    Uses hearing aid    right ear, patient stated he lost left hearing aid    Past Surgical History:  Procedure Laterality Date   CHOLECYSTECTOMY N/A 10/28/2015   Procedure: LAPAROSCOPIC CHOLECYSTECTOMY WITH   INTRAOPERATIVE CHOLANGIOGRAM;  Surgeon: Greer Pickerel, MD;  Location: [facility];  Service: General;  Laterality: N/A;   SHOULDER ARTHROSCOPY Right 08/24/11   "frozen shoulder; RCR w/labrum tear; arthritis scraping; bone spurs"   SHOULDER ARTHROSCOPY Left ~ 2006   TONSILLECTOMY  1949   "in childhood"    No  Known Allergies  Allergies as of 04/14/2021   No Known Allergies      Medication List        Accurate as of April 14, 2021 11:59 PM. If you have any questions, ask your nurse or doctor.          amLODipine 10 MG tablet Commonly known as: NORVASC Take 10 mg by mouth every evening.   aspirin EC 81 MG tablet Take 81 mg by mouth daily.   insulin aspart 100 UNIT/ML injection Commonly known as: novoLOG Inject 4-8 Units into the skin 3 (three) times daily with meals. If blood sugar is 120-200, give 4 units If blood sugar is 201-300, give 6 units If blood sugar is 301-400, give 8 units If blood sugar is greater than 400, call MD   levothyroxine 25 MCG tablet Commonly known as: SYNTHROID Take 25 mcg by mouth daily before breakfast.   lisinopril 30 MG tablet Commonly  known as: ZESTRIL Take 30 mg by mouth daily.   nystatin-triamcinolone cream Commonly known as: MYCOLOG II Apply 1 application topically 2 (two) times daily.   [name] FlexTouch 100 UNIT/ML FlexTouch Pen Generic drug: insulin degludec Inject 18 Units into the skin in the morning. Per sliding scale        Review of Systems  Unable to perform ROS: Dementia   Immunization History  Administered Date(s) Administered   DTaP, 5 pertussis antigens 09/07/2016   Influenza Split 05/09/2010, 03/02/2011, 03/21/2012, 04/06/2013, 03/26/2014, 04/03/2020   Influenza, High Dose Seasonal PF 04/06/2013, 04/02/2016, 03/31/2017   Influenza, Quadrivalent, Recombinant, Inj, Pf 03/14/2018, 03/06/2019, 05/15/2020   Influenza,inj,Quad PF,6+ Mos 05/01/2015   Moderna SARS-COV2 Booster Vaccination 04/30/2020, 11/19/2020   Moderna Sars-Covid-2 Vaccination 06/26/2019, 07/24/2019   Pneumococcal Conjugate-13 01/31/2014, 08/22/2015   Pneumococcal Polysaccharide-23 05/21/2008, 05/23/2009   Td 05/18/2007, 05/23/2009   Tdap 03/03/2012   Zoster, Live 05/21/2008, 05/23/2009   Pertinent  Health Maintenance Due  Topic Date Due   FOOT EXAM  Never done   OPHTHALMOLOGY EXAM  Never done   COLONOSCOPY (Pts 45-42yrs Insurance coverage will need to be confirmed)  04/13/2019   INFLUENZA VACCINE  01/20/2021   HEMOGLOBIN A1C  06/27/2021   No flowsheet data found. Functional Status Survey:    Vitals:   04/14/21 1502  BP: (!) 149/83  Pulse: 69  Resp: 17  Temp: 98 F (36.7 C)  SpO2: 95%  Weight: 161 lb 3.2 oz (73.1 kg)  Height: 5\' 5"  (1.651 m)   Body mass index is 26.83 kg/m. Physical Exam Vitals and nursing note reviewed.  Constitutional:      Appearance: Normal appearance.  HENT:     Head: Normocephalic and atraumatic.     Mouth/Throat:     Mouth: Mucous membranes are moist.  Eyes:     Extraocular Movements: Extraocular movements intact.     Conjunctiva/sclera: Conjunctivae normal.     Pupils: Pupils  are equal, round, and reactive to light.  Cardiovascular:     Rate and Rhythm: Normal rate and regular rhythm.     Heart sounds: No murmur heard. Pulmonary:     Effort: Pulmonary effort is normal.     Breath sounds: No rales.  Abdominal:     General: Bowel sounds are normal.     Palpations: Abdomen is soft.  Musculoskeletal:     Cervical back: Normal range of motion and neck supple.     Right lower leg: No edema.     Left lower leg: No edema.  Skin:  General: Skin is warm and dry.     Comments: Dark red discoloration of R+L buttocks and sacrum. Scattered cherry angioma chest, abd, back, BLE R>L.    Neurological:     General: No focal deficit present.     Mental Status: He is alert.     Motor: No weakness.     Coordination: Coordination normal.     Gait: Gait abnormal.     Comments: Oriented to self.   Psychiatric:     Comments: Alert, followed simple directions    Labs reviewed: Recent Labs    07/20/20 0428 07/21/20 0359 07/21/20 0359 08/02/20 1412 09/13/20 0000 12/25/20 0000 03/11/21 0000  NA 142 143   < > 149* 137 144 140  K 2.9* 3.3*  --  4.0 4.4 3.6 4.5  CL 104 104  --  110* 102 110* 107  CO2 29 28  --  23 22 27* 27*  GLUCOSE 155* 69*  --  246*  --   --   --   BUN 15 17   < > 18 13 21 14   CREATININE 0.98 1.03  --  1.14 1.1 1.1 1.0  CALCIUM 8.7* 8.7*  --  9.2 8.6*  --  8.8  MG 2.3  --   --   --   --   --   --    < > = values in this interval not displayed.   Recent Labs    05/01/20 1637 07/19/20 1450 07/20/20 0428 09/13/20 0000  AST 23 104* 24 19  ALT 25 37 26 15  ALKPHOS 76 77 80 96  BILITOT 0.9 1.5* 0.6  --   PROT 6.4* 6.3* 6.3*  --   ALBUMIN 3.2* 3.3* 3.2* 3.4*   Recent Labs    05/01/20 1637 07/19/20 1450 07/19/20 2209 09/13/20 0000 12/25/20 0000  WBC 6.8 10.8* 8.3 18.2 4.9  NEUTROABS 5.4 8.7*  --  15,252.00  --   HGB 14.0 14.4 13.5 15.9 13.3*  HCT 41.6 43.1 40.2 47 40*  MCV 94.5 92.9 92.4  --   --   PLT 171 185 172 252 175   Lab  Results  Component Value Date   TSH 4.53 12/25/2020   Lab Results  Component Value Date   HGBA1C 7.3 12/25/2020   No results found for: CHOL, HDL, LDLCALC, LDLDIRECT, TRIG, CHOLHDL  Significant Diagnostic Results in last 30 days:  No results found.  Assessment/Plan: Senile angioma The patient was found to have scattered small red bumps made up of small blood vessels/capillaries abd, chest, back, and legs R>L. No itching, bleeding, or pain noted. Observe.   Excoriated rash Chronic buttock/sacrum, continue Nystatin, keep the area dry and clean   Gait abnormality Gait difficulty, the patient needs assistance for transfer, frequent falling due to attempting transferring self w/o calling for assistance.  Type 2 diabetes mellitus with vascular disease (HCC) Hgb a1c 7.3 12/24/20, hs snack to avoid low CBG in am. On Tresiba,  Novology 4 u (120-200), 6u (201-300), 8u (301-400)  Syncope with normal neurologic examination stopped Labetalol, no recurrence reported  HTN (hypertension) Blood pressure is controlled, takes Amlodipine, Lisinopril Bun/creat 14/1.0 03/11/21  Hypothyroidism takes Levothyroxine, TSH 4.53 12/24/20  Vascular dementia ([place]) off Donepezil, Hospice service, total dependent of ADLs.     Family/ staff Communication: plan of care reviewed with the patient and charge nurse.   Labs/tests ordered:  none  Time spend 25 minutes.

## 2021-04-14 NOTE — Assessment & Plan Note (Signed)
Hgb a1c 7.3 12/24/20, hs snack to avoid low CBG in am. On Tresiba,  Novology 4 u (120-200), 6u (201-300), 8u (301-400)

## 2021-04-14 NOTE — Assessment & Plan Note (Signed)
Gait difficulty, the patient needs assistance for transfer, frequent falling due to attempting transferring self w/o calling for assistance.

## 2021-04-14 NOTE — Progress Notes (Signed)
Location:   [facility] Room Number: 27-A Place of Service:  SNF (31) Provider:  Mast, Man, NP    Patient Care Team: Mast, Man X, NP as PCP - General (Internal Medicine) [name], MD as PCP - Cardiology (Cardiology) [name], MD as Consulting Physician (Cardiology)  Extended Emergency Contact Information Primary Emergency Contact: Tomlin,[name] Address: [address]          [place], [place] [address] [name] Phone: [phone] Mobile Phone: [phone] Relation: Spouse  Code Status:  DNR Goals of care: Advanced Directive information Advanced Directives 04/14/2021  Does Patient Have a Medical Advance Directive? Yes  Type of Advance Directive Out of facility DNR (pink MOST or yellow form)  Does patient want to make changes to medical advance directive? No - Patient declined  Copy of [place] in Chart? -  Would patient like information on creating a medical advance directive? -  Pre-existing out of facility DNR order (yellow form or pink MOST form) -     Chief Complaint  Patient presents with   Acute Visit    Petchige head, abd, rlf.    HPI:  Pt is a 80 y.o. male seen today for an acute visit for    Past Medical History:  Diagnosis Date   Aortic regurgitation    mild by echo 08/2019   Carotid artery stenosis    1-39% bilateral stenosis by dopplers 08/2019   Cataract    Complication of anesthesia    "high blood sugar; he's been loopy last 36h since OR"- 2016   Diabetes mellitus    DKA, type 2 ([place])    Hearing loss    Hypercholesteremia    Hypertension    Insulin pump in place    Macular degeneration    MCI (mild cognitive impairment) with memory loss 12/12/2014   OSA on CPAP 09/11/2014   PONV (postoperative nausea and vomiting)    Uses hearing aid    right ear, patient stated he lost left hearing aid    Past Surgical History:  Procedure Laterality Date   CHOLECYSTECTOMY  N/A 10/28/2015   Procedure: LAPAROSCOPIC CHOLECYSTECTOMY WITH   INTRAOPERATIVE CHOLANGIOGRAM;  Surgeon: Greer Pickerel, MD;  Location: [facility];  Service: General;  Laterality: N/A;   SHOULDER ARTHROSCOPY Right 08/24/11   "frozen shoulder; RCR w/labrum tear; arthritis scraping; bone spurs"   SHOULDER ARTHROSCOPY Left ~ 2006   TONSILLECTOMY  1949   "in childhood"    No Known Allergies  Allergies as of 04/14/2021   No Known Allergies      Medication List        Accurate as of April 14, 2021  3:05 PM. If you have any questions, ask your nurse or doctor.          amLODipine 10 MG tablet Commonly known as: NORVASC Take 10 mg by mouth every evening.   aspirin EC 81 MG tablet Take 81 mg by mouth daily.   insulin aspart 100 UNIT/ML injection Commonly known as: novoLOG Inject 4-8 Units into the skin 3 (three) times daily with meals. If blood sugar is 120-200, give 4 units If blood sugar is 201-300, give 6 units If blood sugar is 301-400, give 8 units If blood sugar is greater than 400, call MD   levothyroxine 25 MCG tablet Commonly known as: SYNTHROID Take 25 mcg by mouth daily before breakfast.   lisinopril 30 MG tablet Commonly known as:  ZESTRIL Take 30 mg by mouth daily.   nystatin-triamcinolone cream Commonly known as: MYCOLOG II Apply 1 application topically 2 (two) times daily.   [name] FlexTouch 100 UNIT/ML FlexTouch Pen Generic drug: insulin degludec Inject 18 Units into the skin in the morning. Per sliding scale        Review of Systems  Immunization History  Administered Date(s) Administered   DTaP, 5 pertussis antigens 09/07/2016   Influenza Split 05/09/2010, 03/02/2011, 03/21/2012, 04/06/2013, 03/26/2014, 04/03/2020   Influenza, High Dose Seasonal PF 04/06/2013, 04/02/2016, 03/31/2017   Influenza, Quadrivalent, Recombinant, Inj, Pf 03/14/2018, 03/06/2019, 05/15/2020   Influenza,inj,Quad PF,6+ Mos 05/01/2015   Moderna SARS-COV2 Booster Vaccination  04/30/2020, 11/19/2020   Moderna Sars-Covid-2 Vaccination 06/26/2019, 07/24/2019   Pneumococcal Conjugate-13 01/31/2014, 08/22/2015   Pneumococcal Polysaccharide-23 05/21/2008, 05/23/2009   Td 05/18/2007, 05/23/2009   Tdap 03/03/2012   Zoster, Live 05/21/2008, 05/23/2009   Pertinent  Health Maintenance Due  Topic Date Due   FOOT EXAM  Never done   OPHTHALMOLOGY EXAM  Never done   COLONOSCOPY (Pts 45-35yrs Insurance coverage will need to be confirmed)  04/13/2019   INFLUENZA VACCINE  01/20/2021   HEMOGLOBIN A1C  06/27/2021   No flowsheet data found. Functional Status Survey:    Vitals:   04/14/21 1502  BP: (!) 149/83  Pulse: 69  Resp: 17  Temp: 98 F (36.7 C)  SpO2: 95%  Weight: 161 lb 3.2 oz (73.1 kg)  Height: 5\' 5"  (1.651 m)   Body mass index is 26.83 kg/m. Physical Exam  Labs reviewed: Recent Labs    07/20/20 0428 07/21/20 0359 07/21/20 0359 08/02/20 1412 09/13/20 0000 12/25/20 0000 03/11/21 0000  NA 142 143   < > 149* 137 144 140  K 2.9* 3.3*  --  4.0 4.4 3.6 4.5  CL 104 104  --  110* 102 110* 107  CO2 29 28  --  23 22 27* 27*  GLUCOSE 155* 69*  --  246*  --   --   --   BUN 15 17   < > 18 13 21 14   CREATININE 0.98 1.03  --  1.14 1.1 1.1 1.0  CALCIUM 8.7* 8.7*  --  9.2 8.6*  --  8.8  MG 2.3  --   --   --   --   --   --    < > = values in this interval not displayed.   Recent Labs    05/01/20 1637 07/19/20 1450 07/20/20 0428 09/13/20 0000  AST 23 104* 24 19  ALT 25 37 26 15  ALKPHOS 76 77 80 96  BILITOT 0.9 1.5* 0.6  --   PROT 6.4* 6.3* 6.3*  --   ALBUMIN 3.2* 3.3* 3.2* 3.4*   Recent Labs    05/01/20 1637 07/19/20 1450 07/19/20 2209 09/13/20 0000 12/25/20 0000  WBC 6.8 10.8* 8.3 18.2 4.9  NEUTROABS 5.4 8.7*  --  15,252.00  --   HGB 14.0 14.4 13.5 15.9 13.3*  HCT 41.6 43.1 40.2 47 40*  MCV 94.5 92.9 92.4  --   --   PLT 171 185 172 252 175   Lab Results  Component Value Date   TSH 4.53 12/25/2020   Lab Results  Component Value  Date   HGBA1C 7.3 12/25/2020   No results found for: CHOL, HDL, LDLCALC, LDLDIRECT, TRIG, CHOLHDL  Significant Diagnostic Results in last 30 days:  No results found.  Assessment/Plan There are no diagnoses linked to this encounter.   Family/  staff Communication:   Labs/tests ordered:

## 2021-04-14 NOTE — Assessment & Plan Note (Signed)
The patient was found to have scattered small red bumps made up of small blood vessels/capillaries abd, chest, back, and legs R>L. No itching, bleeding, or pain noted. Observe.

## 2021-04-14 NOTE — Assessment & Plan Note (Signed)
off Donepezil, Hospice service, total dependent of ADLs.

## 2021-04-14 NOTE — Assessment & Plan Note (Signed)
Chronic buttock/sacrum, continue Nystatin, keep the area dry and clean

## 2021-04-14 NOTE — Assessment & Plan Note (Signed)
stopped Labetalol, no recurrence reported 

## 2021-04-14 NOTE — Assessment & Plan Note (Addendum)
Blood pressure is controlled, takes Amlodipine, Lisinopril Bun/creat 14/1.0 03/11/21

## 2021-04-14 NOTE — Assessment & Plan Note (Signed)
takes Levothyroxine, TSH 4.53 12/24/20

## 2021-04-15 ENCOUNTER — Encounter: Payer: Self-pay | Admitting: Nurse Practitioner

## 2021-04-24 ENCOUNTER — Encounter: Payer: Self-pay | Admitting: Nurse Practitioner

## 2021-04-24 ENCOUNTER — Non-Acute Institutional Stay (SKILLED_NURSING_FACILITY): Payer: Medicare Other | Admitting: Nurse Practitioner

## 2021-04-24 DIAGNOSIS — E1159 Type 2 diabetes mellitus with other circulatory complications: Secondary | ICD-10-CM | POA: Diagnosis not present

## 2021-04-24 DIAGNOSIS — I10 Essential (primary) hypertension: Secondary | ICD-10-CM

## 2021-04-24 DIAGNOSIS — R269 Unspecified abnormalities of gait and mobility: Secondary | ICD-10-CM | POA: Diagnosis not present

## 2021-04-24 DIAGNOSIS — E039 Hypothyroidism, unspecified: Secondary | ICD-10-CM

## 2021-04-24 DIAGNOSIS — R55 Syncope and collapse: Secondary | ICD-10-CM

## 2021-04-24 DIAGNOSIS — F01C0 Vascular dementia, severe, without behavioral disturbance, psychotic disturbance, mood disturbance, and anxiety: Secondary | ICD-10-CM

## 2021-04-24 NOTE — Assessment & Plan Note (Signed)
off Donepezil, Hospice service

## 2021-04-24 NOTE — Assessment & Plan Note (Signed)
Blood pressure is controlled, takes Amlodipine, Lisinopril Bun/creat 14/1.0 03/11/21

## 2021-04-24 NOTE — Assessment & Plan Note (Addendum)
the patient needs assistance for transfer, frequent falling due to attempting transferring self w/o calling for assistance, w/c for mobility.

## 2021-04-24 NOTE — Assessment & Plan Note (Signed)
takes Levothyroxine, TSH 4.53 12/24/20

## 2021-04-24 NOTE — Progress Notes (Signed)
Location:   [facility] [place] Room Number: 72 Place of Service:  SNF (31) Provider: Lennie Odor Sharmaine Bain NP  Cianni Manny X, NP  Patient Care Team: Sirinity Outland X, NP as PCP - General (Internal Medicine) [name], MD as PCP - Cardiology (Cardiology) [name], MD as Consulting Physician (Cardiology)  Extended Emergency Contact Information Primary Emergency Contact: Renninger,[name] Address: [address]          [place], [place] [address] [place] Phone: [phone] Mobile Phone: [phone] Relation: Spouse  Code Status:  DNR Goals of care: Advanced Directive information Advanced Directives 04/24/2021  Does Patient Have a Medical Advance Directive? Yes  Type of Advance Directive Out of facility DNR (pink MOST or yellow form)  Does patient want to make changes to medical advance directive? No - Patient declined  Copy of [place] in Chart? -  Would patient like information on creating a medical advance directive? -  Pre-existing out of facility DNR order (yellow form or pink MOST form) Pink MOST form placed in chart (order not valid for inpatient use);Yellow form placed in chart (order not valid for inpatient use)     Chief Complaint  Patient presents with   Medical Management of Chronic Issues   Quality Metric Gaps    Foot exam, Eye exam, Hep C screen, Shingrix, Colonoscopy    HPI:  Pt is a 80 y.o. male seen today for medical management of chronic diseases.     Gait difficulty, the patient needs assistance for transfer, frequent falling due to attempting transferring self w/o calling for assistance, w/c for mobility.              T2DM Hgb a1c 7.3 12/24/20, hs snack to avoid low CBG in am. On Tresiba,  Novology 4 u (120-200), 6u (201-300), 8u (301-400)             Syncope, stopped Labetalol, no recurrence reported             HTN, takes Amlodipine, Lisinopril Bun/creat 14/1.0 03/11/21             Hypothyroidism, takes  Levothyroxine, TSH 4.53 12/24/20             Dementia, off Donepezil, Hospice service   Past Medical History:  Diagnosis Date   Aortic regurgitation    mild by echo 08/2019   Carotid artery stenosis    1-39% bilateral stenosis by dopplers 08/2019   Cataract    Complication of anesthesia    "high blood sugar; he's been loopy last 36h since OR"- 2016   Diabetes mellitus    DKA, type 2 ([place])    Hearing loss    Hypercholesteremia    Hypertension    Insulin pump in place    Macular degeneration    MCI (mild cognitive impairment) with memory loss 12/12/2014   OSA on CPAP 09/11/2014   PONV (postoperative nausea and vomiting)    Uses hearing aid    right ear, patient stated he lost left hearing aid    Past Surgical History:  Procedure Laterality Date   CHOLECYSTECTOMY N/A 10/28/2015   Procedure: LAPAROSCOPIC CHOLECYSTECTOMY WITH   INTRAOPERATIVE CHOLANGIOGRAM;  Surgeon: Greer Pickerel, MD;  Location: [facility];  Service: General;  Laterality: N/A;   SHOULDER ARTHROSCOPY Right 08/24/11   "frozen shoulder; RCR w/labrum tear; arthritis scraping; bone spurs"   SHOULDER ARTHROSCOPY Left ~ 2006   TONSILLECTOMY  1949   "in childhood"  No Known Allergies  Allergies as of 04/24/2021   No Known Allergies      Medication List        Accurate as of April 24, 2021 11:59 PM. If you have any questions, ask your nurse or doctor.          amLODipine 10 MG tablet Commonly known as: NORVASC Take 10 mg by mouth every evening.   aspirin EC 81 MG tablet Take 81 mg by mouth daily.   insulin aspart 100 UNIT/ML injection Commonly known as: novoLOG Inject 4-8 Units into the skin 3 (three) times daily with meals. If blood sugar is 120-200, give 4 units If blood sugar is 201-300, give 6 units If blood sugar is 301-400, give 8 units If blood sugar is greater than 400, call MD   levothyroxine 25 MCG tablet Commonly known as: SYNTHROID Take 25 mcg by mouth daily before breakfast.   lisinopril 30  MG tablet Commonly known as: ZESTRIL Take 30 mg by mouth daily.   nystatin-triamcinolone cream Commonly known as: MYCOLOG II Apply 1 application topically 2 (two) times daily.   [name] FlexTouch 100 UNIT/ML FlexTouch Pen Generic drug: insulin degludec Inject 18 Units into the skin in the morning. Per sliding scale        Review of Systems  Unable to perform ROS: Dementia   Immunization History  Administered Date(s) Administered   DTaP, 5 pertussis antigens 09/07/2016   Influenza Split 05/09/2010, 03/02/2011, 03/21/2012, 04/06/2013, 03/26/2014, 04/03/2020   Influenza, High Dose Seasonal PF 04/06/2013, 04/02/2016, 03/31/2017   Influenza, Quadrivalent, Recombinant, Inj, Pf 03/14/2018, 03/06/2019, 05/15/2020   Influenza,inj,Quad PF,6+ Mos 05/01/2015   Influenza-Unspecified 04/09/2021   Moderna SARS-COV2 Booster Vaccination 04/30/2020, 11/19/2020   Moderna Sars-Covid-2 Vaccination 06/26/2019, 07/24/2019   PFIZER(Purple Top)SARS-COV-2 Vaccination 04/09/2021   Pneumococcal Conjugate-13 01/31/2014, 08/22/2015   Pneumococcal Polysaccharide-23 05/21/2008, 05/23/2009   Td 05/18/2007, 05/23/2009   Tdap 03/03/2012   Zoster, Live 05/21/2008, 05/23/2009   Pertinent  Health Maintenance Due  Topic Date Due   FOOT EXAM  Never done   OPHTHALMOLOGY EXAM  Never done   COLONOSCOPY (Pts 45-27yrs Insurance coverage will need to be confirmed)  04/13/2019   HEMOGLOBIN A1C  06/27/2021   INFLUENZA VACCINE  Completed   Fall Risk 07/25/2019 08/28/2019 07/20/2020 07/21/2020 07/21/2020  Patient Fall Risk Level Moderate fall risk High fall risk High fall risk High fall risk High fall risk   Functional Status Survey:    Vitals:   04/24/21 1431  BP: 120/70  Pulse: 78  Resp: 18  Temp: 97.7 F (36.5 C)  SpO2: 96%  Weight: 159 lb 6.4 oz (72.3 kg)  Height: 5\' 5"  (1.651 m)   Body mass index is 26.53 kg/m. Physical Exam Vitals and nursing note reviewed.  Constitutional:      Appearance: Normal  appearance.  HENT:     Head: Normocephalic and atraumatic.     Mouth/Throat:     Mouth: Mucous membranes are moist.  Eyes:     Extraocular Movements: Extraocular movements intact.     Conjunctiva/sclera: Conjunctivae normal.     Pupils: Pupils are equal, round, and reactive to light.  Cardiovascular:     Rate and Rhythm: Normal rate and regular rhythm.     Heart sounds: No murmur heard. Pulmonary:     Effort: Pulmonary effort is normal.     Breath sounds: No rales.  Abdominal:     General: Bowel sounds are normal.     Palpations: Abdomen is soft.  Musculoskeletal:     Cervical back: Normal range of motion and neck supple.     Right lower leg: No edema.     Left lower leg: No edema.  Skin:    General: Skin is warm and dry.     Comments: Dark red discoloration of R+L buttocks and sacrum. Scattered cherry angioma chest, abd, back, BLE R>L, angioma.   Neurological:     General: No focal deficit present.     Mental Status: He is alert.     Motor: No weakness.     Coordination: Coordination normal.     Gait: Gait abnormal.     Comments: Oriented to self.   Psychiatric:     Comments: Alert, followed simple directions    Labs reviewed: Recent Labs    07/20/20 0428 07/21/20 0359 07/21/20 0359 08/02/20 1412 09/13/20 0000 12/25/20 0000 03/11/21 0000  NA 142 143   < > 149* 137 144 140  K 2.9* 3.3*  --  4.0 4.4 3.6 4.5  CL 104 104  --  110* 102 110* 107  CO2 29 28  --  23 22 27* 27*  GLUCOSE 155* 69*  --  246*  --   --   --   BUN 15 17   < > 18 13 21 14   CREATININE 0.98 1.03  --  1.14 1.1 1.1 1.0  CALCIUM 8.7* 8.7*  --  9.2 8.6*  --  8.8  MG 2.3  --   --   --   --   --   --    < > = values in this interval not displayed.   Recent Labs    05/01/20 1637 07/19/20 1450 07/20/20 0428 09/13/20 0000  AST 23 104* 24 19  ALT 25 37 26 15  ALKPHOS 76 77 80 96  BILITOT 0.9 1.5* 0.6  --   PROT 6.4* 6.3* 6.3*  --   ALBUMIN 3.2* 3.3* 3.2* 3.4*   Recent Labs     05/01/20 1637 07/19/20 1450 07/19/20 2209 09/13/20 0000 12/25/20 0000  WBC 6.8 10.8* 8.3 18.2 4.9  NEUTROABS 5.4 8.7*  --  15,252.00  --   HGB 14.0 14.4 13.5 15.9 13.3*  HCT 41.6 43.1 40.2 47 40*  MCV 94.5 92.9 92.4  --   --   PLT 171 185 172 252 175   Lab Results  Component Value Date   TSH 4.53 12/25/2020   Lab Results  Component Value Date   HGBA1C 7.3 12/25/2020   No results found for: CHOL, HDL, LDLCALC, LDLDIRECT, TRIG, CHOLHDL  Significant Diagnostic Results in last 30 days:  No results found.  Assessment/Plan  Gait abnormality the patient needs assistance for transfer, frequent falling due to attempting transferring self w/o calling for assistance, w/c for mobility.   Type 2 diabetes mellitus with vascular disease (HCC) Hgb a1c 7.3 12/24/20, hs snack to avoid low CBG in am. On Tresiba,  Novology 4 u (120-200), 6u (201-300), 8u (301-400)  Syncope with normal neurologic examination stopped Labetalol, no recurrence reported  HTN (hypertension) Blood pressure is controlled, takes Amlodipine, Lisinopril Bun/creat 14/1.0 03/11/21  Hypothyroidism  takes Levothyroxine, TSH 4.53 12/24/20  Vascular dementia ([place]) off Donepezil, Hospice service    Family/ staff Communication: plan of care reviewed with the patient and charge nurse.   Labs/tests ordered:  none  Time spend 25 minutes.

## 2021-04-24 NOTE — Assessment & Plan Note (Signed)
stopped Labetalol, no recurrence reported 

## 2021-04-24 NOTE — Assessment & Plan Note (Signed)
Hgb a1c 7.3 12/24/20, hs snack to avoid low CBG in am. On Tresiba,  Novology 4 u (120-200), 6u (201-300), 8u (301-400)

## 2021-04-29 ENCOUNTER — Encounter: Payer: Self-pay | Admitting: Nurse Practitioner

## 2021-05-13 ENCOUNTER — Telehealth: Payer: Self-pay | Admitting: *Deleted

## 2021-05-13 NOTE — Telephone Encounter (Signed)
Called and spoke with patient's spouse, [name], to advise that I will be making the initial palliative care visit tomorrow between 10:30/11a. He was recently discharged from hospice services for a greater than 6 month prognosis and referred to palliative care.

## 2021-05-14 ENCOUNTER — Other Ambulatory Visit: Payer: Self-pay

## 2021-05-14 ENCOUNTER — Non-Acute Institutional Stay: Payer: Medicare PPO | Admitting: *Deleted

## 2021-05-14 DIAGNOSIS — Z515 Encounter for palliative care: Secondary | ICD-10-CM

## 2021-05-20 ENCOUNTER — Non-Acute Institutional Stay (SKILLED_NURSING_FACILITY): Payer: Medicare Other | Admitting: Internal Medicine

## 2021-05-20 ENCOUNTER — Encounter: Payer: Self-pay | Admitting: Internal Medicine

## 2021-05-20 DIAGNOSIS — F01C0 Vascular dementia, severe, without behavioral disturbance, psychotic disturbance, mood disturbance, and anxiety: Secondary | ICD-10-CM

## 2021-05-20 DIAGNOSIS — E1159 Type 2 diabetes mellitus with other circulatory complications: Secondary | ICD-10-CM | POA: Diagnosis not present

## 2021-05-20 DIAGNOSIS — I10 Essential (primary) hypertension: Secondary | ICD-10-CM | POA: Diagnosis not present

## 2021-05-20 DIAGNOSIS — E039 Hypothyroidism, unspecified: Secondary | ICD-10-CM | POA: Diagnosis not present

## 2021-05-20 NOTE — Progress Notes (Signed)
Location:   [facility] Room Number: 27 Place of Service:  SNF (31) Provider:  Veleta Miners MD  Mast, Man X, NP  Patient Care Team: Mast, Man X, NP as PCP - General (Internal Medicine) [name], MD as PCP - Cardiology (Cardiology) [name], MD as Consulting Physician (Cardiology)  Extended Emergency Contact Information Primary Emergency Contact: Groot,[name] Address: [address]          [place], [place] [address] [name] Phone: [phone] Mobile Phone: [phone] Relation: Spouse  Code Status:  DNR Managed Care Goals of care: Advanced Directive information Advanced Directives 05/20/2021  Does Patient Have a Medical Advance Directive? Yes  Type of Advance Directive Out of facility DNR (pink MOST or yellow form)  Does patient want to make changes to medical advance directive? No - Patient declined  Copy of [place] in Chart? -  Would patient like information on creating a medical advance directive? -  Pre-existing out of facility DNR order (yellow form or pink MOST form) Pink MOST form placed in chart (order not valid for inpatient use);Yellow form placed in chart (order not valid for inpatient use)     Chief Complaint  Patient presents with   Medical Management of Chronic Issues   Quality Metric Gaps    FOOT EXAM (Yearly)  OPHTHALMOLOGY EXAM (Yearly) Hepatitis C Screening (Once) Zoster Vaccines- Shingrix (1 of 2) COLONOSCOPY (Pts 45-43yrs Insurance coverage will need to be confirmed) (Every 5 Years)      HPI:  Pt is a 80 y.o. male seen today for medical management of chronic diseases.     Patient has a history of diabetes type 2, hypertension, HLD, carotid stenosis history of OSA on CPAP Also has a history of unexplained syncope.s/p ILR Patient also has a history of dementia  Alert But has aphasia Lives in SNF Discharged from Hospice as has stabilized He is stable. No  new Nursing issues. No Behavior issues His weight is stable No Falls Wt Readings from Last 3 Encounters:  05/20/21 159 lb 6.4 oz (72.3 kg)  04/24/21 159 lb 6.4 oz (72.3 kg)  04/14/21 161 lb 3.2 oz (73.1 kg)   Past Medical History:  Diagnosis Date   Aortic regurgitation    mild by echo 08/2019   Carotid artery stenosis    1-39% bilateral stenosis by dopplers 08/2019   Cataract    Complication of anesthesia    "high blood sugar; he's been loopy last 36h since OR"- 2016   Diabetes mellitus    DKA, type 2 ([place])    Hearing loss    Hypercholesteremia    Hypertension    Insulin pump in place    Macular degeneration    MCI (mild cognitive impairment) with memory loss 12/12/2014   OSA on CPAP 09/11/2014   PONV (postoperative nausea and vomiting)    Uses hearing aid    right ear, patient stated he lost left hearing aid    Past Surgical History:  Procedure Laterality Date   CHOLECYSTECTOMY N/A 10/28/2015   Procedure: LAPAROSCOPIC CHOLECYSTECTOMY WITH   INTRAOPERATIVE CHOLANGIOGRAM;  Surgeon: Greer Pickerel, MD;  Location: [facility];  Service: General;  Laterality: N/A;   SHOULDER ARTHROSCOPY Right 08/24/11   "frozen shoulder; RCR w/labrum tear; arthritis scraping; bone spurs"   SHOULDER ARTHROSCOPY Left ~ 2006   TONSILLECTOMY  1949   "in childhood"    No Known Allergies  Allergies as of 05/20/2021  No Known Allergies      Medication List        Accurate as of May 20, 2021 11:59 AM. If you have any questions, ask your nurse or doctor.          amLODipine 10 MG tablet Commonly known as: NORVASC Take 10 mg by mouth every evening.   aspirin EC 81 MG tablet Take 81 mg by mouth daily.   insulin aspart 100 UNIT/ML injection Commonly known as: novoLOG Inject 4-8 Units into the skin 3 (three) times daily with meals. If blood sugar is 120-200, give 4 units If blood sugar is 201-300, give 6 units If blood sugar is 301-400, give 8 units If blood sugar is greater than 400, call  MD   levothyroxine 25 MCG tablet Commonly known as: SYNTHROID Take 25 mcg by mouth daily before breakfast.   lisinopril 30 MG tablet Commonly known as: ZESTRIL Take 30 mg by mouth daily.   nystatin-triamcinolone cream Commonly known as: MYCOLOG II Apply 1 application topically 2 (two) times daily.   [name] FlexTouch 100 UNIT/ML FlexTouch Pen Generic drug: insulin degludec Inject 18 Units into the skin in the morning. Per sliding scale        Review of Systems  Unable to perform ROS: Dementia   Immunization History  Administered Date(s) Administered   DTaP, 5 pertussis antigens 09/07/2016   Influenza Split 05/09/2010, 03/02/2011, 03/21/2012, 04/06/2013, 03/26/2014, 04/03/2020   Influenza, High Dose Seasonal PF 04/06/2013, 04/02/2016, 03/31/2017   Influenza, Quadrivalent, Recombinant, Inj, Pf 03/14/2018, 03/06/2019, 05/15/2020   Influenza,inj,Quad PF,6+ Mos 05/01/2015   Influenza-Unspecified 04/09/2021   Moderna SARS-COV2 Booster Vaccination 04/30/2020, 11/19/2020   Moderna Sars-Covid-2 Vaccination 06/26/2019, 07/24/2019   PFIZER(Purple Top)SARS-COV-2 Vaccination 04/09/2021   Pneumococcal Conjugate-13 01/31/2014, 08/22/2015   Pneumococcal Polysaccharide-23 05/21/2008, 05/23/2009   Td 05/18/2007, 05/23/2009   Tdap 03/03/2012   Zoster, Live 05/21/2008, 05/23/2009   Pertinent  Health Maintenance Due  Topic Date Due   FOOT EXAM  Never done   OPHTHALMOLOGY EXAM  Never done   COLONOSCOPY (Pts 45-41yrs Insurance coverage will need to be confirmed)  04/13/2019   HEMOGLOBIN A1C  06/27/2021   INFLUENZA VACCINE  Completed   Fall Risk 07/25/2019 08/28/2019 07/20/2020 07/21/2020 07/21/2020  Patient Fall Risk Level Moderate fall risk High fall risk High fall risk High fall risk High fall risk   Functional Status Survey:    Vitals:   05/20/21 1155  BP: (!) 146/84  Pulse: 74  Resp: 16  Temp: 98.1 F (36.7 C)  SpO2: 97%  Weight: 159 lb 6.4 oz (72.3 kg)  Height: 5\' 5"  (1.651 m)    Body mass index is 26.53 kg/m. Physical Exam Vitals reviewed.  Constitutional:      Appearance: Normal appearance.  HENT:     Head: Normocephalic.     Mouth/Throat:     Mouth: Mucous membranes are moist.     Pharynx: Oropharynx is clear.  Eyes:     Pupils: Pupils are equal, round, and reactive to light.  Cardiovascular:     Rate and Rhythm: Normal rate and regular rhythm.     Pulses: Normal pulses.     Heart sounds: No murmur heard. Pulmonary:     Effort: Pulmonary effort is normal. No respiratory distress.     Breath sounds: Normal breath sounds. No rales.  Abdominal:     General: Abdomen is flat. Bowel sounds are normal.     Palpations: Abdomen is soft.  Musculoskeletal:  General: No swelling.     Cervical back: Neck supple.  Skin:    General: Skin is warm.  Neurological:     General: No focal deficit present.     Mental Status: He is alert.     Comments: Has aphasia Respond to his name but does not follow any commands  Psychiatric:        Mood and Affect: Mood normal.        Thought Content: Thought content normal.    Labs reviewed: Recent Labs    07/20/20 0428 07/21/20 0359 07/21/20 0359 08/02/20 1412 09/13/20 0000 12/25/20 0000 03/11/21 0000  NA 142 143   < > 149* 137 144 140  K 2.9* 3.3*  --  4.0 4.4 3.6 4.5  CL 104 104  --  110* 102 110* 107  CO2 29 28  --  23 22 27* 27*  GLUCOSE 155* 69*  --  246*  --   --   --   BUN 15 17   < > 18 13 21 14   CREATININE 0.98 1.03  --  1.14 1.1 1.1 1.0  CALCIUM 8.7* 8.7*  --  9.2 8.6*  --  8.8  MG 2.3  --   --   --   --   --   --    < > = values in this interval not displayed.   Recent Labs    07/19/20 1450 07/20/20 0428 09/13/20 0000  AST 104* 24 19  ALT 37 26 15  ALKPHOS 77 80 96  BILITOT 1.5* 0.6  --   PROT 6.3* 6.3*  --   ALBUMIN 3.3* 3.2* 3.4*   Recent Labs    07/19/20 1450 07/19/20 2209 09/13/20 0000 12/25/20 0000  WBC 10.8* 8.3 18.2 4.9  NEUTROABS 8.7*  --  15,252.00  --   HGB 14.4  13.5 15.9 13.3*  HCT 43.1 40.2 47 40*  MCV 92.9 92.4  --   --   PLT 185 172 252 175   Lab Results  Component Value Date   TSH 4.53 12/25/2020   Lab Results  Component Value Date   HGBA1C 7.3 12/25/2020   No results found for: CHOL, HDL, LDLCALC, LDLDIRECT, TRIG, CHOLHDL  Significant Diagnostic Results in last 30 days:  No results found.  Assessment/Plan 1. Type 2 diabetes mellitus with vascular disease (HCC) Repeat A1C CBG 120-250 Covering with Novolog  2. Hypothyroidism, unspecified type Repeat TSH  3. Primary hypertension Stable on Lisinopril and Norvasc  4. Severe vascular dementia without behavioral disturbance, psychotic disturbance, mood disturbance, or anxiety Supportive care Family does not want aggressive measures Discharged from Hospice right now   Family/ staff Communication:   Labs/tests ordered:  A1C,CBC,CMP,TSh

## 2021-05-29 DIAGNOSIS — N390 Urinary tract infection, site not specified: Secondary | ICD-10-CM | POA: Diagnosis not present

## 2021-05-29 DIAGNOSIS — E038 Other specified hypothyroidism: Secondary | ICD-10-CM | POA: Diagnosis not present

## 2021-05-29 DIAGNOSIS — I10 Essential (primary) hypertension: Secondary | ICD-10-CM | POA: Diagnosis not present

## 2021-05-29 DIAGNOSIS — D72829 Elevated white blood cell count, unspecified: Secondary | ICD-10-CM | POA: Diagnosis not present

## 2021-05-29 DIAGNOSIS — E119 Type 2 diabetes mellitus without complications: Secondary | ICD-10-CM | POA: Diagnosis not present

## 2021-05-29 LAB — HEPATIC FUNCTION PANEL
ALT: 8 — AB (ref 10–40)
AST: 11 — AB (ref 14–40)
Alkaline Phosphatase: 88 (ref 25–125)
Bilirubin, Total: 0.4

## 2021-05-29 LAB — TSH: TSH: 4.26 (ref 0.41–5.90)

## 2021-05-29 LAB — CBC AND DIFFERENTIAL
HCT: 40 — AB (ref 41–53)
Hemoglobin: 13.7 (ref 13.5–17.5)
Neutrophils Absolute: 3584
Platelets: 182 (ref 150–399)
WBC: 5.6

## 2021-05-29 LAB — COMPREHENSIVE METABOLIC PANEL
Albumin: 3.3 — AB (ref 3.5–5.0)
Calcium: 8.9 (ref 8.7–10.7)
Globulin: 2.5

## 2021-05-29 LAB — BASIC METABOLIC PANEL
BUN: 17 (ref 4–21)
CO2: 24 — AB (ref 13–22)
Chloride: 112 — AB (ref 99–108)
Creatinine: 0.9 (ref 0.6–1.3)
Glucose: 122
Potassium: 3.6 (ref 3.4–5.3)
Sodium: 148 — AB (ref 137–147)

## 2021-05-29 LAB — CBC: RBC: 4.42 (ref 3.87–5.11)

## 2021-05-29 LAB — HEMOGLOBIN A1C: Hemoglobin A1C: 7.8

## 2021-05-30 ENCOUNTER — Encounter: Payer: Self-pay | Admitting: Nurse Practitioner

## 2021-05-30 ENCOUNTER — Non-Acute Institutional Stay (SKILLED_NURSING_FACILITY): Payer: Medicare PPO | Admitting: Nurse Practitioner

## 2021-05-30 DIAGNOSIS — E870 Hyperosmolality and hypernatremia: Secondary | ICD-10-CM

## 2021-05-30 DIAGNOSIS — F01C0 Vascular dementia, severe, without behavioral disturbance, psychotic disturbance, mood disturbance, and anxiety: Secondary | ICD-10-CM

## 2021-05-30 DIAGNOSIS — I10 Essential (primary) hypertension: Secondary | ICD-10-CM

## 2021-05-30 DIAGNOSIS — R296 Repeated falls: Secondary | ICD-10-CM

## 2021-05-30 DIAGNOSIS — E039 Hypothyroidism, unspecified: Secondary | ICD-10-CM

## 2021-05-30 DIAGNOSIS — E1159 Type 2 diabetes mellitus with other circulatory complications: Secondary | ICD-10-CM

## 2021-05-30 NOTE — Assessment & Plan Note (Signed)
takes Levothyroxine, TSH 4.53 12/24/20. 4.26 05/29/21

## 2021-05-30 NOTE — Assessment & Plan Note (Signed)
Hgb a1c 7.3 12/24/20<<7.8 05/29/21, hs snack to avoid low CBG in am. On Tresiba,  Novology coverage

## 2021-05-30 NOTE — Progress Notes (Addendum)
Location:   SNF [facility] Room Number: 67 A Place of Service:  SNF (31) Provider: Lennie Odor Media Pizzini NP  Vittorio Mohs X, NP  Patient Care Team: Kaileia Flow X, NP as PCP - General (Internal Medicine) [name], MD as PCP - Cardiology (Cardiology) [name], MD as Consulting Physician (Cardiology)  Extended Emergency Contact Information Primary Emergency Contact: Crader,[name] Address: [address]          [place], [place] [address] [place] Phone: [phone] Mobile Phone: [phone] Relation: Spouse  Code Status: DNR Goals of care: Advanced Directive information Advanced Directives 05/30/2021  Does Patient Have a Medical Advance Directive? Yes  Type of Advance Directive Out of facility DNR (pink MOST or yellow form)  Does patient want to make changes to medical advance directive? No - Patient declined  Copy of [place] in Chart? -  Would patient like information on creating a medical advance directive? -  Pre-existing out of facility DNR order (yellow form or pink MOST form) Pink MOST form placed in chart (order not valid for inpatient use);Yellow form placed in chart (order not valid for inpatient use)     Chief Complaint  Patient presents with   Acute Visit    Acute Visit for elevated Sodium    HPI:  Pt is a 80 y.o. male seen today for an acute visit for elevated Na 148 05/29/21 Gait difficulty, the patient needs assistance for transfer, frequent falling due to attempting transferring self w/o calling for assistance, w/c for mobility.              T2DM Hgb a1c 7.3 12/24/20<<7.8 05/29/21, hs snack to avoid low CBG in am. On Tresiba,  [employer] coverage             Syncope, stopped Labetalol, no recurrence reported             HTN, takes Amlodipine, Lisinopril Bun/creat 17/0.90, eGFR 87 05/29/21             Hypothyroidism, takes Levothyroxine, TSH 4.53 12/24/20. 4.26 05/29/21             Dementia, off Donepezil, Hospice  service     Past Medical History:  Diagnosis Date   Aortic regurgitation    mild by echo 08/2019   Carotid artery stenosis    1-39% bilateral stenosis by dopplers 08/2019   Cataract    Complication of anesthesia    "high blood sugar; he's been loopy last 36h since OR"- 2016   Diabetes mellitus    DKA, type 2 ([place])    Hearing loss    Hypercholesteremia    Hypertension    Insulin pump in place    Macular degeneration    MCI (mild cognitive impairment) with memory loss 12/12/2014   OSA on CPAP 09/11/2014   PONV (postoperative nausea and vomiting)    Uses hearing aid    right ear, patient stated he lost left hearing aid    Past Surgical History:  Procedure Laterality Date   CHOLECYSTECTOMY N/A 10/28/2015   Procedure: LAPAROSCOPIC CHOLECYSTECTOMY WITH   INTRAOPERATIVE CHOLANGIOGRAM;  Surgeon: Greer Pickerel, MD;  Location: [facility];  Service: General;  Laterality: N/A;   SHOULDER ARTHROSCOPY Right 08/24/11   "frozen shoulder; RCR w/labrum tear; arthritis scraping; bone spurs"   SHOULDER ARTHROSCOPY Left ~ 2006   TONSILLECTOMY  1949   "in childhood"    No Known Allergies  Allergies as of 05/30/2021  No Known Allergies      Medication List        Accurate as of May 30, 2021 11:59 PM. If you have any questions, ask your nurse or doctor.          amLODipine 10 MG tablet Commonly known as: NORVASC Take 10 mg by mouth daily.   aspirin EC 81 MG tablet Take 81 mg by mouth daily.   insulin aspart 100 UNIT/ML injection Commonly known as: novoLOG Inject 4-8 Units into the skin 3 (three) times daily with meals. If blood sugar is 120-200, give 4 units If blood sugar is 201-300, give 6 units If blood sugar is 301-400, give 8 units If blood sugar is greater than 400, call MD   levothyroxine 25 MCG tablet Commonly known as: SYNTHROID Take 25 mcg by mouth daily before breakfast.   lisinopril 30 MG tablet Commonly known as: ZESTRIL Take 30 mg by mouth daily.   nystatin  powder Commonly known as: MYCOSTATIN/NYSTOP Apply 1 application topically 3 (three) times daily. Apply to right and left buttocks with incontinent care every shift   nystatin-triamcinolone cream Commonly known as: MYCOLOG II Apply 1 application topically 2 (two) times daily.   [name] FlexTouch 100 UNIT/ML FlexTouch Pen Generic drug: insulin degludec Inject 18 Units into the skin in the morning. Per sliding scale        Review of Systems  Unable to perform ROS: Dementia   Immunization History  Administered Date(s) Administered   DTaP, 5 pertussis antigens 09/07/2016   Influenza Split 05/09/2010, 03/02/2011, 03/21/2012, 04/06/2013, 03/26/2014, 04/03/2020   Influenza, High Dose Seasonal PF 04/06/2013, 04/02/2016, 03/31/2017   Influenza, Quadrivalent, Recombinant, Inj, Pf 03/14/2018, 03/06/2019, 05/15/2020   Influenza,inj,Quad PF,6+ Mos 05/01/2015   Influenza-Unspecified 04/09/2021   Moderna SARS-COV2 Booster Vaccination 04/30/2020, 11/19/2020   Moderna Sars-Covid-2 Vaccination 06/26/2019, 07/24/2019   PFIZER(Purple Top)SARS-COV-2 Vaccination 04/09/2021   Pneumococcal Conjugate-13 01/31/2014, 08/22/2015   Pneumococcal Polysaccharide-23 05/21/2008, 05/23/2009   Td 05/18/2007, 05/23/2009   Tdap 03/03/2012   Zoster, Live 05/21/2008, 05/23/2009   Pertinent  Health Maintenance Due  Topic Date Due   FOOT EXAM  Never done   OPHTHALMOLOGY EXAM  Never done   COLONOSCOPY (Pts 45-63yr Insurance coverage will need to be confirmed)  04/13/2019   HEMOGLOBIN A1C  11/27/2021   INFLUENZA VACCINE  Completed   Fall Risk 07/25/2019 08/28/2019 07/20/2020 07/21/2020 07/21/2020  Patient Fall Risk Level Moderate fall risk High fall risk High fall risk High fall risk High fall risk   Functional Status Survey:    Vitals:   05/30/21 1521  BP: 140/86  Pulse: 60  Resp: 16  Temp: (!) 97.5 F (36.4 C)  SpO2: 95%  Weight: 161 lb 6.4 oz (73.2 kg)  Height: _0  (1.651 m)   Body mass index is 26.86  kg/m. Physical Exam Vitals and nursing note reviewed.  Constitutional:      Appearance: Normal appearance.  HENT:     Head: Normocephalic and atraumatic.     Mouth/Throat:     Mouth: Mucous membranes are moist.  Eyes:     Extraocular Movements: Extraocular movements intact.     Conjunctiva/sclera: Conjunctivae normal.     Pupils: Pupils are equal, round, and reactive to light.  Cardiovascular:     Rate and Rhythm: Normal rate and regular rhythm.     Heart sounds: No murmur heard. Pulmonary:     Effort: Pulmonary effort is normal.     Breath sounds: No rales.  Abdominal:  General: Bowel sounds are normal.     Palpations: Abdomen is soft.  Musculoskeletal:     Cervical back: Normal range of motion and neck supple.     Right lower leg: No edema.     Left lower leg: No edema.  Skin:    General: Skin is warm and dry.     Comments: Dark red discoloration of R+L buttocks and sacrum. Scattered cherry angioma chest, abd, back, BLE R>L, angioma.   Neurological:     General: No focal deficit present.     Mental Status: He is alert.     Motor: No weakness.     Coordination: Coordination normal.     Gait: Gait abnormal.     Comments: Oriented to self.   Psychiatric:     Comments: Alert, followed simple directions    Labs reviewed: Recent Labs    07/20/20 0428 07/21/20 0359 07/21/20 0359 08/02/20 1412 08/02/20 1412 09/13/20 0000 12/25/20 0000 03/11/21 0000 05/29/21 0000  NA 142 143   < > 149*  --  137 144 140 148*  K 2.9* 3.3*  --  4.0  --  4.4 3.6 4.5 3.6  CL 104 104  --  110*  --  102 110* 107 112*  CO2 29 28  --  23  --  22 27* 27* 24*  GLUCOSE 155* 69*  --  246*  --   --   --   --   --   BUN 15 17   < > 18  --  _0 CREATININE 0.98 1.03  --  1.14   < > 1.1 1.1 1.0 0.9  CALCIUM 8.7* 8.7*  --  9.2  --  8.6*  --  8.8 8.9  MG 2.3  --   --   --   --   --   --   --   --    < > = values in this interval not displayed.   Recent Labs    07/19/20 1450  07/20/20 0428 09/13/20 0000 05/29/21 0000  AST 104* 24 19 11*  ALT 37 26 15 8*  ALKPHOS 77 80 96 88  BILITOT 1.5* 0.6  --   --   PROT 6.3* 6.3*  --   --   ALBUMIN 3.3* 3.2* 3.4* 3.3*   Recent Labs    07/19/20 1450 07/19/20 2209 09/13/20 0000 12/25/20 0000 05/29/21 0000  WBC 10.8* 8.3 18.2 4.9 5.6  NEUTROABS 8.7*  --  15,252.00  --  3,584.00  HGB 14.4 13.5 15.9 13.3* 13.7  HCT 43.1 40.2 47 40* 40*  MCV 92.9 92.4  --   --   --   PLT 185 172 252 175 182   Lab Results  Component Value Date   TSH 4.26 05/29/2021   Lab Results  Component Value Date   HGBA1C 7.8 05/29/2021   No results found for: CHOL, HDL, LDLCALC, LDLDIRECT, TRIG, CHOLHDL  Significant Diagnostic Results in last 30 days:  No results found.  Assessment/Plan: Hypernatremia Na 148 05/29/21, will encourage oral fluid intakes, repeat BMP one week.   Frequent falls Gait difficulty, the patient needs assistance for transfer, frequent falling due to attempting transferring self w/o calling for assistance, w/c for mobility.   Type 2 diabetes mellitus with vascular disease (HCC) Hgb a1c 7.3 12/24/20<<7.8 05/29/21, hs snack to avoid low CBG in am. On Tresiba,  [facility] coverage  HTN (hypertension) Blood pressure is controlled, takes Amlodipine, Lisinopril Bun/creat 17/0.90,  eGFR 87 05/29/21  Hypothyroidism takes Levothyroxine, TSH 4.53 12/24/20. 4.26 05/29/21  Vascular dementia ([place]) SNF [facility], Hospice service for supportive care. Off Donepezil.     Family/ staff Communication: plan of care reviewed with the patient and charge nurse.   Labs/tests ordered:  BMP one week  Time spend 25 minutes.

## 2021-05-30 NOTE — Assessment & Plan Note (Signed)
Na 148 05/29/21, will encourage oral fluid intakes, repeat BMP one week.

## 2021-05-30 NOTE — Assessment & Plan Note (Signed)
Blood pressure is controlled, takes Amlodipine, Lisinopril Bun/creat 17/0.90, eGFR 87 05/29/21

## 2021-05-30 NOTE — Assessment & Plan Note (Signed)
Gait difficulty, the patient needs assistance for transfer, frequent falling due to attempting transferring self w/o calling for assistance, w/c for mobility.

## 2021-05-30 NOTE — Assessment & Plan Note (Signed)
SNF [facility], Hospice service for supportive care. Off Donepezil.

## 2021-06-02 ENCOUNTER — Encounter: Payer: Self-pay | Admitting: Nurse Practitioner

## 2021-06-05 DIAGNOSIS — E119 Type 2 diabetes mellitus without complications: Secondary | ICD-10-CM | POA: Diagnosis not present

## 2021-06-05 LAB — BASIC METABOLIC PANEL
BUN: 14 (ref 4–21)
CO2: 30 — AB (ref 13–22)
Chloride: 110 — AB (ref 99–108)
Creatinine: 0.9 (ref 0.6–1.3)
Glucose: 126
Potassium: 3.7 (ref 3.4–5.3)
Sodium: 146 (ref 137–147)

## 2021-06-18 ENCOUNTER — Non-Acute Institutional Stay (SKILLED_NURSING_FACILITY): Payer: Medicare PPO | Admitting: Orthopedic Surgery

## 2021-06-18 ENCOUNTER — Encounter: Payer: Self-pay | Admitting: Orthopedic Surgery

## 2021-06-18 DIAGNOSIS — E870 Hyperosmolality and hypernatremia: Secondary | ICD-10-CM | POA: Diagnosis not present

## 2021-06-18 DIAGNOSIS — I10 Essential (primary) hypertension: Secondary | ICD-10-CM | POA: Diagnosis not present

## 2021-06-18 DIAGNOSIS — F01C0 Vascular dementia, severe, without behavioral disturbance, psychotic disturbance, mood disturbance, and anxiety: Secondary | ICD-10-CM | POA: Diagnosis not present

## 2021-06-18 DIAGNOSIS — L03115 Cellulitis of right lower limb: Secondary | ICD-10-CM | POA: Diagnosis not present

## 2021-06-18 DIAGNOSIS — E039 Hypothyroidism, unspecified: Secondary | ICD-10-CM | POA: Diagnosis not present

## 2021-06-18 DIAGNOSIS — E1159 Type 2 diabetes mellitus with other circulatory complications: Secondary | ICD-10-CM

## 2021-06-18 MED ORDER — DOXYCYCLINE HYCLATE 100 MG PO TABS: 100.0000 mg | ORAL_TABLET | Freq: Two times a day (BID) | ORAL | 0 refills | Status: DC

## 2021-06-18 NOTE — Progress Notes (Signed)
Location:   [facility] Room Number: 70 Place of Service:  SNF (31) Provider:  Windell Moulding, NP  Mast, Man X, NP  Patient Care Team: Mast, Man X, NP as PCP - General (Internal Medicine) [name], MD as PCP - Cardiology (Cardiology) [name], MD as Consulting Physician (Cardiology)  Extended Emergency Contact Information Primary Emergency Contact: Agresta,[name] Address: [address]          [place], [place] [address] [name] Phone: [phone] Mobile Phone: [phone] Relation: Spouse  Code Status:  DNR Goals of care: Advanced Directive information Advanced Directives 06/18/2021  Does Patient Have a Medical Advance Directive? Yes  Type of Advance Directive Out of facility DNR (pink MOST or yellow form)  Does patient want to make changes to medical advance directive? No - Patient declined  Copy of [place] in Chart? -  Would patient like information on creating a medical advance directive? -  Pre-existing out of facility DNR order (yellow form or pink MOST form) Yellow form placed in chart (order not valid for inpatient use);Pink MOST form placed in chart (order not valid for inpatient use)     Chief Complaint  Patient presents with   Acute Visit    Skin tear    HPI:  Pt is a 80 y.o. male seen today for an acute visit for right shin skin tear.   Nursing reports skin tear to right shin first noticed 12/08. Wound nurse has been applying triple antibiotic ointment and covering with dressing daily. Today, nurse reports increased redness around skin tear. Scab to area also has been reopened. He is a poor historian due to dementia.   Dementia- no recent behavioral outbursts, continues to have poor safety awareness- forgets to use call bell, donepezil discontinued T2DM- A1c 7.8 05/29/2021, no recent hypoglycemic events, sugars averaging 250-350's, remains on Tresiba and SSI HTN- BUN/creat  17/0.9 05/29/2021, remains on lisinopril and amlodipine Hypothyroidism- TSH 4.26 05/29/2021, remains on levothyroxine Hypernatremia- Na 148 05/29/2021, Na 146 06/06/2021   Past Medical History:  Diagnosis Date   Aortic regurgitation    mild by echo 08/2019   Carotid artery stenosis    1-39% bilateral stenosis by dopplers 08/2019   Cataract    Complication of anesthesia    "high blood sugar; he's been loopy last 36h since OR"- 2016   Diabetes mellitus    DKA, type 2 ([place])    Hearing loss    Hypercholesteremia    Hypertension    Insulin pump in place    Macular degeneration    MCI (mild cognitive impairment) with memory loss 12/12/2014   OSA on CPAP 09/11/2014   PONV (postoperative nausea and vomiting)    Uses hearing aid    right ear, patient stated he lost left hearing aid    Past Surgical History:  Procedure Laterality Date   CHOLECYSTECTOMY N/A 10/28/2015   Procedure: LAPAROSCOPIC CHOLECYSTECTOMY WITH   INTRAOPERATIVE CHOLANGIOGRAM;  Surgeon: Greer Pickerel, MD;  Location: [facility];  Service: General;  Laterality: N/A;   SHOULDER ARTHROSCOPY Right 08/24/11   "frozen shoulder; RCR w/labrum tear; arthritis scraping; bone spurs"   SHOULDER ARTHROSCOPY Left ~ 2006   TONSILLECTOMY  1949   "in childhood"    No Known Allergies  Allergies as of 06/18/2021   No Known Allergies      Medication List        Accurate as of June 18, 2021  1:57  PM. If you have any questions, ask your nurse or doctor.          amLODipine 10 MG tablet Commonly known as: NORVASC Take 10 mg by mouth daily.   aspirin EC 81 MG tablet Take 81 mg by mouth daily.   insulin aspart 100 UNIT/ML injection Commonly known as: novoLOG Inject 4-8 Units into the skin 3 (three) times daily with meals. If blood sugar is 120-200, give 4 units If blood sugar is 201-300, give 6 units If blood sugar is 301-400, give 8 units If blood sugar is greater than 400, call MD   levothyroxine 25 MCG tablet Commonly  known as: SYNTHROID Take 25 mcg by mouth daily before breakfast.   lisinopril 30 MG tablet Commonly known as: ZESTRIL Take 30 mg by mouth daily.   nystatin powder Commonly known as: MYCOSTATIN/NYSTOP Apply 1 application topically 3 (three) times daily. Apply to right and left buttocks with incontinent care every shift   nystatin-triamcinolone cream Commonly known as: MYCOLOG II Apply 1 application topically 2 (two) times daily.   [name] FlexTouch 100 UNIT/ML FlexTouch Pen Generic drug: insulin degludec Inject 18 Units into the skin in the morning. Per sliding scale        Review of Systems  Unable to perform ROS: Dementia   Immunization History  Administered Date(s) Administered   DTaP, 5 pertussis antigens 09/07/2016   Influenza Split 05/09/2010, 03/02/2011, 03/21/2012, 04/06/2013, 03/26/2014, 04/03/2020   Influenza, High Dose Seasonal PF 04/06/2013, 04/02/2016, 03/31/2017   Influenza, Quadrivalent, Recombinant, Inj, Pf 03/14/2018, 03/06/2019, 05/15/2020   Influenza,inj,Quad PF,6+ Mos 05/01/2015   Influenza-Unspecified 04/09/2021   Moderna SARS-COV2 Booster Vaccination 04/30/2020, 11/19/2020   Moderna Sars-Covid-2 Vaccination 06/26/2019, 07/24/2019   PFIZER(Purple Top)SARS-COV-2 Vaccination 04/09/2021   Pneumococcal Conjugate-13 01/31/2014, 08/22/2015   Pneumococcal Polysaccharide-23 05/21/2008, 05/23/2009   Td 05/18/2007, 05/23/2009   Tdap 03/03/2012   Zoster, Live 05/21/2008, 05/23/2009   Pertinent  Health Maintenance Due  Topic Date Due   FOOT EXAM  Never done   OPHTHALMOLOGY EXAM  Never done   COLONOSCOPY (Pts 45-62yrs Insurance coverage will need to be confirmed)  04/13/2019   HEMOGLOBIN A1C  11/27/2021   INFLUENZA VACCINE  Completed   Fall Risk 07/25/2019 08/28/2019 07/20/2020 07/21/2020 07/21/2020  Patient Fall Risk Level Moderate fall risk High fall risk High fall risk High fall risk High fall risk   Functional Status Survey:    Vitals:   06/18/21 1348   BP: (!) 154/83  Pulse: 80  Resp: 18  Temp: 97.7 F (36.5 C)  SpO2: 95%  Weight: 161 lb 6.4 oz (73.2 kg)  Height: 5\' 5"  (1.651 m)   Body mass index is 26.86 kg/m. Physical Exam Vitals reviewed.  Constitutional:      General: He is not in acute distress. Eyes:     General:        Right eye: No discharge.        Left eye: No discharge.  Cardiovascular:     Rate and Rhythm: Normal rate and regular rhythm.     Pulses: Normal pulses.     Heart sounds: Normal heart sounds. No murmur heard. Pulmonary:     Effort: Pulmonary effort is normal. No respiratory distress.     Breath sounds: Normal breath sounds. No wheezing.  Abdominal:     General: Bowel sounds are normal. There is no distension.     Palpations: Abdomen is soft.     Tenderness: There is no abdominal tenderness.  Musculoskeletal:  Right lower leg: No edema.     Left lower leg: No edema.  Skin:    General: Skin is warm and dry.     Capillary Refill: Capillary refill takes less than 2 seconds.     Findings: Lesion present.     Comments: 3 cm x 2 cm skin tear to right shin, scab with new area of breakdown, cannot visualize granulation tissue, no drainage, surrounding skin red, swollen and warm.    Neurological:     General: No focal deficit present.     Mental Status: He is alert. Mental status is at baseline.     Motor: Weakness present.     Gait: Gait abnormal.     Comments: wheelchair  Psychiatric:        Mood and Affect: Mood normal. Affect is flat.        Behavior: Behavior normal.        Cognition and Memory: Memory is impaired.    Labs reviewed: Recent Labs    07/20/20 0428 07/21/20 0359 07/21/20 0359 08/02/20 1412 08/02/20 1412 09/13/20 0000 12/25/20 0000 03/11/21 0000 05/29/21 0000  NA 142 143   < > 149*  --  137 144 140 148*  K 2.9* 3.3*  --  4.0  --  4.4 3.6 4.5 3.6  CL 104 104  --  110*  --  102 110* 107 112*  CO2 29 28  --  23  --  22 27* 27* 24*  GLUCOSE 155* 69*  --  246*  --   --    --   --   --   BUN 15 17   < > 18  --  13 21 14 17   CREATININE 0.98 1.03  --  1.14   < > 1.1 1.1 1.0 0.9  CALCIUM 8.7* 8.7*  --  9.2  --  8.6*  --  8.8 8.9  MG 2.3  --   --   --   --   --   --   --   --    < > = values in this interval not displayed.   Recent Labs    07/19/20 1450 07/20/20 0428 09/13/20 0000 05/29/21 0000  AST 104* 24 19 11*  ALT 37 26 15 8*  ALKPHOS 77 80 96 88  BILITOT 1.5* 0.6  --   --   PROT 6.3* 6.3*  --   --   ALBUMIN 3.3* 3.2* 3.4* 3.3*   Recent Labs    07/19/20 1450 07/19/20 2209 09/13/20 0000 12/25/20 0000 05/29/21 0000  WBC 10.8* 8.3 18.2 4.9 5.6  NEUTROABS 8.7*  --  15,252.00  --  3,584.00  HGB 14.4 13.5 15.9 13.3* 13.7  HCT 43.1 40.2 47 40* 40*  MCV 92.9 92.4  --   --   --   PLT 185 172 252 175 182   Lab Results  Component Value Date   TSH 4.26 05/29/2021   Lab Results  Component Value Date   HGBA1C 7.8 05/29/2021   No results found for: CHOL, HDL, LDLCALC, LDLDIRECT, TRIG, CHOLHDL  Significant Diagnostic Results in last 30 days:  No results found.  Assessment/Plan 1. Cellulitis of right lower extremity - skin tear noticed 20 days ago- slow healing - scab with recent breakdown, surrounding skin with new redness/ warmth/ and swelling - start doxycycline 100 mg po bid x 7 days - cont dressing changes per wound nurse  2. Severe vascular dementia without behavioral disturbance, psychotic disturbance,  mood disturbance, or anxiety - no recent behavioral outbursts - cont skilled nursing care - Donepezil discontinued  3. Type 2 diabetes mellitus with vascular disease ([place]) - A1c 7.8 05/29/2021 - no hypoglycemia - cont Tresiba,SSI and snack qhs  4. Primary hypertension - controlled - cont amlodipine and lisinopril  5. Hypothyroidism, unspecified type - TSH normal - cont levothyroxine  6. Hypernatremia - Na 146 06/06/2021 - encourage hydration  Family/ staff Communication: plan discussed with nurse  Labs/tests ordered:   none

## 2021-06-25 ENCOUNTER — Non-Acute Institutional Stay (SKILLED_NURSING_FACILITY): Payer: Medicare PPO | Admitting: Orthopedic Surgery

## 2021-06-25 ENCOUNTER — Encounter: Payer: Self-pay | Admitting: Orthopedic Surgery

## 2021-06-25 DIAGNOSIS — E1159 Type 2 diabetes mellitus with other circulatory complications: Secondary | ICD-10-CM

## 2021-06-25 DIAGNOSIS — I10 Essential (primary) hypertension: Secondary | ICD-10-CM | POA: Diagnosis not present

## 2021-06-25 DIAGNOSIS — E870 Hyperosmolality and hypernatremia: Secondary | ICD-10-CM

## 2021-06-25 DIAGNOSIS — F01C0 Vascular dementia, severe, without behavioral disturbance, psychotic disturbance, mood disturbance, and anxiety: Secondary | ICD-10-CM | POA: Diagnosis not present

## 2021-06-25 DIAGNOSIS — E039 Hypothyroidism, unspecified: Secondary | ICD-10-CM | POA: Diagnosis not present

## 2021-06-25 DIAGNOSIS — L03115 Cellulitis of right lower limb: Secondary | ICD-10-CM | POA: Diagnosis not present

## 2021-06-25 NOTE — Progress Notes (Signed)
Location:   [place] Room Number: [place] of Service:  SNF (31) Provider:  Yvonna Alanis, NP  Mast, Man X, NP  Patient Care Team: Mast, Man X, NP as PCP - General (Internal Medicine) [name], MD as PCP - Cardiology (Cardiology) [name], MD as Consulting Physician (Cardiology)  Extended Emergency Contact Information Primary Emergency Contact: Schweigert,[name] Address: [address]          [place], [place] [address] [name] of [place] Phone: [phone] Mobile Phone: [phone] Relation: Spouse  Code Status:  DNR Managed Care Goals of care: Advanced Directive information Advanced Directives 06/25/2021  Does Patient Have a Medical Advance Directive? Yes  Type of Advance Directive Out of facility DNR (pink MOST or yellow form)  Does patient want to make changes to medical advance directive? No - Patient declined  Copy of [place] in Chart? -  Would patient like information on creating a medical advance directive? -  Pre-existing out of facility DNR order (yellow form or pink MOST form) Yellow form placed in chart (order not valid for inpatient use);Pink MOST form placed in chart (order not valid for inpatient use)     Chief Complaint  Patient presents with   Acute Visit    hyperglycemia    HPI:  Pt is a 81 y.o. male seen today for an acute visit for hyperglycemia.   In the past few days his blood sugars have been averaging > 350. He is currently on Tresiba 18 units daily and SSI. A1c 7.8 05/29/2021. No recent hypoglycemic events. 12/28 he was seen for cellulitis on RLE. He was started on doxycycline x 7 days. Vitals stable.   Recent blood sugars:  01/04- 543  01/03- 496, 431, 354  01/02- 557, 350, 450  Dementia- no recent behavioral outbursts, continues to have poor safety awareness- forgets to use call bell, donepezil discontinued HTN- BUN/creat 17/0.9 05/29/2021, remains on lisinopril and  amlodipine Hypothyroidism- TSH 4.26 05/29/2021, remains on levothyroxine Hypernatremia- Na 148 05/29/2021, Na 146 06/06/2021     Past Medical History:  Diagnosis Date   Aortic regurgitation    mild by echo 08/2019   Carotid artery stenosis    1-39% bilateral stenosis by dopplers 08/2019   Cataract    Complication of anesthesia    "high blood sugar; he's been loopy last 36h since OR"- 2016   Diabetes mellitus    DKA, type 2 ([place])    Hearing loss    Hypercholesteremia    Hypertension    Insulin pump in place    Macular degeneration    MCI (mild cognitive impairment) with memory loss 12/12/2014   OSA on CPAP 09/11/2014   PONV (postoperative nausea and vomiting)    Uses hearing aid    right ear, patient stated he lost left hearing aid    Past Surgical History:  Procedure Laterality Date   CHOLECYSTECTOMY N/A 10/28/2015   Procedure: LAPAROSCOPIC CHOLECYSTECTOMY WITH   INTRAOPERATIVE CHOLANGIOGRAM;  Surgeon: Greer Pickerel, MD;  Location: [facility];  Service: General;  Laterality: N/A;   SHOULDER ARTHROSCOPY Right 08/24/11   "frozen shoulder; RCR w/labrum tear; arthritis scraping; bone spurs"   SHOULDER ARTHROSCOPY Left ~ 2006   TONSILLECTOMY  1949   "in childhood"    No Known Allergies  Allergies as of 06/25/2021   No Known Allergies      Medication List        Accurate as of June 25, 2021  3:11 PM. If you have any questions, ask your nurse or doctor.          STOP taking these medications    doxycycline 100 MG tablet Commonly known as: VIBRA-TABS Stopped by: Yvonna Alanis, NP       TAKE these medications    amLODipine 10 MG tablet Commonly known as: NORVASC Take 10 mg by mouth daily.   aspirin EC 81 MG tablet Take 81 mg by mouth daily.   insulin aspart 100 UNIT/ML injection Commonly known as: novoLOG Inject 4-8 Units into the skin 3 (three) times daily with meals. If blood sugar is 120-200, give 4 units If blood sugar is 201-300, give 6 units If blood sugar  is 301-400, give 8 units If blood sugar is greater than 400, call MD   levothyroxine 25 MCG tablet Commonly known as: SYNTHROID Take 25 mcg by mouth daily before breakfast.   lisinopril 30 MG tablet Commonly known as: ZESTRIL Take 30 mg by mouth daily.   nystatin powder Commonly known as: MYCOSTATIN/NYSTOP Apply 1 application topically 3 (three) times daily. Apply to right and left buttocks with incontinent care every shift   nystatin-triamcinolone cream Commonly known as: MYCOLOG II Apply 1 application topically 2 (two) times daily.   [name] FlexTouch 100 UNIT/ML FlexTouch Pen Generic drug: insulin degludec Inject 18 Units into the skin in the morning. Per sliding scale        Review of Systems  Unable to perform ROS: Dementia   Immunization History  Administered Date(s) Administered   DTaP, 5 pertussis antigens 09/07/2016   Influenza Split 05/09/2010, 03/02/2011, 03/21/2012, 04/06/2013, 03/26/2014, 04/03/2020   Influenza, High Dose Seasonal PF 04/06/2013, 04/02/2016, 03/31/2017   Influenza, Quadrivalent, Recombinant, Inj, Pf 03/14/2018, 03/06/2019, 05/15/2020   Influenza,inj,Quad PF,6+ Mos 05/01/2015   Influenza-Unspecified 04/09/2021   Moderna SARS-COV2 Booster Vaccination 04/30/2020, 11/19/2020   Moderna Sars-Covid-2 Vaccination 06/26/2019, 07/24/2019   PFIZER(Purple Top)SARS-COV-2 Vaccination 04/09/2021   Pneumococcal Conjugate-13 01/31/2014, 08/22/2015   Pneumococcal Polysaccharide-23 05/21/2008, 05/23/2009   Td 05/18/2007, 05/23/2009   Tdap 03/03/2012   Zoster, Live 05/21/2008, 05/23/2009   Pertinent  Health Maintenance Due  Topic Date Due   FOOT EXAM  Never done   OPHTHALMOLOGY EXAM  Never done   COLONOSCOPY (Pts 45-67yrs Insurance coverage will need to be confirmed)  06/18/2022 (Originally 04/13/2019)   HEMOGLOBIN A1C  11/27/2021   INFLUENZA VACCINE  Completed   Fall Risk 07/25/2019 08/28/2019 07/20/2020 07/21/2020 07/21/2020  Patient Fall Risk Level  Moderate fall risk High fall risk High fall risk High fall risk High fall risk   Functional Status Survey:    Vitals:   06/25/21 1506  BP: (!) 143/83  Pulse: 89  Resp: 17  Temp: 98 F (36.7 C)  SpO2: 98%  Weight: 159 lb 6.4 oz (72.3 kg)  Height: 5\' 5"  (1.651 m)   Body mass index is 26.53 kg/m. Physical Exam Vitals reviewed.  Constitutional:      General: He is not in acute distress. Eyes:     General:        Right eye: No discharge.        Left eye: No discharge.  Neck:     Vascular: No carotid bruit.  Cardiovascular:     Rate and Rhythm: Normal rate and regular rhythm.     Pulses: Normal pulses.     Heart sounds: Normal heart sounds. No murmur heard. Pulmonary:     Effort: Pulmonary effort is normal. No respiratory  distress.     Breath sounds: Normal breath sounds. No wheezing.  Abdominal:     General: Bowel sounds are normal. There is no distension.     Palpations: Abdomen is soft.     Tenderness: There is no abdominal tenderness.  Musculoskeletal:     Cervical back: Normal range of motion.     Right lower leg: No edema.     Left lower leg: No edema.  Lymphadenopathy:     Cervical: No cervical adenopathy.  Skin:    General: Skin is warm and dry.     Capillary Refill: Capillary refill takes less than 2 seconds.     Comments: Skin tear to RLE CDI, no drainage, redness improved, surrounding skin intact.   Neurological:     General: No focal deficit present.     Mental Status: He is alert. Mental status is at baseline.     Motor: Weakness present.     Gait: Gait abnormal.  Psychiatric:        Mood and Affect: Mood normal.        Behavior: Behavior normal.        Cognition and Memory: Memory is impaired.     Comments: Nonverbal during encounter    Labs reviewed: Recent Labs    07/20/20 0428 07/21/20 0359 07/21/20 0359 08/02/20 1412 09/13/20 0000 12/25/20 0000 03/11/21 0000 05/29/21 0000 06/05/21 0000  NA 142 143   < > 149* 137   < > 140 148* 146   K 2.9* 3.3*  --  4.0 4.4   < > 4.5 3.6 3.7  CL 104 104  --  110* 102   < > 107 112* 110*  CO2 29 28  --  23 22   < > 27* 24* 30*  GLUCOSE 155* 69*  --  246*  --   --   --   --   --   BUN 15 17   < > 18 13   < > 14 17 14   CREATININE 0.98 1.03  --  1.14 1.1   < > 1.0 0.9 0.9  CALCIUM 8.7* 8.7*  --  9.2 8.6*  --  8.8 8.9  --   MG 2.3  --   --   --   --   --   --   --   --    < > = values in this interval not displayed.   Recent Labs    07/19/20 1450 07/20/20 0428 09/13/20 0000 05/29/21 0000  AST 104* 24 19 11*  ALT 37 26 15 8*  ALKPHOS 77 80 96 88  BILITOT 1.5* 0.6  --   --   PROT 6.3* 6.3*  --   --   ALBUMIN 3.3* 3.2* 3.4* 3.3*   Recent Labs    07/19/20 1450 07/19/20 2209 09/13/20 0000 12/25/20 0000 05/29/21 0000  WBC 10.8* 8.3 18.2 4.9 5.6  NEUTROABS 8.7*  --  15,252.00  --  3,584.00  HGB 14.4 13.5 15.9 13.3* 13.7  HCT 43.1 40.2 47 40* 40*  MCV 92.9 92.4  --   --   --   PLT 185 172 252 175 182   Lab Results  Component Value Date   TSH 4.26 05/29/2021   Lab Results  Component Value Date   HGBA1C 7.8 05/29/2021   No results found for: CHOL, HDL, LDLCALC, LDLDIRECT, TRIG, CHOLHDL  Significant Diagnostic Results in last 30 days:  No results found.  Assessment/Plan 1. Type 2 diabetes  mellitus with vascular disease ([place]) - A1c 7.8 05/29/2021 - sugars averaging 350-500 - suspect infection as cause?  - increase Tresiba to 20 units daily - cont SSI - cbc/diff   2. Cellulitis of right lower extremity - RLE skin tear improved with doxycycline x 7 days  3. Severe vascular dementia without behavioral disturbance, psychotic disturbance, mood disturbance, or anxiety - no behavioral outbursts - forgets to use call bell - discharged from hospice at this time - cont skilled nursing care  4. Primary hypertension - controlled - cont lisinopril and amlodipine  5. Hypothyroidism, unspecified type - TSH stable  - cont levothyroxine  6. Hypernatremia - Na 146  12/16    Family/ staff Communication: plan discussed with patient and nurse  Labs/tests ordered:  cbc/diff

## 2021-06-26 DIAGNOSIS — E119 Type 2 diabetes mellitus without complications: Secondary | ICD-10-CM | POA: Diagnosis not present

## 2021-06-26 LAB — CBC AND DIFFERENTIAL
HCT: 38 — AB (ref 41–53)
Hemoglobin: 12.7 — AB (ref 13.5–17.5)
Neutrophils Absolute: 3708
Platelets: 215 (ref 150–399)
WBC: 7.2

## 2021-06-26 LAB — CBC: RBC: 4.12 (ref 3.87–5.11)

## 2021-07-04 ENCOUNTER — Non-Acute Institutional Stay (SKILLED_NURSING_FACILITY): Payer: Medicare PPO | Admitting: Nurse Practitioner

## 2021-07-04 ENCOUNTER — Encounter: Payer: Self-pay | Admitting: Nurse Practitioner

## 2021-07-04 DIAGNOSIS — I10 Essential (primary) hypertension: Secondary | ICD-10-CM

## 2021-07-04 DIAGNOSIS — F01C0 Vascular dementia, severe, without behavioral disturbance, psychotic disturbance, mood disturbance, and anxiety: Secondary | ICD-10-CM

## 2021-07-04 DIAGNOSIS — E039 Hypothyroidism, unspecified: Secondary | ICD-10-CM

## 2021-07-04 DIAGNOSIS — E1159 Type 2 diabetes mellitus with other circulatory complications: Secondary | ICD-10-CM | POA: Diagnosis not present

## 2021-07-04 DIAGNOSIS — R269 Unspecified abnormalities of gait and mobility: Secondary | ICD-10-CM

## 2021-07-04 DIAGNOSIS — E870 Hyperosmolality and hypernatremia: Secondary | ICD-10-CM | POA: Diagnosis not present

## 2021-07-04 NOTE — Progress Notes (Signed)
Location:  [place] Room Number: N027-A Place of Service:  SNF (31) Provider:  Mylynn Dinh X, NP  Patient Care Team: Jeury Mcnab X, NP as PCP - General (Internal Medicine) [name], MD as PCP - Cardiology (Cardiology) [name], MD as Consulting Physician (Cardiology)  Extended Emergency Contact Information Primary Emergency Contact: Pequignot,[name] Address: [address]          [place], [place] [address] [name] of [place] Phone: [phone] Mobile Phone: [phone] Relation: Spouse  Code Status:  DNR Goals of care: Advanced Directive information Advanced Directives 07/04/2021  Does Patient Have a Medical Advance Directive? Yes  Type of Advance Directive Out of facility DNR (pink MOST or yellow form)  Does patient want to make changes to medical advance directive? No - Patient declined  Copy of [place] in Chart? -  Would patient like information on creating a medical advance directive? -  Pre-existing out of facility DNR order (yellow form or pink MOST form) Pink MOST form placed in chart (order not valid for inpatient use)     Chief Complaint  Patient presents with   Medical Management of Chronic Issues    Routine visit. Foot exam today. Discuss need for shingrix and covid boosters or post pone if patient refuses.     HPI:  Pt is a 81 y.o. male seen today for medical management of chronic diseases.    RLE cellulitis, healed, treated with 7 day course of Doxy.  Gait difficulty, the patient needs assistance for transfer, frequent falling due to attempting transferring self w/o calling for assistance, w/c for mobility.              T2DM Hgb a1c 7.3 12/24/20<<7.8 05/29/21, hs snack to avoid low CBG in am. On Tresiba(increased to 20u 06/25/21)  Novology coverage, fasting CBG 12612/15/22             Syncope, stopped Labetalol, no recurrence reported             HTN, takes Amlodipine, Lisinopril Bun/creat  14/0.9 06/05/21             Hypothyroidism, takes Levothyroxine, TSH 4.53 12/24/20. 4.26 05/29/21             Dementia, off Donepezil, SNF [facility] for supportive care  Hypernatremia, Na 146 06/05/21    Past Medical History:  Diagnosis Date   Aortic regurgitation    mild by echo 08/2019   Carotid artery stenosis    1-39% bilateral stenosis by dopplers 08/2019   Cataract    Complication of anesthesia    "high blood sugar; he's been loopy last 36h since OR"- 2016   Diabetes mellitus    DKA, type 2 ([place])    Hearing loss    Hypercholesteremia    Hypertension    Insulin pump in place    Macular degeneration    MCI (mild cognitive impairment) with memory loss 12/12/2014   OSA on CPAP 09/11/2014   PONV (postoperative nausea and vomiting)    Uses hearing aid    right ear, patient stated he lost left hearing aid    Past Surgical History:  Procedure Laterality Date   CHOLECYSTECTOMY N/A 10/28/2015   Procedure: LAPAROSCOPIC CHOLECYSTECTOMY WITH   INTRAOPERATIVE CHOLANGIOGRAM;  Surgeon: Greer Pickerel, MD;  Location: [facility];  Service: General;  Laterality: N/A;   SHOULDER ARTHROSCOPY Right 08/24/11   "frozen shoulder; RCR w/labrum tear; arthritis scraping; bone spurs"  SHOULDER ARTHROSCOPY Left ~ 2006   TONSILLECTOMY  1949   "in childhood"    No Known Allergies  Outpatient Encounter Medications as of 07/04/2021  Medication Sig   amLODipine (NORVASC) 10 MG tablet Take 10 mg by mouth daily.   aspirin EC 81 MG tablet Take 81 mg by mouth daily.   insulin aspart (NOVOLOG) 100 UNIT/ML injection Inject 6-12 Units into the skin 3 (three) times daily with meals. If Blood Sugar is 150 to 200, give 6 Units. If Blood Sugar is 201 to 250, give 8 Units. If Blood Sugar is 251 to 300, give 10 Units. If Blood Sugar is 301 to 450, give 12 Units. If Blood Sugar is greater than 450, call MD.   levothyroxine (SYNTHROID) 25 MCG tablet Take 25 mcg by mouth daily before breakfast.   lisinopril (ZESTRIL) 30 MG tablet  Take 30 mg by mouth daily.   nystatin (MYCOSTATIN/NYSTOP) powder Apply 1 application topically 3 (three) times daily. Apply to right and left buttocks with incontinent care every shift   nystatin-triamcinolone (MYCOLOG II) cream Apply 1 application topically 2 (two) times daily.   TRESIBA FLEXTOUCH 100 UNIT/ML FlexTouch Pen Inject 20 Units into the skin in the morning. Per sliding scale   No facility-administered encounter medications on file as of 07/04/2021.    Review of Systems  Unable to perform ROS: Dementia   Immunization History  Administered Date(s) Administered   DTaP, 5 pertussis antigens 09/07/2016   Influenza Split 05/09/2010, 03/02/2011, 03/21/2012, 04/06/2013, 03/26/2014, 04/03/2020   Influenza, High Dose Seasonal PF 04/06/2013, 04/02/2016, 03/31/2017   Influenza, Quadrivalent, Recombinant, Inj, Pf 03/14/2018, 03/06/2019, 05/15/2020   Influenza,inj,Quad PF,6+ Mos 05/01/2015   Influenza-Unspecified 04/09/2021   Moderna SARS-COV2 Booster Vaccination 04/30/2020, 11/19/2020   Moderna Sars-Covid-2 Vaccination 06/26/2019, 07/24/2019   PFIZER(Purple Top)SARS-COV-2 Vaccination 04/09/2021   Pneumococcal Conjugate-13 01/31/2014, 08/22/2015   Pneumococcal Polysaccharide-23 05/21/2008, 05/23/2009   Td 05/18/2007, 05/23/2009   Tdap 03/03/2012   Zoster, Live 05/21/2008, 05/23/2009   Pertinent  Health Maintenance Due  Topic Date Due   FOOT EXAM  Never done   OPHTHALMOLOGY EXAM  Never done   COLONOSCOPY (Pts 45-62yrs Insurance coverage will need to be confirmed)  06/18/2022 (Originally 04/13/2019)   HEMOGLOBIN A1C  11/27/2021   INFLUENZA VACCINE  Completed   Fall Risk 07/25/2019 08/28/2019 07/20/2020 07/21/2020 07/21/2020  Patient Fall Risk Level Moderate fall risk High fall risk High fall risk High fall risk High fall risk   Functional Status Survey:    Vitals:   07/04/21 0926  BP: 135/87  Pulse: 83  Resp: 17  Temp: 97.7 F (36.5 C)  SpO2: 95%  Weight: 159 lb 6.4 oz (72.3 kg)   Height: 5\' 5"  (1.651 m)   Body mass index is 26.53 kg/m. Physical Exam Vitals and nursing note reviewed.  Constitutional:      Appearance: Normal appearance.  HENT:     Head: Normocephalic and atraumatic.     Mouth/Throat:     Mouth: Mucous membranes are moist.  Eyes:     Extraocular Movements: Extraocular movements intact.     Conjunctiva/sclera: Conjunctivae normal.     Pupils: Pupils are equal, round, and reactive to light.  Cardiovascular:     Rate and Rhythm: Normal rate and regular rhythm.     Heart sounds: No murmur heard. Pulmonary:     Effort: Pulmonary effort is normal.     Breath sounds: No rales.  Abdominal:     General: Bowel sounds are normal.  Palpations: Abdomen is soft.  Musculoskeletal:     Cervical back: Normal range of motion and neck supple.     Right lower leg: No edema.     Left lower leg: No edema.  Skin:    General: Skin is warm and dry.     Comments: Dark red discoloration of R+L buttocks and sacrum. Scattered cherry angioma chest, abd, back, BLE R>L, angioma.   Neurological:     General: No focal deficit present.     Mental Status: He is alert.     Motor: No weakness.     Coordination: Coordination normal.     Gait: Gait abnormal.     Comments: Oriented to self.   Psychiatric:     Comments: Alert, smiled.     Labs reviewed: Recent Labs    07/20/20 0428 07/21/20 0359 07/21/20 0359 08/02/20 1412 09/13/20 0000 12/25/20 0000 03/11/21 0000 05/29/21 0000 06/05/21 0000  NA 142 143   < > 149* 137   < > 140 148* 146  K 2.9* 3.3*  --  4.0 4.4   < > 4.5 3.6 3.7  CL 104 104  --  110* 102   < > 107 112* 110*  CO2 29 28  --  23 22   < > 27* 24* 30*  GLUCOSE 155* 69*  --  246*  --   --   --   --   --   BUN 15 17   < > 18 13   < > 14 17 14   CREATININE 0.98 1.03  --  1.14 1.1   < > 1.0 0.9 0.9  CALCIUM 8.7* 8.7*  --  9.2 8.6*  --  8.8 8.9  --   MG 2.3  --   --   --   --   --   --   --   --    < > = values in this interval not displayed.    Recent Labs    07/19/20 1450 07/20/20 0428 09/13/20 0000 05/29/21 0000  AST 104* 24 19 11*  ALT 37 26 15 8*  ALKPHOS 77 80 96 88  BILITOT 1.5* 0.6  --   --   PROT 6.3* 6.3*  --   --   ALBUMIN 3.3* 3.2* 3.4* 3.3*   Recent Labs    07/19/20 1450 07/19/20 2209 09/13/20 0000 12/25/20 0000 05/29/21 0000  WBC 10.8* 8.3 18.2 4.9 5.6  NEUTROABS 8.7*  --  15,252.00  --  3,584.00  HGB 14.4 13.5 15.9 13.3* 13.7  HCT 43.1 40.2 47 40* 40*  MCV 92.9 92.4  --   --   --   PLT 185 172 252 175 182   Lab Results  Component Value Date   TSH 4.26 05/29/2021   Lab Results  Component Value Date   HGBA1C 7.8 05/29/2021   No results found for: CHOL, HDL, LDLCALC, LDLDIRECT, TRIG, CHOLHDL  Significant Diagnostic Results in last 30 days:  No results found.  Assessment/Plan HTN (hypertension) Blood pressure is controlled, takes Amlodipine, Lisinopril Bun/creat 14/0.9 06/05/21  Hypothyroidism takes Levothyroxine, TSH 4.53 12/24/20. 4.26 05/29/21  Vascular dementia ([place])  off Donepezil, SNF [facility] for supportive care  Hypernatremia Na 146 06/05/21  Type 2 diabetes mellitus with vascular disease ([place]) Hgb a1c 7.3 12/24/20<<7.8 05/29/21, hs snack to avoid low CBG in am. On Tresiba(increased to 20u 06/25/21)  Novology coverage, fasting CBG 12612/15/22  Gait abnormality , the patient needs assistance for transfer, frequent falling due  to attempting transferring self w/o calling for assistance, w/c for mobility.      Family/ staff Communication: plan of care reviewed with the patient and charge nurse.   Labs/tests ordered: none  Time spend 35 minutes.

## 2021-07-07 ENCOUNTER — Encounter: Payer: Self-pay | Admitting: Nurse Practitioner

## 2021-07-07 NOTE — Assessment & Plan Note (Signed)
Na 146 06/05/21

## 2021-07-07 NOTE — Assessment & Plan Note (Signed)
off Donepezil, SNF [facility] for supportive care  

## 2021-07-07 NOTE — Assessment & Plan Note (Signed)
Blood pressure is controlled, takes Amlodipine, Lisinopril Bun/creat 14/0.9 06/05/21

## 2021-07-07 NOTE — Assessment & Plan Note (Signed)
Hgb a1c 7.3 12/24/20<<7.8 05/29/21, hs snack to avoid low CBG in am. On Tresiba(increased to 20u 06/25/21)  Novology coverage, fasting CBG 12612/15/22

## 2021-07-07 NOTE — Assessment & Plan Note (Signed)
,   the patient needs assistance for transfer, frequent falling due to attempting transferring self w/o calling for assistance, w/c for mobility.

## 2021-07-07 NOTE — Assessment & Plan Note (Signed)
takes Levothyroxine, TSH 4.53 12/24/20. 4.26 05/29/21

## 2021-07-18 ENCOUNTER — Encounter: Payer: Self-pay | Admitting: Nurse Practitioner

## 2021-07-18 ENCOUNTER — Non-Acute Institutional Stay (SKILLED_NURSING_FACILITY): Payer: Medicare PPO | Admitting: Nurse Practitioner

## 2021-07-18 DIAGNOSIS — I10 Essential (primary) hypertension: Secondary | ICD-10-CM | POA: Diagnosis not present

## 2021-07-18 DIAGNOSIS — E1159 Type 2 diabetes mellitus with other circulatory complications: Secondary | ICD-10-CM | POA: Diagnosis not present

## 2021-07-18 DIAGNOSIS — E039 Hypothyroidism, unspecified: Secondary | ICD-10-CM

## 2021-07-18 NOTE — Assessment & Plan Note (Signed)
Hgb a1c 7.3 12/24/20<<7.8 05/29/21, hs snack to avoid low CBG in am. On Tresiba(increased to 20u 06/25/21)  Novology coverage. Assist the patient with meals and snacks. Observe.

## 2021-07-18 NOTE — Assessment & Plan Note (Signed)
off Donepezil, SNF [facility] for supportive care  

## 2021-07-18 NOTE — Progress Notes (Signed)
Location:   [facility] Room Number: 9 Place of Service:  SNF (31) Provider: Lennie Odor Wadie Liew NP  Elmer Merwin X, NP  Patient Care Team: Mickenzie Stolar X, NP as PCP - General (Internal Medicine) [name], MD as PCP - Cardiology (Cardiology) [name], MD as Consulting Physician (Cardiology)  Extended Emergency Contact Information Primary Emergency Contact: Nasca,[name] Address: [address]          [place], [place] [address] [place] Phone: [phone] Mobile Phone: [phone] Relation: Spouse  Code Status: DNR Goals of care: Advanced Directive information Advanced Directives 07/04/2021  Does Patient Have a Medical Advance Directive? Yes  Type of Advance Directive Out of facility DNR (pink MOST or yellow form)  Does patient want to make changes to medical advance directive? No - Patient declined  Copy of [place] in Chart? -  Would patient like information on creating a medical advance directive? -  Pre-existing out of facility DNR order (yellow form or pink MOST form) Pink MOST form placed in chart (order not valid for inpatient use)     Chief Complaint  Patient presents with   Acute Visit    Low blood sugar.     HPI:  Pt is a 81 y.o. male seen today for an acute visit for low blood sugar episode.   T2DM Hgb a1c 7.3 12/24/20<<7.8 05/29/21, hs snack to avoid low CBG in am. On Tresiba(increased to 20u 06/25/21)  Novology coverage             Syncope, stopped Labetalol, no recurrence reported             HTN, takes Amlodipine, Lisinopril Bun/creat 14/0.9 06/05/21             Hypothyroidism, takes Levothyroxine, TSH 4.53 12/24/20. 4.26 05/29/21             Dementia, off Donepezil, SNF [facility] for supportive care             Hypernatremia, Na 146 06/05/21     Past Medical History:  Diagnosis Date   Aortic regurgitation    mild by echo 08/2019   Carotid artery stenosis    1-39% bilateral stenosis by dopplers 08/2019    Cataract    Complication of anesthesia    "high blood sugar; he's been loopy last 36h since OR"- 2016   Diabetes mellitus    DKA, type 2 ([place])    Hearing loss    Hypercholesteremia    Hypertension    Insulin pump in place    Macular degeneration    MCI (mild cognitive impairment) with memory loss 12/12/2014   OSA on CPAP 09/11/2014   PONV (postoperative nausea and vomiting)    Uses hearing aid    right ear, patient stated he lost left hearing aid    Past Surgical History:  Procedure Laterality Date   CHOLECYSTECTOMY N/A 10/28/2015   Procedure: LAPAROSCOPIC CHOLECYSTECTOMY WITH   INTRAOPERATIVE CHOLANGIOGRAM;  Surgeon: Greer Pickerel, MD;  Location: [facility];  Service: General;  Laterality: N/A;   SHOULDER ARTHROSCOPY Right 08/24/11   "frozen shoulder; RCR w/labrum tear; arthritis scraping; bone spurs"   SHOULDER ARTHROSCOPY Left ~ 2006   TONSILLECTOMY  1949   "in childhood"    No Known Allergies  Allergies as of 07/18/2021   No Known Allergies      Medication List        Accurate as of July 18, 2021 11:59  PM. If you have any questions, ask your nurse or doctor.          amLODipine 10 MG tablet Commonly known as: NORVASC Take 10 mg by mouth daily.   aspirin EC 81 MG tablet Take 81 mg by mouth daily.   insulin aspart 100 UNIT/ML injection Commonly known as: novoLOG Inject 6-12 Units into the skin 3 (three) times daily with meals. If Blood Sugar is 150 to 200, give 6 Units. If Blood Sugar is 201 to 250, give 8 Units. If Blood Sugar is 251 to 300, give 10 Units. If Blood Sugar is 301 to 450, give 12 Units. If Blood Sugar is greater than 450, call MD.   levothyroxine 25 MCG tablet Commonly known as: SYNTHROID Take 25 mcg by mouth daily before breakfast.   lisinopril 30 MG tablet Commonly known as: ZESTRIL Take 30 mg by mouth daily.   nystatin powder Commonly known as: MYCOSTATIN/NYSTOP Apply 1 application topically 3 (three) times daily. Apply to right and left  buttocks with incontinent care every shift   nystatin-triamcinolone cream Commonly known as: MYCOLOG II Apply 1 application topically 2 (two) times daily.   [name] FlexTouch 100 UNIT/ML FlexTouch Pen Generic drug: insulin degludec Inject 20 Units into the skin in the morning. Per sliding scale        Review of Systems  Unable to perform ROS: Dementia   Immunization History  Administered Date(s) Administered   DTaP, 5 pertussis antigens 09/07/2016   Influenza Split 05/09/2010, 03/02/2011, 03/21/2012, 04/06/2013, 03/26/2014, 04/03/2020   Influenza, High Dose Seasonal PF 04/06/2013, 04/02/2016, 03/31/2017   Influenza, Quadrivalent, Recombinant, Inj, Pf 03/14/2018, 03/06/2019, 05/15/2020   Influenza,inj,Quad PF,6+ Mos 05/01/2015   Influenza-Unspecified 04/09/2021   Moderna SARS-COV2 Booster Vaccination 04/30/2020, 11/19/2020   Moderna Sars-Covid-2 Vaccination 06/26/2019, 07/24/2019   PFIZER(Purple Top)SARS-COV-2 Vaccination 04/09/2021   Pneumococcal Conjugate-13 01/31/2014, 08/22/2015   Pneumococcal Polysaccharide-23 05/21/2008, 05/23/2009   Td 05/18/2007, 05/23/2009   Tdap 03/03/2012   Zoster, Live 05/21/2008, 05/23/2009   Pertinent  Health Maintenance Due  Topic Date Due   FOOT EXAM  Never done   OPHTHALMOLOGY EXAM  Never done   COLONOSCOPY (Pts 45-77yrs Insurance coverage will need to be confirmed)  06/18/2022 (Originally 04/13/2019)   HEMOGLOBIN A1C  11/27/2021   INFLUENZA VACCINE  Completed   Fall Risk 07/25/2019 08/28/2019 07/20/2020 07/21/2020 07/21/2020  Patient Fall Risk Level Moderate fall risk High fall risk High fall risk High fall risk High fall risk   Functional Status Survey:    Vitals:   07/18/21 1528  BP: 136/90  Pulse: 77  Resp: 18  Temp: 97.6 F (36.4 C)  SpO2: 96%   There is no height or weight on file to calculate BMI. Physical Exam Vitals and nursing note reviewed.  Constitutional:      Appearance: Normal appearance.  HENT:     Head:  Normocephalic and atraumatic.     Mouth/Throat:     Mouth: Mucous membranes are moist.  Eyes:     Extraocular Movements: Extraocular movements intact.     Conjunctiva/sclera: Conjunctivae normal.     Pupils: Pupils are equal, round, and reactive to light.  Cardiovascular:     Rate and Rhythm: Normal rate and regular rhythm.     Heart sounds: No murmur heard. Pulmonary:     Effort: Pulmonary effort is normal.     Breath sounds: No rales.  Abdominal:     General: Bowel sounds are normal.     Palpations: Abdomen is soft.  Musculoskeletal:     Cervical back: Normal range of motion and neck supple.     Right lower leg: No edema.     Left lower leg: No edema.  Skin:    General: Skin is warm and dry.     Comments: Dark red discoloration of R+L buttocks and sacrum. Scattered cherry angioma chest, abd, back, BLE R>L, angioma.   Neurological:     General: No focal deficit present.     Mental Status: He is alert.     Motor: No weakness.     Coordination: Coordination normal.     Gait: Gait abnormal.     Comments: Oriented to self.   Psychiatric:     Comments: Alert, smiled.     Labs reviewed: Recent Labs    08/02/20 1412 09/13/20 0000 12/25/20 0000 03/11/21 0000 05/29/21 0000 06/05/21 0000  NA 149* 137   < > 140 148* 146  K 4.0 4.4   < > 4.5 3.6 3.7  CL 110* 102   < > 107 112* 110*  CO2 23 22   < > 27* 24* 30*  GLUCOSE 246*  --   --   --   --   --   BUN 18 13   < > 14 17 14   CREATININE 1.14 1.1   < > 1.0 0.9 0.9  CALCIUM 9.2 8.6*  --  8.8 8.9  --    < > = values in this interval not displayed.   Recent Labs    09/13/20 0000 05/29/21 0000  AST 19 11*  ALT 15 8*  ALKPHOS 96 88  ALBUMIN 3.4* 3.3*   Recent Labs    09/13/20 0000 12/25/20 0000 05/29/21 0000  WBC 18.2 4.9 5.6  NEUTROABS 15,252.00  --  3,584.00  HGB 15.9 13.3* 13.7  HCT 47 40* 40*  PLT 252 175 182   Lab Results  Component Value Date   TSH 4.26 05/29/2021   Lab Results  Component Value Date    HGBA1C 7.8 05/29/2021   No results found for: CHOL, HDL, LDLCALC, LDLDIRECT, TRIG, CHOLHDL  Significant Diagnostic Results in last 30 days:  No results found.  Assessment/Plan: Type 2 diabetes mellitus with vascular disease ([place]) Hgb a1c 7.3 12/24/20<<7.8 05/29/21, hs snack to avoid low CBG in am. On Tresiba(increased to 20u 06/25/21)  Novology coverage. Assist the patient with meals and snacks. Observe.   HTN (hypertension) Blood pressure is controlled,  takes Amlodipine, Lisinopril Bun/creat 14/0.9 06/05/21  Hypothyroidism  takes Levothyroxine, TSH 4.53 12/24/20. 4.26 05/29/21  Vascular dementia ([place]) off Donepezil, SNF [facility] for supportive care    Family/ staff Communication: plan of care reviewed with the patient and charge nurse.   Labs/tests ordered:  none  Time spend 25 minutes.

## 2021-07-18 NOTE — Assessment & Plan Note (Signed)
Blood pressure is controlled,  takes Amlodipine, Lisinopril Bun/creat 14/0.9 06/05/21

## 2021-07-18 NOTE — Assessment & Plan Note (Signed)
takes Levothyroxine, TSH 4.53 12/24/20. 4.26 05/29/21

## 2021-07-21 ENCOUNTER — Encounter: Payer: Self-pay | Admitting: Nurse Practitioner

## 2021-08-11 ENCOUNTER — Non-Acute Institutional Stay (SKILLED_NURSING_FACILITY): Payer: Medicare PPO | Admitting: Nurse Practitioner

## 2021-08-11 DIAGNOSIS — E1159 Type 2 diabetes mellitus with other circulatory complications: Secondary | ICD-10-CM

## 2021-08-11 DIAGNOSIS — R55 Syncope and collapse: Secondary | ICD-10-CM | POA: Diagnosis not present

## 2021-08-11 DIAGNOSIS — F01C0 Vascular dementia, severe, without behavioral disturbance, psychotic disturbance, mood disturbance, and anxiety: Secondary | ICD-10-CM

## 2021-08-11 DIAGNOSIS — R627 Adult failure to thrive: Secondary | ICD-10-CM

## 2021-08-11 DIAGNOSIS — E870 Hyperosmolality and hypernatremia: Secondary | ICD-10-CM

## 2021-08-11 DIAGNOSIS — I10 Essential (primary) hypertension: Secondary | ICD-10-CM

## 2021-08-11 DIAGNOSIS — E039 Hypothyroidism, unspecified: Secondary | ICD-10-CM

## 2021-08-12 ENCOUNTER — Encounter: Payer: Self-pay | Admitting: Nurse Practitioner

## 2021-08-12 DIAGNOSIS — R627 Adult failure to thrive: Secondary | ICD-10-CM | POA: Insufficient documentation

## 2021-08-12 NOTE — Assessment & Plan Note (Signed)
off Donepezil, SNF [facility] for supportive care  

## 2021-08-12 NOTE — Progress Notes (Signed)
Location:   [facility] Room Number: 27 Place of Service:  SNF (31) Provider:  Marlana Latus NP  Ercell Razon X, NP  Patient Care Team: Helem Reesor X, NP as PCP - General (Internal Medicine) [name], MD as PCP - Cardiology (Cardiology) [name], MD as Consulting Physician (Cardiology)  Extended Emergency Contact Information Primary Emergency Contact: Govoni,[name] Address: [address]          [place], [place] [address] [name] Phone: [phone] Mobile Phone: [phone] Relation: Spouse  Code Status:  DNR Managed Care Goals of care: Advanced Directive information Advanced Directives 08/12/2021  Does Patient Have a Medical Advance Directive? Yes  Type of Advance Directive Out of facility DNR (pink MOST or yellow form)  Does patient want to make changes to medical advance directive? No - Patient declined  Copy of [place] in Chart? -  Would patient like information on creating a medical advance directive? -  Pre-existing out of facility DNR order (yellow form or pink MOST form) Pink MOST form placed in chart (order not valid for inpatient use)     Chief Complaint  Patient presents with   Medical Management of Chronic Issues   Quality Metric Gaps    FOOT EXAM (Yearly)   OPHTHALMOLOGY EXAM (Yearly)  Zoster Vaccines- Shingrix     HPI:  Pt is a 81 y.o. male seen today for medical management of chronic diseases.    Adult Failure to Thrive, weight loss #4Ibs in the past month. Hgb 12.7 07/06/21   T2DM Hgb a1c 7.3 12/24/20<<7.8 05/29/21, hs snack to avoid low CBG in am. On Tresiba(increased to 20u 06/25/21)  Novology coverage             Syncope, stopped Labetalol, no recurrence reported             HTN, takes Amlodipine, Lisinopril Bun/creat 14/0.9 06/05/21             Hypothyroidism, takes Levothyroxine, TSH 4.53 12/24/20. 4.26 05/29/21             Dementia, off Donepezil, SNF [facility] for supportive  care             Hypernatremia, Na 146 06/05/21  Past Medical History:  Diagnosis Date   Aortic regurgitation    mild by echo 08/2019   Carotid artery stenosis    1-39% bilateral stenosis by dopplers 08/2019   Cataract    Complication of anesthesia    "high blood sugar; he's been loopy last 36h since OR"- 2016   Diabetes mellitus    DKA, type 2 ([place])    Hearing loss    Hypercholesteremia    Hypertension    Insulin pump in place    Macular degeneration    MCI (mild cognitive impairment) with memory loss 12/12/2014   OSA on CPAP 09/11/2014   PONV (postoperative nausea and vomiting)    Uses hearing aid    right ear, patient stated he lost left hearing aid    Past Surgical History:  Procedure Laterality Date   CHOLECYSTECTOMY N/A 10/28/2015   Procedure: LAPAROSCOPIC CHOLECYSTECTOMY WITH   INTRAOPERATIVE CHOLANGIOGRAM;  Surgeon: Greer Pickerel, MD;  Location: [facility];  Service: General;  Laterality: N/A;   SHOULDER ARTHROSCOPY Right 08/24/11   "frozen shoulder; RCR w/labrum tear; arthritis scraping; bone spurs"   SHOULDER ARTHROSCOPY Left ~ 2006   TONSILLECTOMY  1949   "in childhood"    No  Known Allergies  Allergies as of 08/11/2021   No Known Allergies      Medication List        Accurate as of August 11, 2021 11:59 PM. If you have any questions, ask your nurse or doctor.          amLODipine 10 MG tablet Commonly known as: NORVASC Take 10 mg by mouth daily.   aspirin EC 81 MG tablet Take 81 mg by mouth daily.   insulin aspart 100 UNIT/ML injection Commonly known as: novoLOG Inject 6-12 Units into the skin 3 (three) times daily with meals. If Blood Sugar is 150 to 200, give 6 Units. If Blood Sugar is 201 to 250, give 8 Units. If Blood Sugar is 251 to 300, give 10 Units. If Blood Sugar is 301 to 450, give 12 Units. If Blood Sugar is greater than 450, call MD.   levothyroxine 25 MCG tablet Commonly known as: SYNTHROID Take 25 mcg by mouth daily before breakfast.    lisinopril 30 MG tablet Commonly known as: ZESTRIL Take 30 mg by mouth daily.   nystatin powder Commonly known as: MYCOSTATIN/NYSTOP Apply 1 application topically 3 (three) times daily. Apply to right and left buttocks with incontinent care every shift   nystatin-triamcinolone cream Commonly known as: MYCOLOG II Apply 1 application topically 2 (two) times daily.   [name] FlexTouch 100 UNIT/ML FlexTouch Pen Generic drug: insulin degludec Inject 20 Units into the skin in the morning. Per sliding scale        Review of Systems  Unable to perform ROS: Dementia   Immunization History  Administered Date(s) Administered   DTaP, 5 pertussis antigens 09/07/2016   Influenza Split 05/09/2010, 03/02/2011, 03/21/2012, 04/06/2013, 03/26/2014, 04/03/2020   Influenza, High Dose Seasonal PF 04/06/2013, 04/02/2016, 03/31/2017   Influenza, Quadrivalent, Recombinant, Inj, Pf 03/14/2018, 03/06/2019, 05/15/2020   Influenza,inj,Quad PF,6+ Mos 05/01/2015   Influenza-Unspecified 04/09/2021   Moderna SARS-COV2 Booster Vaccination 04/30/2020, 11/19/2020   Moderna Sars-Covid-2 Vaccination 06/26/2019, 07/24/2019   PFIZER(Purple Top)SARS-COV-2 Vaccination 04/09/2021   Pneumococcal Conjugate-13 01/31/2014, 08/22/2015   Pneumococcal Polysaccharide-23 05/21/2008, 05/23/2009   Td 05/18/2007, 05/23/2009   Tdap 03/03/2012   Zoster, Live 05/21/2008, 05/23/2009   Pertinent  Health Maintenance Due  Topic Date Due   FOOT EXAM  Never done   OPHTHALMOLOGY EXAM  Never done   COLONOSCOPY (Pts 45-58yrs Insurance coverage will need to be confirmed)  06/18/2022 (Originally 04/13/2019)   HEMOGLOBIN A1C  11/27/2021   INFLUENZA VACCINE  Completed   Fall Risk 07/25/2019 08/28/2019 07/20/2020 07/21/2020 07/21/2020  Patient Fall Risk Level Moderate fall risk High fall risk High fall risk High fall risk High fall risk   Functional Status Survey:    Vitals:   08/12/21 1121  BP: (!) 149/76  Pulse: 69  Resp: 17  Temp:  97.8 F (36.6 C)  SpO2: 95%  Weight: 155 lb 11.2 oz (70.6 kg)  Height: 5\' 5"  (1.651 m)   Body mass index is 25.91 kg/m. Physical Exam Vitals and nursing note reviewed.  Constitutional:      Appearance: Normal appearance.  HENT:     Head: Normocephalic and atraumatic.     Mouth/Throat:     Mouth: Mucous membranes are moist.  Eyes:     Extraocular Movements: Extraocular movements intact.     Conjunctiva/sclera: Conjunctivae normal.     Pupils: Pupils are equal, round, and reactive to light.  Cardiovascular:     Rate and Rhythm: Normal rate and regular rhythm.  Heart sounds: No murmur heard. Pulmonary:     Effort: Pulmonary effort is normal.     Breath sounds: No rales.  Abdominal:     General: Bowel sounds are normal.     Palpations: Abdomen is soft.  Musculoskeletal:     Cervical back: Normal range of motion and neck supple.     Right lower leg: No edema.     Left lower leg: No edema.  Skin:    General: Skin is warm and dry.     Comments: Dark red discoloration of R+L buttocks and sacrum. Scattered cherry angioma chest, abd, back, BLE R>L, angioma.   Neurological:     General: No focal deficit present.     Mental Status: He is alert.     Motor: No weakness.     Coordination: Coordination normal.     Gait: Gait abnormal.     Comments: Oriented to self.   Psychiatric:     Comments: Alert, smiled.     Labs reviewed: Recent Labs    09/13/20 0000 12/25/20 0000 03/11/21 0000 05/29/21 0000 06/05/21 0000  NA 137   < > 140 148* 146  K 4.4   < > 4.5 3.6 3.7  CL 102   < > 107 112* 110*  CO2 22   < > 27* 24* 30*  BUN 13   < > 14 17 14   CREATININE 1.1   < > 1.0 0.9 0.9  CALCIUM 8.6*  --  8.8 8.9  --    < > = values in this interval not displayed.   Recent Labs    09/13/20 0000 05/29/21 0000  AST 19 11*  ALT 15 8*  ALKPHOS 96 88  ALBUMIN 3.4* 3.3*   Recent Labs    09/13/20 0000 12/25/20 0000 05/29/21 0000 06/26/21 0000  WBC 18.2 4.9 5.6 7.2   NEUTROABS 15,252.00  --  3,584.00 3,708.00  HGB 15.9 13.3* 13.7 12.7*  HCT 47 40* 40* 38*  PLT 252 175 182 215   Lab Results  Component Value Date   TSH 4.26 05/29/2021   Lab Results  Component Value Date   HGBA1C 7.8 05/29/2021   No results found for: CHOL, HDL, LDLCALC, LDLDIRECT, TRIG, CHOLHDL  Significant Diagnostic Results in last 30 days:  No results found.  Assessment/Plan Adult failure to thrive  weight loss #4Ibs in the past month. Hgb 12.7 07/06/21. Continue supportive care, f/u dietary recommendation.   Type 2 diabetes mellitus with vascular disease ([facility]) Hgb a1c 7.3 12/24/20<<7.8 05/29/21, hs snack to avoid low CBG in am. On Tresiba(increased to 20u 06/25/21)  Novology coverage  Syncope with normal neurologic examination no recurrence reported  HTN (hypertension) Blood pressure is controlled, takes Amlodipine, Lisinopril Bun/creat 14/0.9 06/05/21  Hypothyroidism  takes Levothyroxine, TSH 4.53 12/24/20. 4.26 05/29/21  Vascular dementia ([place]) off Donepezil, SNF [facility] for supportive care  Hypernatremia Na 146 06/05/21, encourage oral fluid intake.     Family/ staff Communication: plan of care reviewed with the patient and charge nurse.   Labs/tests ordered:  none  Time spend 25 minutes.

## 2021-08-12 NOTE — Assessment & Plan Note (Signed)
no recurrence reported

## 2021-08-12 NOTE — Assessment & Plan Note (Signed)
weight loss #4Ibs in the past month. Hgb 12.7 07/06/21. Continue supportive care, f/u dietary recommendation.

## 2021-08-12 NOTE — Assessment & Plan Note (Signed)
takes Levothyroxine, TSH 4.53 12/24/20. 4.26 05/29/21

## 2021-08-12 NOTE — Assessment & Plan Note (Signed)
Hgb a1c 7.3 12/24/20<<7.8 05/29/21, hs snack to avoid low CBG in am. On Tresiba(increased to 20u 06/25/21)  Novology coverage

## 2021-08-12 NOTE — Assessment & Plan Note (Signed)
Blood pressure is controlled, takes Amlodipine, Lisinopril Bun/creat 14/0.9 06/05/21

## 2021-08-12 NOTE — Assessment & Plan Note (Signed)
Na 146 06/05/21, encourage oral fluid intake.

## 2021-08-14 ENCOUNTER — Encounter: Payer: Self-pay | Admitting: Nurse Practitioner

## 2021-08-26 NOTE — Progress Notes (Signed)
[facility] COMMUNITY PALLIATIVE CARE RN NOTE  PATIENT NAME: CINDY BRINDISI DOB: Dec 15, 1940 MRN: [id number]  PRIMARY CARE PROVIDER: Mast, Man X, NP  RESPONSIBLE PARTY: Florestine Avers (wife) Acct ID - Guarantor Home Phone Work Phone Relationship Acct Type  [id number] ALYX, [phone] Self P/F     [place], [place], [place] [address]    Covid-19 Pre-screening Negative  PLAN OF CARE and INTERVENTION:  ADVANCE CARE PLANNING/GOALS OF CARE: Goal is for patient to remain at current facility, [facility]. Patient has a DNR. PATIENT/CAREGIVER EDUCATION: Explained palliative care services, symptom management, safe mobility/transfers, s/s of infection DISEASE STATUS: RN visit made for new palliative care referral. Met with patient and his wife-[name] in patient's room. Patient just recently discharged from hospice services for a greater than 6 month prognosis. He is awake and alert, lying in bed. He is verbal, but intermittently confused. At times he is slow to respond or may not answer questions at all. Nonsensical conversation at times. No physical indicators of pain noted. He is dependent with ADLs. He is non-ambulatory. He requires 1-2 person assistance with standing and transfers He is incontinent of both bowel and bladder. His appetite is fair. Staff reports at times he can feed himself and at other times he requires staff assistance. Wife is agreeable to future visits with palliative care. Will continue to follow.  HISTORY OF PRESENT ILLNESS:  This is a 81 yo male with a diagnosis of vascular dementia. He has a history of aortic regurgitation, carotid artery stenosis, DM II, and macular degeneration. Palliative care team has been asked to follow patient for goals of care, assist with symptom management and complex decision making.   CODE STATUS: DNR ADVANCED DIRECTIVES: Y MOST FORM: no PPS: 30%   PHYSICAL EXAM:   LUNGS: clear to  auscultation  CARDIAC: Cor RRR EXTREMITIES: No edema SKIN:  Exposed skin is dry and intact; has an air mattress and gel cushion   NEURO:  Alert and oriented to self, intermittent confusion, generalized weakness, non-ambulatory   (Duration of visit and documentation 45 minutes)    Daryl Eastern, RN BSN

## 2021-09-02 ENCOUNTER — Non-Acute Institutional Stay (SKILLED_NURSING_FACILITY): Payer: Medicare PPO | Admitting: Internal Medicine

## 2021-09-02 ENCOUNTER — Encounter: Payer: Self-pay | Admitting: Internal Medicine

## 2021-09-02 DIAGNOSIS — F01C0 Vascular dementia, severe, without behavioral disturbance, psychotic disturbance, mood disturbance, and anxiety: Secondary | ICD-10-CM | POA: Diagnosis not present

## 2021-09-02 DIAGNOSIS — E1159 Type 2 diabetes mellitus with other circulatory complications: Secondary | ICD-10-CM | POA: Diagnosis not present

## 2021-09-02 DIAGNOSIS — I10 Essential (primary) hypertension: Secondary | ICD-10-CM

## 2021-09-02 DIAGNOSIS — E039 Hypothyroidism, unspecified: Secondary | ICD-10-CM

## 2021-09-02 NOTE — Progress Notes (Signed)
?Location:   [facility] ?Nursing Home Room Number: 52 ?Place of Service:  SNF (31) ?Provider:  Veleta Miners MD ? ?Mast, Man X, NP ? ?Patient Care Team: ?Mast, Man X, NP as PCP - General (Internal Medicine) ?[name], MD as PCP - Cardiology (Cardiology) ?[name], MD as Consulting Physician (Cardiology) ? ?Extended Emergency Contact Information ?Primary Emergency Contact: Evetts,[name] ?Address: [address] [phone] ?         [place], [place] [address] [place] ?Home Phone: [phone] ?Mobile Phone: [phone] ?Relation: Spouse ? ?Code Status:  DNR Managed Care ?Goals of care: Advanced Directive information ?Advanced Directives 09/02/2021  ?Does Patient Have a Medical Advance Directive? Yes  ?Type of Advance Directive Out of facility DNR (pink MOST or yellow form)  ?Does patient want to make changes to medical advance directive? No - Patient declined  ?Copy of [place] in Chart? -  ?Would patient like information on creating a medical advance directive? -  ?Pre-existing out of facility DNR order (yellow form or pink MOST form) Pink MOST form placed in chart (order not valid for inpatient use)  ? ? ? ?Chief Complaint  ?Patient presents with  ? Medical Management of Chronic Issues  ? Quality Metric Gaps  ?  Verified [facility] and [facility] patient is due for: ?FOOT EXAM (Yearly)   ?OPHTHALMOLOGY EXAM (Yearly) ?Zoster Vaccines- Shingrix ? ?  ? ? ?HPI:  ?Pt is a 81 y.o. male seen today for medical management of chronic diseases.   ? ?Patient has a history of diabetes type 2, hypertension, HLD, carotid stenosis history of OSA on CPAP ?Also has a history of unexplained syncope.s/p ILR ?Patient also has a history of dementia ? ? Lives in SNF ?Discharged from hospice as weight stabilized ?Continues to have aphasia ?Non Ambulatory ? No new Nursing issues. No Behavior issues ?His weight is stable ?No Falls ?Wt Readings from Last 3 Encounters:  ?09/02/21 159 lb 9.6 oz  (72.4 kg)  ?08/12/21 155 lb 11.2 oz (70.6 kg)  ?07/04/21 159 lb 6.4 oz (72.3 kg)  ?CBGs running from 70 -400 ?Unable to give history ? ?Past Medical History:  ?Diagnosis Date  ? Aortic regurgitation   ? mild by echo 08/2019  ? Carotid artery stenosis   ? 1-39% bilateral stenosis by dopplers 08/2019  ? Cataract   ? Complication of anesthesia   ? "high blood sugar; he's been loopy last 36h since OR"- 2016  ? Diabetes mellitus   ? DKA, type 2 ([place])   ? Hearing loss   ? Hypercholesteremia   ? Hypertension   ? Insulin pump in place   ? Macular degeneration   ? MCI (mild cognitive impairment) with memory loss 12/12/2014  ? OSA on CPAP 09/11/2014  ? PONV (postoperative nausea and vomiting)   ? Uses hearing aid   ? right ear, patient stated he lost left hearing aid   ? ?Past Surgical History:  ?Procedure Laterality Date  ? CHOLECYSTECTOMY N/A 10/28/2015  ? Procedure: LAPAROSCOPIC CHOLECYSTECTOMY WITH   INTRAOPERATIVE CHOLANGIOGRAM;  Surgeon: Greer Pickerel, MD;  Location: [facility];  Service: General;  Laterality: N/A;  ? SHOULDER ARTHROSCOPY Right 08/24/11  ? "frozen shoulder; RCR w/labrum tear; arthritis scraping; bone spurs"  ? SHOULDER ARTHROSCOPY Left ~ 2006  ? TONSILLECTOMY  1949  ? "in childhood"  ? ? ?No Known Allergies ? ?Allergies as of 09/02/2021   ?No Known Allergies ?  ? ?  ?Medication List  ?  ? ?  ?  Accurate as of September 02, 2021 11:54 AM. If you have any questions, ask your nurse or doctor.  ?  ?  ? ?  ? ?amLODipine 10 MG tablet ?Commonly known as: NORVASC ?Take 10 mg by mouth daily. ?  ?aspirin EC 81 MG tablet ?Take 81 mg by mouth daily. ?  ?insulin aspart 100 UNIT/ML injection ?Commonly known as: novoLOG ?Inject 6-12 Units into the skin 3 (three) times daily with meals. If Blood Sugar is 150 to 200, give 6 Units. ?If Blood Sugar is 201 to 250, give 8 Units. ?If Blood Sugar is 251 to 300, give 10 Units. ?If Blood Sugar is 301 to 450, give 12 Units. ?If Blood Sugar is greater than 450, call MD. ?  ?levothyroxine 25 MCG  tablet ?Commonly known as: SYNTHROID ?Take 25 mcg by mouth daily before breakfast. ?  ?lisinopril 30 MG tablet ?Commonly known as: ZESTRIL ?Take 30 mg by mouth daily. ?  ?nystatin powder ?Commonly known as: MYCOSTATIN/NYSTOP ?Apply 1 application topically 3 (three) times daily. Apply to right and left buttocks with incontinent care every shift ?  ?nystatin-triamcinolone cream ?Commonly known as: MYCOLOG II ?Apply 1 application topically 2 (two) times daily. ?  ?[name] FlexTouch 100 UNIT/ML FlexTouch Pen ?Generic drug: insulin degludec ?Inject 20 Units into the skin in the morning. Per sliding scale ?  ? ?  ? ? ?Review of Systems  ?Unable to perform ROS: Dementia  ? ?Immunization History  ?Administered Date(s) Administered  ? DTaP, 5 pertussis antigens 09/07/2016  ? Influenza Split 05/09/2010, 03/02/2011, 03/21/2012, 04/06/2013, 03/26/2014, 04/03/2020  ? Influenza, High Dose Seasonal PF 04/06/2013, 04/02/2016, 03/31/2017  ? Influenza, Quadrivalent, Recombinant, Inj, Pf 03/14/2018, 03/06/2019, 05/15/2020  ? Influenza,inj,Quad PF,6+ Mos 05/01/2015  ? Influenza-Unspecified 04/09/2021  ? Moderna SARS-COV2 Booster Vaccination 04/30/2020, 11/19/2020  ? Moderna Sars-Covid-2 Vaccination 06/26/2019, 07/24/2019  ? PFIZER(Purple Top)SARS-COV-2 Vaccination 04/09/2021  ? Pneumococcal Conjugate-13 01/31/2014, 08/22/2015  ? Pneumococcal Polysaccharide-23 05/21/2008, 05/23/2009  ? Td 05/18/2007, 05/23/2009  ? Tdap 03/03/2012  ? Zoster, Live 05/21/2008, 05/23/2009  ? ?Pertinent  Health Maintenance Due  ?Topic Date Due  ? FOOT EXAM  Never done  ? OPHTHALMOLOGY EXAM  Never done  ? COLONOSCOPY (Pts 45-7yr Insurance coverage will need to be confirmed)  06/18/2022 (Originally 04/13/2019)  ? HEMOGLOBIN A1C  11/27/2021  ? INFLUENZA VACCINE  Completed  ? ?Fall Risk 07/25/2019 08/28/2019 07/20/2020 07/21/2020 07/21/2020  ?Patient Fall Risk Level Moderate fall risk High fall risk High fall risk High fall risk High fall risk  ? ?Functional Status  Survey: ?  ? ?Vitals:  ? 09/02/21 1151  ?BP: (!) 152/81  ?Pulse: 72  ?Resp: 17  ?Temp: (!) 97.5 ?F (36.4 ?C)  ?SpO2: 95%  ?Weight: 159 lb 9.6 oz (72.4 kg)  ?Height: '5\' 5"'$  (1.651 m)  ? ?Body mass index is 26.56 kg/m?.Marland Kitchen?Physical Exam ?Vitals reviewed.  ?Constitutional:   ?   Appearance: Normal appearance.  ?HENT:  ?   Head: Normocephalic.  ?   Mouth/Throat:  ?   Mouth: Mucous membranes are moist.  ?   Pharynx: Oropharynx is clear.  ?Eyes:  ?   Pupils: Pupils are equal, round, and reactive to light.  ?Cardiovascular:  ?   Rate and Rhythm: Normal rate and regular rhythm.  ?   Pulses: Normal pulses.  ?   Heart sounds: No murmur heard. ?Pulmonary:  ?   Effort: Pulmonary effort is normal. No respiratory distress.  ?   Breath sounds: Normal breath sounds. No rales.  ?  Abdominal:  ?   General: Abdomen is flat. Bowel sounds are normal.  ?   Palpations: Abdomen is soft.  ?Musculoskeletal:     ?   General: No swelling.  ?   Cervical back: Neck supple.  ?Skin: ?   General: Skin is warm.  ?Neurological:  ?   General: No focal deficit present.  ?   Mental Status: He is alert.  ?   Comments: Has aphasia ?Respond to his name but does not follow any commands  ?Stays in his [place] chair  ?Psychiatric:     ?   Mood and Affect: Mood normal.     ?   Thought Content: Thought content normal.  ? ? ?Labs reviewed: ?Recent Labs  ?  09/13/20 ?0000 12/25/20 ?0000 03/11/21 ?0000 05/29/21 ?0000 06/05/21 ?0000  ?NA 137   < > 140 148* 146  ?K 4.4   < > 4.5 3.6 3.7  ?CL 102   < > 107 112* 110*  ?CO2 22   < > 27* 24* 30*  ?BUN 13   < > '14 17 14  '$ ?CREATININE 1.1   < > 1.0 0.9 0.9  ?CALCIUM 8.6*  --  8.8 8.9  --   ? < > = values in this interval not displayed.  ? ?Recent Labs  ?  09/13/20 ?0000 05/29/21 ?0000  ?AST 19 11*  ?ALT 15 8*  ?ALKPHOS 96 88  ?ALBUMIN 3.4* 3.3*  ? ?Recent Labs  ?  09/13/20 ?0000 12/25/20 ?0000 05/29/21 ?0000 06/26/21 ?0000  ?WBC 18.2 4.9 5.6 7.2  ?NEUTROABS 15,252.00  --  3,584.00 3,708.00  ?HGB 15.9 13.3* 13.7 12.7*  ?HCT 47 40*  40* 38*  ?PLT 252 175 182 215  ? ?Lab Results  ?Component Value Date  ? TSH 4.26 05/29/2021  ? ?Lab Results  ?Component Value Date  ? HGBA1C 7.8 05/29/2021  ? ?No results found for: CHOL, HDL, LDLCALC, LDLDIRE

## 2021-09-09 DIAGNOSIS — L84 Corns and callosities: Secondary | ICD-10-CM | POA: Diagnosis not present

## 2021-09-09 DIAGNOSIS — E1159 Type 2 diabetes mellitus with other circulatory complications: Secondary | ICD-10-CM | POA: Diagnosis not present

## 2021-09-09 DIAGNOSIS — B351 Tinea unguium: Secondary | ICD-10-CM | POA: Diagnosis not present

## 2021-09-22 ENCOUNTER — Encounter: Payer: Self-pay | Admitting: Nurse Practitioner

## 2021-09-22 ENCOUNTER — Non-Acute Institutional Stay (SKILLED_NURSING_FACILITY): Payer: Medicare PPO | Admitting: Nurse Practitioner

## 2021-09-22 DIAGNOSIS — I10 Essential (primary) hypertension: Secondary | ICD-10-CM

## 2021-09-22 DIAGNOSIS — F01C0 Vascular dementia, severe, without behavioral disturbance, psychotic disturbance, mood disturbance, and anxiety: Secondary | ICD-10-CM | POA: Diagnosis not present

## 2021-09-22 DIAGNOSIS — E1159 Type 2 diabetes mellitus with other circulatory complications: Secondary | ICD-10-CM

## 2021-09-22 DIAGNOSIS — R627 Adult failure to thrive: Secondary | ICD-10-CM | POA: Diagnosis not present

## 2021-09-22 DIAGNOSIS — E039 Hypothyroidism, unspecified: Secondary | ICD-10-CM | POA: Diagnosis not present

## 2021-09-22 NOTE — Progress Notes (Signed)
?Location:   [facility] ?Nursing Home Room Number: 27A ?Place of Service:  SNF (31) ?Provider:  Megahn Killings X, NP ? ?Soriyah Osberg X, NP ? ?Patient Care Team: ?Kinser Fellman X, NP as PCP - General (Internal Medicine) ?[name], MD as PCP - Cardiology (Cardiology) ?[name], MD as Consulting Physician (Cardiology) ? ?Extended Emergency Contact Information ?Primary Emergency Contact: Brimley,[name] ?Address: [name] [phone] ?         [place], [place] [address] [place] ?Home Phone: [phone] ?Mobile Phone: [phone] ?Relation: Spouse ? ?Code Status:  DNR ?Goals of care: Advanced Directive information ? ?  09/22/2021  ?  9:25 AM  ?Advanced Directives  ?Does Patient Have a Medical Advance Directive? Yes  ?Type of Advance Directive Out of facility DNR (pink MOST or yellow form)  ?Does patient want to make changes to medical advance directive? No - Patient declined  ?Pre-existing out of facility DNR order (yellow form or pink MOST form) Pink MOST form placed in chart (order not valid for inpatient use);Yellow form placed in chart (order not valid for inpatient use)  ? ? ? ?Chief Complaint  ?Patient presents with  ? Medical Management of Chronic Issues  ?  Routine follow up visit.  ? Health Maintenance  ?  Foot exam,eye exam  ? Immunizations  ?  Shingrix vaccine  ? ? ?HPI:  ?Pt is a 81 y.o. male seen today for medical management of chronic diseases.   ? Adult Failure to Thrive, weight stable, dc'd from Hospice service, Hgb 12.7 07/06/21 ?             T2DM Hgb a1c 7.3 12/24/20<<7.8 05/29/21, hs snack to avoid low CBG in am. On Tresiba(decreased to 18u 09/12/21 to avoid low CBG in am)  Novology coverage ?            Syncope, stopped Labetalol, no recurrence reported ?            HTN, takes Amlodipine, Lisinopril Bun/creat 14/0.9 06/05/21 ?            Hypothyroidism, takes Levothyroxine, TSH 4.53 12/24/20. 4.26 05/29/21 ?            Dementia, off Donepezil, SNF [facility] for supportive care ?             Hypernatremia, Na 146 06/05/21 ? ? ?Past Medical History:  ?Diagnosis Date  ? Aortic regurgitation   ? mild by echo 08/2019  ? Carotid artery stenosis   ? 1-39% bilateral stenosis by dopplers 08/2019  ? Cataract   ? Complication of anesthesia   ? "high blood sugar; he's been loopy last 36h since OR"- 2016  ? Diabetes mellitus   ? DKA, type 2 ([place])   ? Hearing loss   ? Hypercholesteremia   ? Hypertension   ? Insulin pump in place   ? Macular degeneration   ? MCI (mild cognitive impairment) with memory loss 12/12/2014  ? OSA on CPAP 09/11/2014  ? PONV (postoperative nausea and vomiting)   ? Uses hearing aid   ? right ear, patient stated he lost left hearing aid   ? ?Past Surgical History:  ?Procedure Laterality Date  ? CHOLECYSTECTOMY N/A 10/28/2015  ? Procedure: LAPAROSCOPIC CHOLECYSTECTOMY WITH   INTRAOPERATIVE CHOLANGIOGRAM;  Surgeon: Greer Pickerel, MD;  Location: [facility];  Service: General;  Laterality: N/A;  ? SHOULDER ARTHROSCOPY Right 08/24/11  ? "frozen shoulder; RCR w/labrum tear; arthritis scraping; bone spurs"  ? SHOULDER  ARTHROSCOPY Left ~ 2006  ? TONSILLECTOMY  1949  ? "in childhood"  ? ? ?No Known Allergies ? ?Allergies as of 09/22/2021   ?No Known Allergies ?  ? ?  ?Medication List  ?  ? ?  ? Accurate as of September 22, 2021 11:59 PM. If you have any questions, ask your nurse or doctor.  ?  ?  ? ?  ? ?amLODipine 10 MG tablet ?Commonly known as: NORVASC ?Take 10 mg by mouth daily. ?  ?aspirin EC 81 MG tablet ?Take 81 mg by mouth daily. ?  ?insulin aspart 100 UNIT/ML injection ?Commonly known as: novoLOG ?Inject 6-12 Units into the skin 3 (three) times daily with meals. If Blood Sugar is 151 to 300 give 5 Units. ?301-450 10 units ?  ?levothyroxine 25 MCG tablet ?Commonly known as: SYNTHROID ?Take 25 mcg by mouth daily before breakfast. ?  ?lisinopril 30 MG tablet ?Commonly known as: ZESTRIL ?Take 30 mg by mouth daily. ?  ?nystatin powder ?Commonly known as: MYCOSTATIN/NYSTOP ?Apply 1 application topically 3 (three) times  daily. Apply to right and left buttocks with incontinent care every shift ?  ?nystatin-triamcinolone cream ?Commonly known as: MYCOLOG II ?Apply 1 application topically 2 (two) times daily. ?  ?[name] FlexTouch 100 UNIT/ML FlexTouch Pen ?Generic drug: insulin degludec ?Inject 18 Units into the skin in the morning. ?  ? ?  ? ? ?Review of Systems  ?Unable to perform ROS: Dementia  ? ?Immunization History  ?Administered Date(s) Administered  ? DTaP, 5 pertussis antigens 09/07/2016  ? Influenza Split 05/09/2010, 03/02/2011, 03/21/2012, 04/06/2013, 03/26/2014, 04/03/2020  ? Influenza, High Dose Seasonal PF 04/06/2013, 04/02/2016, 03/31/2017  ? Influenza, Quadrivalent, Recombinant, Inj, Pf 03/14/2018, 03/06/2019, 05/15/2020  ? Influenza,inj,Quad PF,6+ Mos 05/01/2015  ? Influenza-Unspecified 04/09/2021  ? Moderna SARS-COV2 Booster Vaccination 04/30/2020, 11/19/2020  ? Moderna Sars-Covid-2 Vaccination 06/26/2019, 07/24/2019  ? PFIZER(Purple Top)SARS-COV-2 Vaccination 04/09/2021  ? Pneumococcal Conjugate-13 01/31/2014, 08/22/2015  ? Pneumococcal Polysaccharide-23 05/21/2008, 05/23/2009  ? Td 05/18/2007, 05/23/2009  ? Tdap 03/03/2012  ? Zoster, Live 05/21/2008, 05/23/2009  ? ?Pertinent  Health Maintenance Due  ?Topic Date Due  ? OPHTHALMOLOGY EXAM  Never done  ? COLONOSCOPY (Pts 45-64yr Insurance coverage will need to be confirmed)  06/18/2022 (Originally 04/13/2019)  ? HEMOGLOBIN A1C  11/27/2021  ? INFLUENZA VACCINE  01/20/2022  ? FOOT EXAM  09/23/2022  ? ? ?  07/25/2019  ? 11:38 PM 08/28/2019  ?  1:47 PM 07/20/2020  ?  2:00 PM 07/21/2020  ?  4:00 AM 07/21/2020  ? 10:00 AM  ?Fall Risk  ?Patient Fall Risk Level Moderate fall risk High fall risk High fall risk High fall risk High fall risk  ? ?Functional Status Survey: ?  ? ?Vitals:  ? 09/22/21 0918  ?BP: 140/70  ?Pulse: 70  ?Resp: 20  ?Temp: (!) 97.4 ?F (36.3 ?C)  ?SpO2: 95%  ?Weight: 158 lb 12.8 oz (72 kg)  ?Height: '5\' 5"'$  (1.651 m)  ? ?Body mass index is 26.43 kg/m?.Marland Kitchen?Physical  Exam ?Vitals and nursing note reviewed.  ?Constitutional:   ?   Appearance: Normal appearance.  ?HENT:  ?   Head: Normocephalic and atraumatic.  ?   Mouth/Throat:  ?   Mouth: Mucous membranes are moist.  ?Eyes:  ?   Extraocular Movements: Extraocular movements intact.  ?   Conjunctiva/sclera: Conjunctivae normal.  ?   Pupils: Pupils are equal, round, and reactive to light.  ?Cardiovascular:  ?   Rate and Rhythm: Normal rate and regular  rhythm.  ?   Heart sounds: No murmur heard. ?Pulmonary:  ?   Effort: Pulmonary effort is normal.  ?   Breath sounds: No rales.  ?Abdominal:  ?   General: Bowel sounds are normal.  ?   Palpations: Abdomen is soft.  ?Musculoskeletal:  ?   Cervical back: Normal range of motion and neck supple.  ?   Right lower leg: No edema.  ?   Left lower leg: No edema.  ?Skin: ?   General: Skin is warm and dry.  ?   Comments: Dark red discoloration of R+L buttocks and sacrum. Scattered cherry angioma chest, abd, back, BLE R>L, angioma. ?  ?Neurological:  ?   General: No focal deficit present.  ?   Mental Status: He is alert.  ?   Motor: No weakness.  ?   Coordination: Coordination normal.  ?   Gait: Gait abnormal.  ?   Comments: Oriented to self.   ?Psychiatric:  ?   Comments: Alert, smiled.   ? ? ?Labs reviewed: ?Recent Labs  ?  03/11/21 ?0000 05/29/21 ?0000 06/05/21 ?0000  ?NA 140 148* 146  ?K 4.5 3.6 3.7  ?CL 107 112* 110*  ?CO2 27* 24* 30*  ?BUN '14 17 14  '$ ?CREATININE 1.0 0.9 0.9  ?CALCIUM 8.8 8.9  --   ? ?Recent Labs  ?  05/29/21 ?0000  ?AST 11*  ?ALT 8*  ?ALKPHOS 88  ?ALBUMIN 3.3*  ? ?Recent Labs  ?  12/25/20 ?0000 05/29/21 ?0000 06/26/21 ?0000  ?WBC 4.9 5.6 7.2  ?NEUTROABS  --  3,584.00 3,708.00  ?HGB 13.3* 13.7 12.7*  ?HCT 40* 40* 38*  ?PLT 175 182 215  ? ?Lab Results  ?Component Value Date  ? TSH 4.26 05/29/2021  ? ?Lab Results  ?Component Value Date  ? HGBA1C 7.8 05/29/2021  ? ?No results found for: CHOL, HDL, LDLCALC, LDLDIRECT, TRIG, CHOLHDL ? ?Significant Diagnostic Results in last 30  days:  ?No results found. ? ?Assessment/Plan ?Adult failure to thrive ?Adult Failure to Thrive, weight stable, dc'd from Hospice service, Hgb 12.7 07/06/21 ? ?Type 2 diabetes mellitus with vascular disease

## 2021-09-23 ENCOUNTER — Encounter: Payer: Self-pay | Admitting: Nurse Practitioner

## 2021-09-23 NOTE — Assessment & Plan Note (Signed)
off Donepezil, SNF [facility] for supportive care  

## 2021-09-23 NOTE — Assessment & Plan Note (Signed)
Adult Failure to Thrive, weight stable, dc'd from Hospice service, Hgb 12.7 07/06/21 ?

## 2021-09-23 NOTE — Assessment & Plan Note (Signed)
Blood pressure is controlled, takes Amlodipine, Lisinopril Bun/creat 14/0.9 06/05/21 ?

## 2021-09-23 NOTE — Assessment & Plan Note (Signed)
Hgb a1c 7.3 12/24/20<<7.8 05/29/21, hs snack to avoid low CBG in am. On Tresiba(decreased to 18u 09/12/21 to avoid low CBG in am)  Novology coverage ?

## 2021-09-23 NOTE — Assessment & Plan Note (Signed)
takes Levothyroxine, TSH 4.53 12/24/20. 4.26 05/29/21 ?

## 2021-10-31 ENCOUNTER — Non-Acute Institutional Stay (SKILLED_NURSING_FACILITY): Payer: Medicare PPO | Admitting: Orthopedic Surgery

## 2021-10-31 ENCOUNTER — Encounter: Payer: Self-pay | Admitting: Orthopedic Surgery

## 2021-10-31 DIAGNOSIS — I10 Essential (primary) hypertension: Secondary | ICD-10-CM | POA: Diagnosis not present

## 2021-10-31 DIAGNOSIS — E1159 Type 2 diabetes mellitus with other circulatory complications: Secondary | ICD-10-CM | POA: Diagnosis not present

## 2021-10-31 DIAGNOSIS — R627 Adult failure to thrive: Secondary | ICD-10-CM

## 2021-10-31 DIAGNOSIS — F01C0 Vascular dementia, severe, without behavioral disturbance, psychotic disturbance, mood disturbance, and anxiety: Secondary | ICD-10-CM | POA: Diagnosis not present

## 2021-10-31 DIAGNOSIS — E039 Hypothyroidism, unspecified: Secondary | ICD-10-CM

## 2021-10-31 NOTE — Progress Notes (Signed)
?Location:  [facility] ?Nursing Home Room Number: W258/N ?Place of Service:  SNF (31) ?Provider: Yvonna Alanis, NP ? ?Patient Care Team: ?Mast, Man X, NP as PCP - General (Internal Medicine) ?[name], MD as PCP - Cardiology (Cardiology) ?[name], MD as Consulting Physician (Cardiology) ? ?Extended Emergency Contact Information ?Primary Emergency Contact: Breck,[name] ?Address: [phone] ?         [place], [place] [address] [place] ?Home Phone: [phone] ?Mobile Phone: [phone] ?Relation: Spouse ? ?Code Status:  DNR ?Goals of care: Advanced Directive information ? ?  10/31/2021  ?  3:08 PM  ?Advanced Directives  ?Does Patient Have a Medical Advance Directive? Yes  ?Type of Advance Directive Out of facility DNR (pink MOST or yellow form)  ?Does patient want to make changes to medical advance directive? No - Patient declined  ?Pre-existing out of facility DNR order (yellow form or pink MOST form) Pink MOST form placed in chart (order not valid for inpatient use);Yellow form placed in chart (order not valid for inpatient use)  ? ? ? ?Chief Complaint  ?Patient presents with  ? Medical Management of Chronic Issues  ?  Routine visit.  ? Quality Metric Gaps  ?  Discuss the need for eye exam and Shingrix vaccine, or post pone if patient refuses.  ? ? ?HPI:  ?Pt is a 81 y.o. male seen today for medical management of chronic diseases.   ? ?He currently resides on the skilled nursing unit at [facility]. PMH: aortic regurgitation, carotid artery stenosis, HTN, sinus pause, T2DM, OSA, hypothyroidism, vascular dementia, syncope, and gait abnormality.  ? ?Vascular dementia- CT head 2021 revealed advanced changes to cerebral while matter and chronic small vessel ischemic disease, no behavioral outbursts, total assist with ADLs, aphasia ?T2DM- A1c 7.8 05/29/2021, 04/11 hypoglycemic event in AM - blood sugar 67- improved with orange juice, sugars averaging 150-220's,  remains on Tresiba/ SSI/ lisinopril, ASA ?HTN- BUN/creat 14/0.9 06/05/2021, remains on amlodipine ?Hypothyroidism- TSH 4.26 05/29/2021, remains on levothyroxine ?FOT- weights stable, see trends below, discharged from hospice service 05/03/2021 ? ?No recent falls or injuries.  ? ?Covid bivalent vaccine given 10/23/2021, tolerated well.  ? ?Recent blood pressures: ? 05/08- 150/78, 135/70 ? 05/01- 147/87, 130/86 ? ?Recent weights: ? 05/01- 162 lbs ? 04/01- 158.8 lbs ? 03/01- 159.6 lbs ? ?  ? ?Past Medical History:  ?Diagnosis Date  ? Aortic regurgitation   ? mild by echo 08/2019  ? Carotid artery stenosis   ? 1-39% bilateral stenosis by dopplers 08/2019  ? Cataract   ? Complication of anesthesia   ? "high blood sugar; he's been loopy last 36h since OR"- 2016  ? Diabetes mellitus   ? DKA, type 2 ([place])   ? Hearing loss   ? Hypercholesteremia   ? Hypertension   ? Insulin pump in place   ? Macular degeneration   ? MCI (mild cognitive impairment) with memory loss 12/12/2014  ? OSA on CPAP 09/11/2014  ? PONV (postoperative nausea and vomiting)   ? Uses hearing aid   ? right ear, patient stated he lost left hearing aid   ? ?Past Surgical History:  ?Procedure Laterality Date  ? CHOLECYSTECTOMY N/A 10/28/2015  ? Procedure: LAPAROSCOPIC CHOLECYSTECTOMY WITH   INTRAOPERATIVE CHOLANGIOGRAM;  Surgeon: Greer Pickerel, MD;  Location: [facility];  Service: General;  Laterality: N/A;  ? SHOULDER ARTHROSCOPY Right 08/24/11  ? "frozen shoulder; RCR w/labrum tear; arthritis scraping; bone spurs"  ?  SHOULDER ARTHROSCOPY Left ~ 2006  ? TONSILLECTOMY  1949  ? "in childhood"  ? ? ?No Known Allergies ? ?Outpatient Encounter Medications as of 10/31/2021  ?Medication Sig  ? amLODipine (NORVASC) 10 MG tablet Take 10 mg by mouth daily.  ? aspirin EC 81 MG tablet Take 81 mg by mouth daily.  ? insulin aspart (NOVOLOG) 100 UNIT/ML injection Inject 6-12 Units into the skin 3 (three) times daily with meals. If Blood Sugar is 151 to 300 give 5 Units. ?301-450 10 units   ? levothyroxine (SYNTHROID) 25 MCG tablet Take 25 mcg by mouth daily before breakfast.  ? lisinopril (ZESTRIL) 30 MG tablet Take 30 mg by mouth daily.  ? nystatin (MYCOSTATIN/NYSTOP) powder Apply 1 application topically 3 (three) times daily. Apply to right and left buttocks with incontinent care every shift  ? nystatin-triamcinolone (MYCOLOG II) cream Apply 1 application topically 2 (two) times daily.  ? TRESIBA FLEXTOUCH 100 UNIT/ML FlexTouch Pen Inject 18 Units into the skin in the morning.  ? ?No facility-administered encounter medications on file as of 10/31/2021.  ? ? ?Review of Systems  ?Unable to perform ROS: Dementia  ? ?Immunization History  ?Administered Date(s) Administered  ? DTaP, 5 pertussis antigens 09/07/2016  ? Influenza Split 05/09/2010, 03/02/2011, 03/21/2012, 04/06/2013, 03/26/2014, 04/03/2020  ? Influenza, High Dose Seasonal PF 04/06/2013, 04/02/2016, 03/31/2017  ? Influenza, Quadrivalent, Recombinant, Inj, Pf 03/14/2018, 03/06/2019, 05/15/2020  ? Influenza,inj,Quad PF,6+ Mos 05/01/2015  ? Influenza-Unspecified 04/09/2021  ? Moderna SARS-COV2 Booster Vaccination 04/30/2020, 11/19/2020  ? Moderna Sars-Covid-2 Vaccination 06/26/2019, 07/24/2019  ? PFIZER(Purple Top)SARS-COV-2 Vaccination 04/09/2021  ? Pneumococcal Conjugate-13 01/31/2014, 08/22/2015  ? Pneumococcal Polysaccharide-23 05/21/2008, 05/23/2009  ? Td 05/18/2007, 05/23/2009  ? Tdap 03/03/2012  ? Zoster, Live 05/21/2008, 05/23/2009  ? ?Pertinent  Health Maintenance Due  ?Topic Date Due  ? OPHTHALMOLOGY EXAM  Never done  ? COLONOSCOPY (Pts 45-30yr Insurance coverage will need to be confirmed)  06/18/2022 (Originally 04/13/2019)  ? HEMOGLOBIN A1C  11/27/2021  ? INFLUENZA VACCINE  01/20/2022  ? FOOT EXAM  09/23/2022  ? ? ?  07/25/2019  ? 11:38 PM 08/28/2019  ?  1:47 PM 07/20/2020  ?  2:00 PM 07/21/2020  ?  4:00 AM 07/21/2020  ? 10:00 AM  ?Fall Risk  ?Patient Fall Risk Level Moderate fall risk High fall risk High fall risk High fall risk High fall  risk  ? ?Functional Status Survey: ?  ? ?Vitals:  ? 10/31/21 1506  ?BP: (!) 150/78  ?Pulse: 72  ?Resp: 17  ?Temp: (!) 96.9 ?F (36.1 ?C)  ?SpO2: 96%  ?Weight: 162 lb (73.5 kg)  ?Height: '5\' 5"'$  (1.651 m)  ? ?Body mass index is 26.96 kg/m?.Marland Kitchen?Physical Exam ?Vitals reviewed.  ?Constitutional:   ?   General: He is not in acute distress. ?HENT:  ?   Head: Normocephalic.  ?   Right Ear: There is no impacted cerumen.  ?   Left Ear: There is no impacted cerumen.  ?   Nose: Nose normal.  ?   Mouth/Throat:  ?   Mouth: Mucous membranes are moist.  ?Eyes:  ?   General:     ?   Right eye: No discharge.     ?   Left eye: No discharge.  ?Cardiovascular:  ?   Rate and Rhythm: Normal rate and regular rhythm.  ?   Pulses: Normal pulses.  ?   Heart sounds: Normal heart sounds.  ?Pulmonary:  ?   Effort: Pulmonary effort is normal. No  respiratory distress.  ?   Breath sounds: Normal breath sounds. No wheezing.  ?Abdominal:  ?   General: Bowel sounds are normal. There is no distension.  ?   Palpations: Abdomen is soft.  ?   Tenderness: There is no abdominal tenderness.  ?Musculoskeletal:  ?   Cervical back: Neck supple.  ?   Right lower leg: Edema present.  ?   Left lower leg: Edema present.  ?   Comments: R>L, non pitting  ?Skin: ?   General: Skin is warm and dry.  ?   Capillary Refill: Capillary refill takes less than 2 seconds.  ?   Comments: Cherry angioma to chest  ?Neurological:  ?   General: No focal deficit present.  ?   Mental Status: He is alert. Mental status is at baseline.  ?   Motor: Weakness present.  ?   Gait: Gait abnormal.  ?Psychiatric:     ?   Mood and Affect: Mood normal.     ?   Behavior: Behavior normal.  ? ? ?Labs reviewed: ?Recent Labs  ?  03/11/21 ?0000 05/29/21 ?0000 06/05/21 ?0000  ?NA 140 148* 146  ?K 4.5 3.6 3.7  ?CL 107 112* 110*  ?CO2 27* 24* 30*  ?BUN '14 17 14  '$ ?CREATININE 1.0 0.9 0.9  ?CALCIUM 8.8 8.9  --   ? ?Recent Labs  ?  05/29/21 ?0000  ?AST 11*  ?ALT 8*  ?ALKPHOS 88  ?ALBUMIN 3.3*  ? ?Recent Labs  ?   12/25/20 ?0000 05/29/21 ?0000 06/26/21 ?0000  ?WBC 4.9 5.6 7.2  ?NEUTROABS  --  3,584.00 3,708.00  ?HGB 13.3* 13.7 12.7*  ?HCT 40* 40* 38*  ?PLT 175 182 215  ? ?Lab Results  ?Component Value Date  ?

## 2021-11-04 DIAGNOSIS — E1122 Type 2 diabetes mellitus with diabetic chronic kidney disease: Secondary | ICD-10-CM | POA: Diagnosis not present

## 2021-11-04 DIAGNOSIS — Z794 Long term (current) use of insulin: Secondary | ICD-10-CM | POA: Diagnosis not present

## 2021-11-04 DIAGNOSIS — I10 Essential (primary) hypertension: Secondary | ICD-10-CM | POA: Diagnosis not present

## 2021-11-05 LAB — BASIC METABOLIC PANEL
BUN: 16 (ref 4–21)
CO2: 29 — AB (ref 13–22)
Chloride: 110 — AB (ref 99–108)
Creatinine: 0.9 (ref 0.6–1.3)
Glucose: 95
Potassium: 3.8 mEq/L (ref 3.5–5.1)
Sodium: 145 (ref 137–147)

## 2021-11-05 LAB — COMPREHENSIVE METABOLIC PANEL
Albumin: 3.3 — AB (ref 3.5–5.0)
Calcium: 8.5 — AB (ref 8.7–10.7)
Globulin: 2.3
eGFR: 83

## 2021-11-05 LAB — HEPATIC FUNCTION PANEL
ALT: 9 U/L — AB (ref 10–40)
AST: 12 — AB (ref 14–40)
Alkaline Phosphatase: 92 (ref 25–125)
Bilirubin, Total: 0.4

## 2021-11-05 LAB — CBC AND DIFFERENTIAL
HCT: 40 — AB (ref 41–53)
Hemoglobin: 13.7 (ref 13.5–17.5)
Neutrophils Absolute: 2413
Platelets: 178 10*3/uL (ref 150–400)
WBC: 5.2

## 2021-11-05 LAB — HEMOGLOBIN A1C: Hemoglobin A1C: 8.3

## 2021-11-05 LAB — CBC: RBC: 4.42 (ref 3.87–5.11)

## 2021-12-02 ENCOUNTER — Encounter: Payer: Self-pay | Admitting: Internal Medicine

## 2021-12-02 ENCOUNTER — Non-Acute Institutional Stay (SKILLED_NURSING_FACILITY): Payer: Medicare PPO | Admitting: Internal Medicine

## 2021-12-02 DIAGNOSIS — F01C0 Vascular dementia, severe, without behavioral disturbance, psychotic disturbance, mood disturbance, and anxiety: Secondary | ICD-10-CM | POA: Diagnosis not present

## 2021-12-02 DIAGNOSIS — R627 Adult failure to thrive: Secondary | ICD-10-CM | POA: Diagnosis not present

## 2021-12-02 DIAGNOSIS — I10 Essential (primary) hypertension: Secondary | ICD-10-CM

## 2021-12-02 DIAGNOSIS — E1159 Type 2 diabetes mellitus with other circulatory complications: Secondary | ICD-10-CM

## 2021-12-02 DIAGNOSIS — E039 Hypothyroidism, unspecified: Secondary | ICD-10-CM

## 2021-12-02 NOTE — Progress Notes (Signed)
Location:   [facility] Room Number: 27 Place of Service:  SNF (31) Provider:  Veleta Miners MD   Mast, Man X, NP  Patient Care Team: Mast, Man X, NP as PCP - General (Internal Medicine) [name], MD as PCP - Cardiology (Cardiology) [name], MD as Consulting Physician (Cardiology)  Extended Emergency Contact Information Primary Emergency Contact: Laughter,[name] Address: [address]          [place], [place] [address] [name] Phone: [phone] Mobile Phone: [phone] Relation: Spouse  Code Status:  DNR Managed Care Goals of care: Advanced Directive information    12/02/2021   11:32 AM  Advanced Directives  Does Patient Have a Medical Advance Directive? Yes  Type of Advance Directive Out of facility DNR (pink MOST or yellow form)  Does patient want to make changes to medical advance directive? No - Patient declined  Pre-existing out of facility DNR order (yellow form or pink MOST form) Pink MOST form placed in chart (order not valid for inpatient use);Yellow form placed in chart (order not valid for inpatient use)     Chief Complaint  Patient presents with   Medical Management of Chronic Issues   Quality Metric Gaps    Verified Matrix and [facility] patient is due for: OPHTHALMOLOGY EXAM (Yearly)    Zoster Vaccines- Shingrix (1 of 2)       HPI:  Pt is a 81 y.o. male seen today for medical management of chronic diseases.    Patient has a history of diabetes type 2, hypertension, HLD, carotid stenosis history of OSA on CPAP Also has a history of unexplained syncope.s/p ILR Patient also has a history of dementia    Lives in SNF Discharged from hospice as weight stabilized Continues to have aphasia Non Ambulatory  Per family His wife he is not responding as he uses to  He was not responding to me either  They just want him to be comfortable No other Nursing issues. No Behavior issues His weight  is stable Hoyer list dependent No Falls Wt Readings from Last 3 Encounters:  12/02/21 163 lb 4.8 oz (74.1 kg)  10/31/21 162 lb (73.5 kg)  09/22/21 158 lb 12.8 oz (72 kg)    Past Medical History:  Diagnosis Date   Aortic regurgitation    mild by echo 08/2019   Carotid artery stenosis    1-39% bilateral stenosis by dopplers 08/2019   Cataract    Complication of anesthesia    "high blood sugar; he's been loopy last 36h since OR"- 2016   Diabetes mellitus    DKA, type 2 ([place])    Hearing loss    Hypercholesteremia    Hypertension    Insulin pump in place    Macular degeneration    MCI (mild cognitive impairment) with memory loss 12/12/2014   OSA on CPAP 09/11/2014   PONV (postoperative nausea and vomiting)    Uses hearing aid    right ear, patient stated he lost left hearing aid    Past Surgical History:  Procedure Laterality Date   CHOLECYSTECTOMY N/A 10/28/2015   Procedure: LAPAROSCOPIC CHOLECYSTECTOMY WITH   INTRAOPERATIVE CHOLANGIOGRAM;  Surgeon: Greer Pickerel, MD;  Location: [facility];  Service: General;  Laterality: N/A;   SHOULDER ARTHROSCOPY Right 08/24/11   "frozen shoulder; RCR w/labrum tear; arthritis scraping; bone spurs"   SHOULDER ARTHROSCOPY Left ~ 2006   TONSILLECTOMY  1949   "in childhood"  No Known Allergies  Allergies as of 12/02/2021   No Known Allergies      Medication List        Accurate as of December 02, 2021 11:33 AM. If you have any questions, ask your nurse or doctor.          amLODipine 10 MG tablet Commonly known as: NORVASC Take 10 mg by mouth daily.   aspirin EC 81 MG tablet Take 81 mg by mouth daily.   insulin aspart 100 UNIT/ML injection Commonly known as: novoLOG Inject 6-12 Units into the skin 3 (three) times daily with meals. If Blood Sugar is 151 to 300 give 5 Units. 301-450 10 units   levothyroxine 25 MCG tablet Commonly known as: SYNTHROID Take 25 mcg by mouth daily before breakfast.   lisinopril 30 MG tablet Commonly  known as: ZESTRIL Take 30 mg by mouth daily.   nystatin powder Commonly known as: MYCOSTATIN/NYSTOP Apply 1 application topically 3 (three) times daily. Apply to right and left buttocks with incontinent care every shift   nystatin-triamcinolone cream Commonly known as: MYCOLOG II Apply 1 application topically 2 (two) times daily.   [name] FlexTouch 100 UNIT/ML FlexTouch Pen Generic drug: insulin degludec Inject 18 Units into the skin in the morning.        Review of Systems  Unable to perform ROS: Dementia    Immunization History  Administered Date(s) Administered   DTaP, 5 pertussis antigens 09/07/2016   Influenza Split 05/09/2010, 03/02/2011, 03/21/2012, 04/06/2013, 03/26/2014, 04/03/2020   Influenza, High Dose Seasonal PF 04/06/2013, 04/02/2016, 03/31/2017   Influenza, Quadrivalent, Recombinant, Inj, Pf 03/14/2018, 03/06/2019, 05/15/2020   Influenza,inj,Quad PF,6+ Mos 05/01/2015   Influenza-Unspecified 04/09/2021   Moderna SARS-COV2 Booster Vaccination 04/30/2020, 11/19/2020   Moderna Sars-Covid-2 Vaccination 06/26/2019, 07/24/2019   PFIZER(Purple Top)SARS-COV-2 Vaccination 04/09/2021   Pneumococcal Conjugate-13 01/31/2014, 08/22/2015   Pneumococcal Polysaccharide-23 05/21/2008, 05/23/2009   Td 05/18/2007, 05/23/2009   Tdap 03/03/2012   Zoster, Live 05/21/2008, 05/23/2009   Pertinent  Health Maintenance Due  Topic Date Due   OPHTHALMOLOGY EXAM  Never done   COLONOSCOPY (Pts 45-53yr Insurance coverage will need to be confirmed)  06/18/2022 (Originally 04/13/2019)   INFLUENZA VACCINE  01/20/2022   HEMOGLOBIN A1C  05/08/2022   FOOT EXAM  09/23/2022      07/25/2019   11:38 PM 08/28/2019    1:47 PM 07/20/2020    2:00 PM 07/21/2020    4:00 AM 07/21/2020   10:00 AM  Fall Risk  Patient Fall Risk Level Moderate fall risk High fall risk High fall risk High fall risk High fall risk   Functional Status Survey:    Vitals:   12/02/21 1125  BP: 127/70  Pulse: 67  Resp:  16  Temp: 97.8 F (36.6 C)  SpO2: 98%  Weight: 163 lb 4.8 oz (74.1 kg)  Height: '5\' 5"'$  (1.651 m)   Body mass index is 27.17 kg/m. Physical Exam Vitals reviewed.  Constitutional:      Appearance: Normal appearance.  HENT:     Head: Normocephalic.     Nose: Nose normal.     Mouth/Throat:     Mouth: Mucous membranes are moist.     Pharynx: Oropharynx is clear.  Eyes:     Pupils: Pupils are equal, round, and reactive to light.  Cardiovascular:     Rate and Rhythm: Normal rate and regular rhythm.     Pulses: Normal pulses.     Heart sounds: No murmur heard. Pulmonary:  Effort: Pulmonary effort is normal. No respiratory distress.     Breath sounds: Normal breath sounds. No rales.  Abdominal:     General: Abdomen is flat. Bowel sounds are normal.     Palpations: Abdomen is soft.  Musculoskeletal:        General: No swelling.     Cervical back: Neck supple.  Skin:    General: Skin is warm.  Neurological:     Mental Status: He is alert.     Comments: Does not respond or follows commands  Psychiatric:        Mood and Affect: Mood normal.        Thought Content: Thought content normal.     Labs reviewed: Recent Labs    03/11/21 0000 05/29/21 0000 06/05/21 0000 11/05/21 0000  NA 140 148* 146 145  K 4.5 3.6 3.7 3.8  CL 107 112* 110* 110*  CO2 27* 24* 30* 29*  BUN '14 17 14 16  '$ CREATININE 1.0 0.9 0.9 0.9  CALCIUM 8.8 8.9  --  8.5*   Recent Labs    05/29/21 0000 11/05/21 0000  AST 11* 12*  ALT 8* 9*  ALKPHOS 88 92  ALBUMIN 3.3* 3.3*   Recent Labs    05/29/21 0000 06/26/21 0000 11/05/21 0000  WBC 5.6 7.2 5.2  NEUTROABS 3,584.00 3,708.00 2,413.00  HGB 13.7 12.7* 13.7  HCT 40* 38* 40*  PLT 182 215 178   Lab Results  Component Value Date   TSH 4.26 05/29/2021   Lab Results  Component Value Date   HGBA1C 8.3 11/05/2021   No results found for: "CHOL", "HDL", "LDLCALC", "LDLDIRECT", "TRIG", "CHOLHDL"  Significant Diagnostic Results in last 30 days:   No results found.  Assessment/Plan 1. Type 2 diabetes mellitus with vascular disease ([place]) CBGs vary from 90-300 Gets coverage with Lantus and Bolus A1C slightly high   2. Primary hypertension Control on lisinopril and Norvasc  3. Hypothyroidism, unspecified type Tsh normal in 12/22  4. Severe vascular dementia without behavioral disturbance, psychotic disturbance, mood disturbance, or anxiety (HCC) Total Care Less Responsive Family wants him to be comfortable  5. Adult failure to thrive Weight stable    Family/ staff Communication:   Labs/tests ordered:

## 2021-12-22 ENCOUNTER — Non-Acute Institutional Stay (SKILLED_NURSING_FACILITY): Payer: Medicare PPO | Admitting: Adult Health

## 2021-12-22 ENCOUNTER — Encounter: Payer: Self-pay | Admitting: Adult Health

## 2021-12-22 DIAGNOSIS — I10 Essential (primary) hypertension: Secondary | ICD-10-CM

## 2021-12-22 DIAGNOSIS — E039 Hypothyroidism, unspecified: Secondary | ICD-10-CM

## 2021-12-22 DIAGNOSIS — E1159 Type 2 diabetes mellitus with other circulatory complications: Secondary | ICD-10-CM

## 2021-12-22 DIAGNOSIS — F01C0 Vascular dementia, severe, without behavioral disturbance, psychotic disturbance, mood disturbance, and anxiety: Secondary | ICD-10-CM

## 2021-12-22 NOTE — Progress Notes (Signed)
Location:  [facility] Room Number: N027/A Place of Service:  SNF (31) Provider:  Durenda Age, DNP, FNP-BC  Patient Care Team: Mast, Man X, NP as PCP - General (Internal Medicine) [name], MD as PCP - Cardiology (Cardiology) [name], MD as Consulting Physician (Cardiology)  Extended Emergency Contact Information Primary Emergency Contact: Valenza,[name] Address: [address]          [place], [place] [address] [name] of [place] Phone: [phone] Mobile Phone: [phone] Relation: Spouse  Code Status:  DNR  Goals of care: Advanced Directive information    12/22/2021    2:30 PM  Advanced Directives  Does Patient Have a Medical Advance Directive? Yes  Type of Advance Directive Out of facility DNR (pink MOST or yellow form)  Does patient want to make changes to medical advance directive? No - Patient declined  Pre-existing out of facility DNR order (yellow form or pink MOST form) Pink MOST form placed in chart (order not valid for inpatient use);Yellow form placed in chart (order not valid for inpatient use)     Chief Complaint  Patient presents with   Medical Management of Chronic Issues    Routine Visit    HPI:  Pt is a 81 y.o. male seen today for medical management of chronic diseases.  He has a PMH of dementia, hypertension, diabetes mellitus type 2, hyperlipidemia, aortic regurgitation and carotid stenosis. He is a long-term care resident [facility].  Primary hypertension  -  SBPs ranging from 123-143, with outlier 148, takes lisinopril30 mg daily and amlodipine 10 mg daily  Type 2 diabetes mellitus with vascular disease (HCC)  -  CBGs ranging from 101 to 330, with outlier 82, 92 and 455, takes NovoLog sliding scale TIDac and Tresiba 18 units daily  Hypothyroidism, unspecified type  - TSH 4.26, takes levothyroxine 25 mcg daily  Severe vascular dementia without behavioral disturbance,  psychotic disturbance, mood disturbance, or anxiety (HCC)  -  BIMS score 0, 11/05/2021    Past Medical History:  Diagnosis Date   Aortic regurgitation    mild by echo 08/2019   Carotid artery stenosis    1-39% bilateral stenosis by dopplers 08/2019   Cataract    Complication of anesthesia    "high blood sugar; he's been loopy last 36h since OR"- 2016   Diabetes mellitus    DKA, type 2 ([place])    Hearing loss    Hypercholesteremia    Hypertension    Insulin pump in place    Macular degeneration    MCI (mild cognitive impairment) with memory loss 12/12/2014   OSA on CPAP 09/11/2014   PONV (postoperative nausea and vomiting)    Uses hearing aid    right ear, patient stated he lost left hearing aid    Past Surgical History:  Procedure Laterality Date   CHOLECYSTECTOMY N/A 10/28/2015   Procedure: LAPAROSCOPIC CHOLECYSTECTOMY WITH   INTRAOPERATIVE CHOLANGIOGRAM;  Surgeon: Greer Pickerel, MD;  Location: [facility];  Service: General;  Laterality: N/A;   SHOULDER ARTHROSCOPY Right 08/24/11   "frozen shoulder; RCR w/labrum tear; arthritis scraping; bone spurs"   SHOULDER ARTHROSCOPY Left ~ 2006   TONSILLECTOMY  1949   "in childhood"    No Known Allergies  Outpatient Encounter Medications as of 12/22/2021  Medication Sig   amLODipine (NORVASC) 10 MG tablet Take 10 mg by mouth daily.   aspirin EC 81 MG tablet Take 81 mg by mouth daily.  insulin aspart (NOVOLOG) 100 UNIT/ML injection Inject 6-12 Units into the skin 3 (three) times daily with meals. If Blood Sugar is 151 to 300 give 5 Units. 301-450 10 units   levothyroxine (SYNTHROID) 25 MCG tablet Take 25 mcg by mouth daily before breakfast.   lisinopril (ZESTRIL) 30 MG tablet Take 30 mg by mouth daily.   nystatin (MYCOSTATIN/NYSTOP) powder Apply 1 application topically 3 (three) times daily. Apply to right and left buttocks with incontinent care every shift   nystatin-triamcinolone (MYCOLOG II) cream Apply 1 application  topically 2 (two) times  daily. Apply to right/left buttocks and any redden area until healed   TRESIBA FLEXTOUCH 100 UNIT/ML FlexTouch Pen Inject 18 Units into the skin in the morning.   No facility-administered encounter medications on file as of 12/22/2021.    Review of Systems   Unable to obtain due to dementia.    Immunization History  Administered Date(s) Administered   DTaP, 5 pertussis antigens 09/07/2016   Influenza Split 05/09/2010, 03/02/2011, 03/21/2012, 04/06/2013, 03/26/2014, 04/03/2020   Influenza, High Dose Seasonal PF 04/06/2013, 04/02/2016, 03/31/2017   Influenza, Quadrivalent, Recombinant, Inj, Pf 03/14/2018, 03/06/2019, 05/15/2020   Influenza,inj,Quad PF,6+ Mos 05/01/2015   Influenza-Unspecified 04/09/2021   Moderna SARS-COV2 Booster Vaccination 04/30/2020, 11/19/2020   Moderna Sars-Covid-2 Vaccination 06/26/2019, 07/24/2019   PFIZER(Purple Top)SARS-COV-2 Vaccination 04/09/2021   Pneumococcal Conjugate-13 01/31/2014, 08/22/2015   Pneumococcal Polysaccharide-23 05/21/2008, 05/23/2009   Td 05/18/2007, 05/23/2009   Tdap 03/03/2012   Zoster, Live 05/21/2008, 05/23/2009   Pertinent  Health Maintenance Due  Topic Date Due   OPHTHALMOLOGY EXAM  Never done   COLONOSCOPY (Pts 45-74yr Insurance coverage will need to be confirmed)  06/18/2022 (Originally 04/13/2019)   INFLUENZA VACCINE  01/20/2022   HEMOGLOBIN A1C  05/08/2022   FOOT EXAM  09/23/2022      07/25/2019   11:38 PM 08/28/2019    1:47 PM 07/20/2020    2:00 PM 07/21/2020    4:00 AM 07/21/2020   10:00 AM  Fall Risk  Patient Fall Risk Level Moderate fall risk High fall risk High fall risk High fall risk High fall risk     Vitals:   12/22/21 1428  BP: (!) 142/89  Pulse: 78  Resp: 16  Temp: (!) 97.1 F (36.2 C)  SpO2: 94%  Weight: 159 lb 9.6 oz (72.4 kg)  Height: '5\' 5"'$  (1.651 m)   Body mass index is 26.56 kg/m.  Physical Exam Constitutional:      General: He is not in acute distress.    Appearance: Normal appearance.   HENT:     Head: Normocephalic and atraumatic.     Mouth/Throat:     Mouth: Mucous membranes are moist.  Eyes:     Conjunctiva/sclera: Conjunctivae normal.  Cardiovascular:     Rate and Rhythm: Normal rate and regular rhythm.     Pulses: Normal pulses.     Heart sounds: Normal heart sounds.  Pulmonary:     Effort: Pulmonary effort is normal.     Breath sounds: Normal breath sounds.  Abdominal:     General: Bowel sounds are normal.     Palpations: Abdomen is soft.  Musculoskeletal:        General: No swelling.  Skin:    General: Skin is warm and dry.  Neurological:     Mental Status: Mental status is at baseline. He is disoriented.     Comments: Alert to self, disoriented to time and place.  Psychiatric:  Mood and Affect: Mood normal.        Behavior: Behavior normal.        Thought Content: Thought content normal.        Judgment: Judgment normal.      Labs reviewed: Recent Labs    03/11/21 0000 05/29/21 0000 06/05/21 0000 11/05/21 0000  NA 140 148* 146 145  K 4.5 3.6 3.7 3.8  CL 107 112* 110* 110*  CO2 27* 24* 30* 29*  BUN '14 17 14 16  '$ CREATININE 1.0 0.9 0.9 0.9  CALCIUM 8.8 8.9  --  8.5*   Recent Labs    05/29/21 0000 11/05/21 0000  AST 11* 12*  ALT 8* 9*  ALKPHOS 88 92  ALBUMIN 3.3* 3.3*   Recent Labs    05/29/21 0000 06/26/21 0000 11/05/21 0000  WBC 5.6 7.2 5.2  NEUTROABS 3,584.00 3,708.00 2,413.00  HGB 13.7 12.7* 13.7  HCT 40* 38* 40*  PLT 182 215 178   Lab Results  Component Value Date   TSH 4.26 05/29/2021   Lab Results  Component Value Date   HGBA1C 8.3 11/05/2021   No results found for: "CHOL", "HDL", "LDLCALC", "LDLDIRECT", "TRIG", "CHOLHDL"  Significant Diagnostic Results in last 30 days:  No results found.  Assessment/Plan  1. Primary hypertension -Blood pressure well controlled Continue current medicationc   2. Type 2 diabetes mellitus with vascular disease ([place]) Lab Results  Component Value Date   HGBA1C 8.3  11/05/2021   -  Stable, continue NovoLog and Tresiba  3. Hypothyroidism, unspecified type Lab Results  Component Value Date   TSH 4.26 05/29/2021   -   Continue levothyroxine  4. Severe vascular dementia without behavioral disturbance, psychotic disturbance, mood disturbance, or anxiety ([place]) -   Continue supportive care -   Fall precautions    Family/ staff Communication: Discussed plan of care with charge nurse.  Labs/tests ordered:   None    Durenda Age, DNP, MSN, FNP-BC [facility] [phone] (Monday-Friday 8:00 a.m. - 5:00 p.m.) [phone] (after hours)

## 2022-01-23 ENCOUNTER — Encounter: Payer: Self-pay | Admitting: Nurse Practitioner

## 2022-01-23 ENCOUNTER — Non-Acute Institutional Stay (SKILLED_NURSING_FACILITY): Payer: Medicare PPO | Admitting: Nurse Practitioner

## 2022-01-23 DIAGNOSIS — E870 Hyperosmolality and hypernatremia: Secondary | ICD-10-CM

## 2022-01-23 DIAGNOSIS — R55 Syncope and collapse: Secondary | ICD-10-CM

## 2022-01-23 DIAGNOSIS — F01C0 Vascular dementia, severe, without behavioral disturbance, psychotic disturbance, mood disturbance, and anxiety: Secondary | ICD-10-CM

## 2022-01-23 DIAGNOSIS — I10 Essential (primary) hypertension: Secondary | ICD-10-CM

## 2022-01-23 DIAGNOSIS — R627 Adult failure to thrive: Secondary | ICD-10-CM | POA: Diagnosis not present

## 2022-01-23 DIAGNOSIS — E1159 Type 2 diabetes mellitus with other circulatory complications: Secondary | ICD-10-CM | POA: Diagnosis not present

## 2022-01-23 DIAGNOSIS — E039 Hypothyroidism, unspecified: Secondary | ICD-10-CM

## 2022-01-23 NOTE — Progress Notes (Signed)
Location:  [facility] Room Number: NO/27/A Place of Service:  SNF (31) Provider: Eula Jaster X, NP  Patient Care Team: Lourie Retz X, NP as PCP - General (Internal Medicine) [name], MD as PCP - Cardiology (Cardiology) [name], MD as Consulting Physician (Cardiology)  Extended Emergency Contact Information Primary Emergency Contact: Lona,[name] Address: [address]          [place], [place] [address] [name] of [place] Phone: [phone] Mobile Phone: [phone] Relation: Spouse  Code Status:  DNR Goals of care: Advanced Directive information    01/23/2022   10:05 AM  Advanced Directives  Does Patient Have a Medical Advance Directive? Yes  Type of Advance Directive Out of facility DNR (pink MOST or yellow form)  Does patient want to make changes to medical advance directive? No - Patient declined  Pre-existing out of facility DNR order (yellow form or pink MOST form) Pink MOST form placed in chart (order not valid for inpatient use);Yellow form placed in chart (order not valid for inpatient use)     Chief Complaint  Patient presents with  . Medical Management of Chronic Issues    Patient is here for a follow up for chronic conditions Pt is due for annual eye exam, and discuss need for vaccines, fall screening    HPI:  Pt is a 81 y.o. male seen today for managing chronic medical conditions     Adult Failure to Thrive, weight stabilized.               T2DM Hgb a1c 8.3 11/05/21,  hs snack to avoid low CBG in am. On Tresiba,  Novolog coverage             Syncope, stopped Labetalol, no recurrence reported             HTN, takes Amlodipine, Lisinopril Bun/creat 16/0.9 11/05/21             Hypothyroidism, takes Levothyroxine, TSH 4.26 05/29/21             Dementia, off Donepezil, SNF [facility] for supportive care             Hypernatremia, Na 145 11/05/21  Past Medical History:  Diagnosis Date  . Aortic regurgitation    mild  by echo 08/2019  . Carotid artery stenosis    1-39% bilateral stenosis by dopplers 08/2019  . Cataract   . Complication of anesthesia    "high blood sugar; he's been loopy last 36h since OR"- 2016  . Diabetes mellitus   . DKA, type 2 ([employer])   . Hearing loss   . Hypercholesteremia   . Hypertension   . Insulin pump in place   . Macular degeneration   . MCI (mild cognitive impairment) with memory loss 12/12/2014  . OSA on CPAP 09/11/2014  . PONV (postoperative nausea and vomiting)   . Uses hearing aid    right ear, patient stated he lost left hearing aid    Past Surgical History:  Procedure Laterality Date  . CHOLECYSTECTOMY N/A 10/28/2015   Procedure: LAPAROSCOPIC CHOLECYSTECTOMY WITH   INTRAOPERATIVE CHOLANGIOGRAM;  Surgeon: Greer Pickerel, MD;  Location: [facility];  Service: General;  Laterality: N/A;  . SHOULDER ARTHROSCOPY Right 08/24/11   "frozen shoulder; RCR w/labrum tear; arthritis scraping; bone spurs"  . SHOULDER ARTHROSCOPY Left ~ 2006  . TONSILLECTOMY  1949   "in childhood"    No Known Allergies  Outpatient Encounter Medications as  of 01/23/2022  Medication Sig  . amLODipine (NORVASC) 10 MG tablet Take 10 mg by mouth daily.  Marland Kitchen aspirin EC 81 MG tablet Take 81 mg by mouth daily.  . insulin aspart (NOVOLOG) 100 UNIT/ML injection Inject 6-12 Units into the skin 3 (three) times daily with meals. If Blood Sugar is 151 to 300 give 5 Units. 301-450 10 units  . levothyroxine (SYNTHROID) 25 MCG tablet Take 25 mcg by mouth daily before breakfast.  . lisinopril (ZESTRIL) 30 MG tablet Take 30 mg by mouth daily.  Marland Kitchen nystatin (MYCOSTATIN/NYSTOP) powder Apply 1 application topically 3 (three) times daily. Apply to right and left buttocks with incontinent care every shift  . nystatin-triamcinolone (MYCOLOG II) cream Apply 1 application  topically 2 (two) times daily. Apply to right/left buttocks and any redden area until healed  . TRESIBA FLEXTOUCH 100 UNIT/ML FlexTouch Pen Inject 18 Units into the  skin in the morning.   No facility-administered encounter medications on file as of 01/23/2022.    Review of Systems  Unable to perform ROS: Dementia    Immunization History  Administered Date(s) Administered  . DTaP, 5 pertussis antigens 09/07/2016  . Influenza Split 05/09/2010, 03/02/2011, 03/21/2012, 04/06/2013, 03/26/2014, 04/03/2020  . Influenza, High Dose Seasonal PF 04/06/2013, 04/02/2016, 03/31/2017  . Influenza, Quadrivalent, Recombinant, Inj, Pf 03/14/2018, 03/06/2019, 05/15/2020  . Influenza,inj,Quad PF,6+ Mos 05/01/2015  . Influenza-Unspecified 04/09/2021  . Moderna SARS-COV2 Booster Vaccination 04/30/2020, 11/19/2020  . Moderna Sars-Covid-2 Vaccination 06/26/2019, 07/24/2019  . PFIZER(Purple Top)SARS-COV-2 Vaccination 04/09/2021  . Pneumococcal Conjugate-13 01/31/2014, 08/22/2015  . Pneumococcal Polysaccharide-23 05/21/2008, 05/23/2009  . Td 05/18/2007, 05/23/2009  . Tdap 03/03/2012  . Zoster, Live 05/21/2008, 05/23/2009   Pertinent  Health Maintenance Due  Topic Date Due  . OPHTHALMOLOGY EXAM  Never done  . INFLUENZA VACCINE  01/20/2022  . COLONOSCOPY (Pts 45-57yr Insurance coverage will need to be confirmed)  06/18/2022 (Originally 04/13/2019)  . HEMOGLOBIN A1C  05/08/2022  . FOOT EXAM  09/23/2022      07/25/2019   11:38 PM 08/28/2019    1:47 PM 07/20/2020    2:00 PM 07/21/2020    4:00 AM 07/21/2020   10:00 AM  Fall Risk  Patient Fall Risk Level Moderate fall risk High fall risk High fall risk High fall risk High fall risk   Functional Status Survey:    Vitals:   01/23/22 1007  BP: 137/68  Pulse: 68  Resp: 18  Temp: (!) 97.5 F (36.4 C)  SpO2: 96%  Weight: 164 lb (74.4 kg)  Height: '5\' 5"'$  (1.651 m)   Body mass index is 27.29 kg/m. Physical Exam Vitals and nursing note reviewed.  Constitutional:      Appearance: Normal appearance.  HENT:     Head: Normocephalic and atraumatic.     Mouth/Throat:     Mouth: Mucous membranes are moist.  Eyes:      Extraocular Movements: Extraocular movements intact.     Conjunctiva/sclera: Conjunctivae normal.     Pupils: Pupils are equal, round, and reactive to light.  Cardiovascular:     Rate and Rhythm: Normal rate and regular rhythm.     Heart sounds: No murmur heard. Pulmonary:     Effort: Pulmonary effort is normal.     Breath sounds: No rales.  Abdominal:     General: Bowel sounds are normal.     Palpations: Abdomen is soft.  Musculoskeletal:     Cervical back: Normal range of motion and neck supple.     Right lower  leg: No edema.     Left lower leg: No edema.  Skin:    General: Skin is warm and dry.     Comments: Dark red discoloration of R+L buttocks and sacrum. Scattered cherry angioma chest, abd, back, BLE R>L, angioma. From previous examination.    Neurological:     General: No focal deficit present.     Mental Status: He is alert.     Motor: No weakness.     Coordination: Coordination normal.     Gait: Gait abnormal.     Comments: Oriented to self.   Psychiatric:     Comments: Alert, smiled.     Labs reviewed: Recent Labs    03/11/21 0000 05/29/21 0000 06/05/21 0000 11/05/21 0000  NA 140 148* 146 145  K 4.5 3.6 3.7 3.8  CL 107 112* 110* 110*  CO2 27* 24* 30* 29*  BUN '14 17 14 16  '$ CREATININE 1.0 0.9 0.9 0.9  CALCIUM 8.8 8.9  --  8.5*   Recent Labs    05/29/21 0000 11/05/21 0000  AST 11* 12*  ALT 8* 9*  ALKPHOS 88 92  ALBUMIN 3.3* 3.3*   Recent Labs    05/29/21 0000 06/26/21 0000 11/05/21 0000  WBC 5.6 7.2 5.2  NEUTROABS 3,584.00 3,708.00 2,413.00  HGB 13.7 12.7* 13.7  HCT 40* 38* 40*  PLT 182 215 178   Lab Results  Component Value Date   TSH 4.26 05/29/2021   Lab Results  Component Value Date   HGBA1C 8.3 11/05/2021   No results found for: "CHOL", "HDL", "LDLCALC", "LDLDIRECT", "TRIG", "CHOLHDL"  Significant Diagnostic Results in last 30 days:  No results found.  Assessment/Plan Hypernatremia Na 145 11/05/21  Vascular dementia  ([place])  off Donepezil, SNF [facility] for supportive care  Hypothyroidism  takes Levothyroxine, TSH 4.26 05/29/21  HTN (hypertension) Blood pressure is controlled, takes Amlodipine, Lisinopril Bun/creat 16/0.9 11/05/21              Syncope with normal neurologic examination stopped Labetalol, no recurrence reported  Type 2 diabetes mellitus with vascular disease ([place]) Hgb a1c 8.3 11/05/21,  hs snack to avoid low CBG in am. On Tresiba,  Novolog coverage  Adult failure to thrive weight stabilized.      Family/ staff Communication: plan of care reviewed with the patient and charge nurse.   Labs/tests ordered: none  Time spend 35 minutes

## 2022-01-25 ENCOUNTER — Encounter: Payer: Self-pay | Admitting: Nurse Practitioner

## 2022-01-25 NOTE — Assessment & Plan Note (Signed)
stopped Labetalol, no recurrence reported 

## 2022-01-25 NOTE — Assessment & Plan Note (Signed)
Hgb a1c 8.3 11/05/21,  hs snack to avoid low CBG in am. On Tresiba,  Novolog coverage 

## 2022-01-25 NOTE — Assessment & Plan Note (Signed)
weight stabilized.

## 2022-01-25 NOTE — Assessment & Plan Note (Signed)
off Donepezil, SNF [facility] for supportive care  

## 2022-01-25 NOTE — Assessment & Plan Note (Signed)
Na 145 11/05/21  

## 2022-01-25 NOTE — Assessment & Plan Note (Signed)
takes Levothyroxine, TSH 4.26 05/29/21 

## 2022-01-25 NOTE — Assessment & Plan Note (Signed)
Blood pressure is controlled,  takes Amlodipine, Lisinopril Bun/creat 16/0.9 11/05/21           

## 2022-01-28 IMAGING — CT CT HEAD W/O CM
3 series · 15 of 47 positions shown, 18 images · non-contrast
Comparison: July 26, 2019

CLINICAL DATA: Recurrent syncope.

EXAM:
CT HEAD WITHOUT CONTRAST
TECHNIQUE: Contiguous axial images were obtained from the base of the skull
through the vertex without intravenous contrast. Automatic exposure
control utilized.

[Series 2: head wo · axial · 0.48mm/px · z∈[+1376,+1511]mm · 9 of 33 slices shown, 12 images]
[im 3/33  brain]
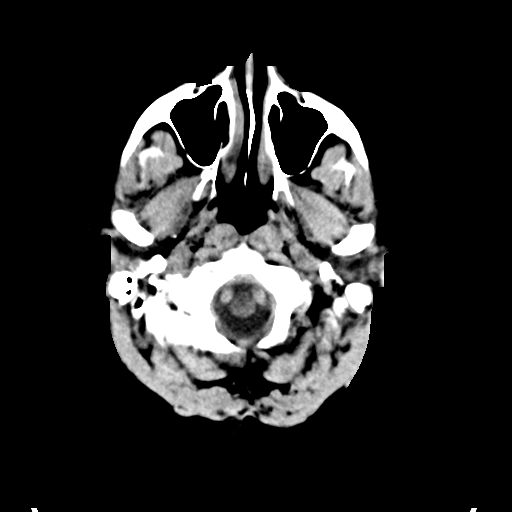
[im 3/33  bone]
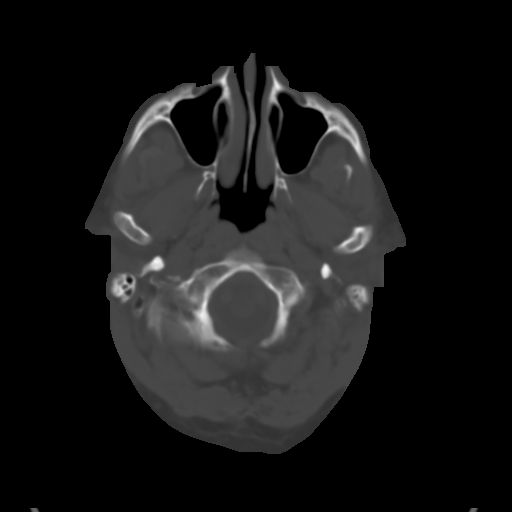
[im 6/33  brain]
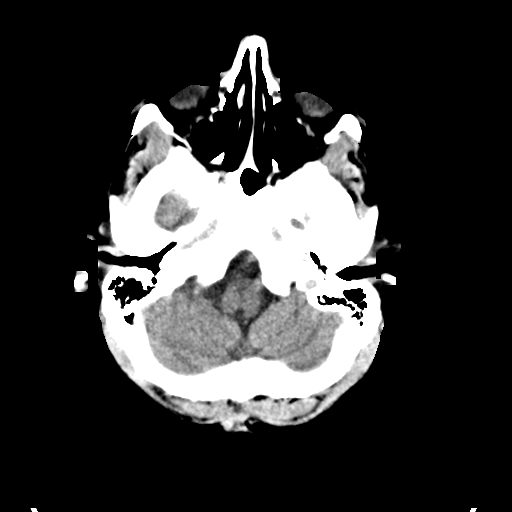
[im 9/33  brain]
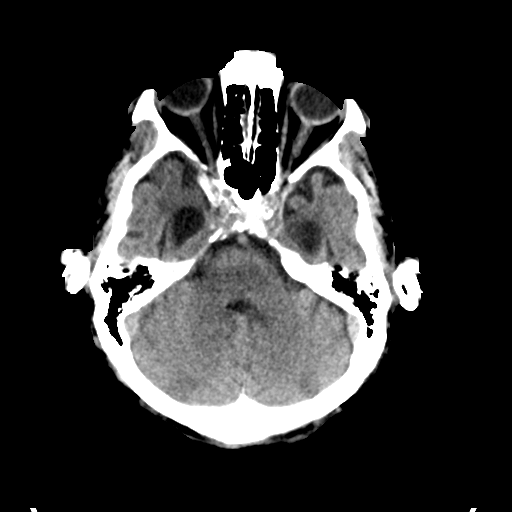
[im 13/33  brain]
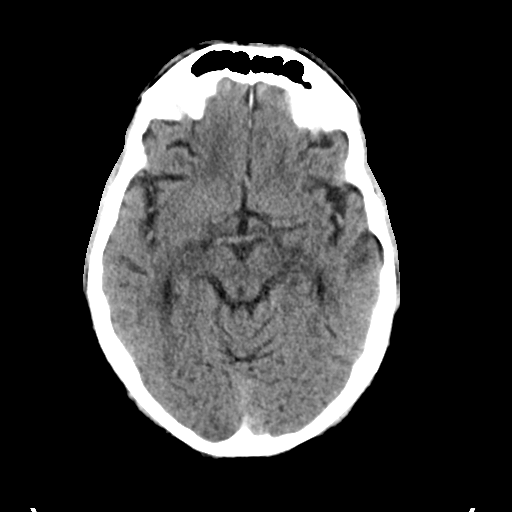
[im 17/33  brain]
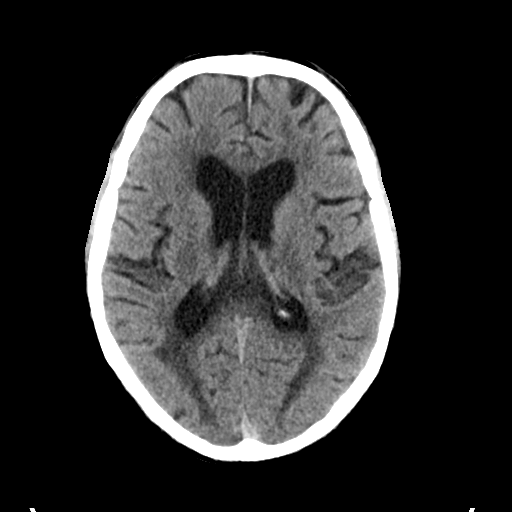
[im 17/33  bone]
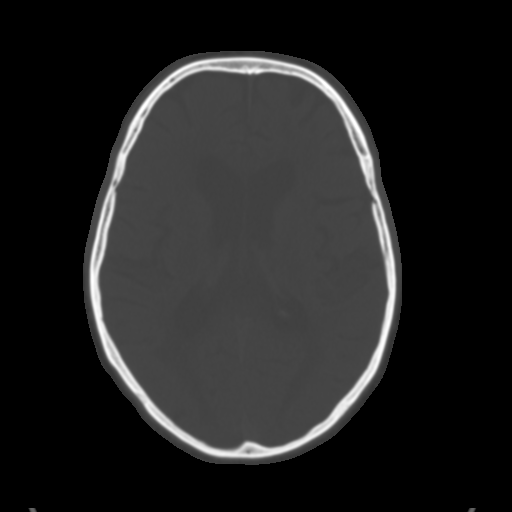
[im 20/33  brain]
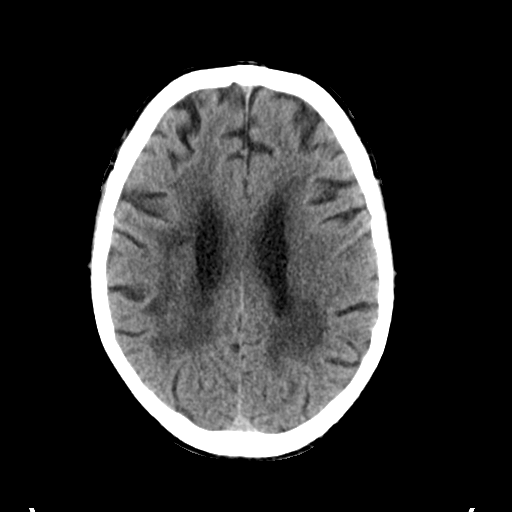
[im 24/33  brain]
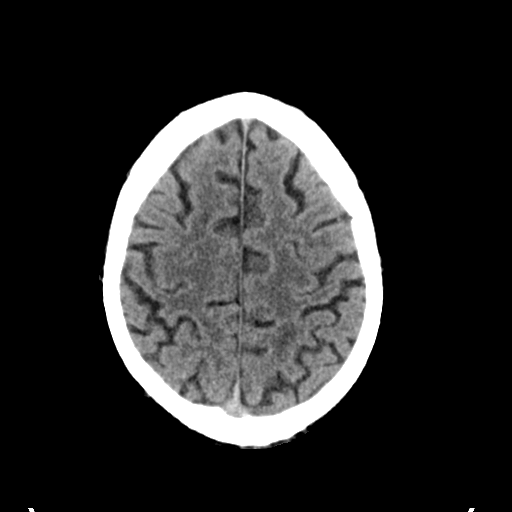
[im 27/33  brain]
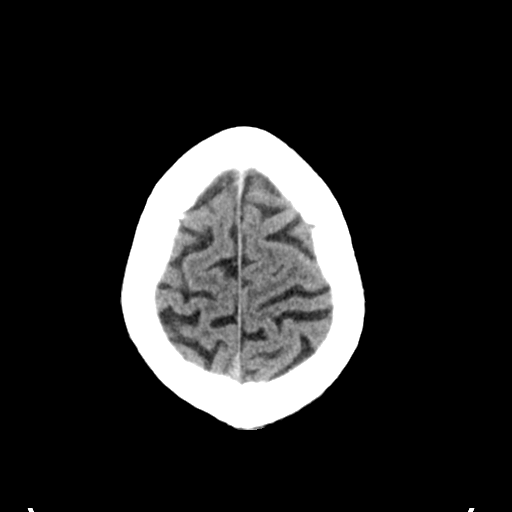
[im 30/33  brain]
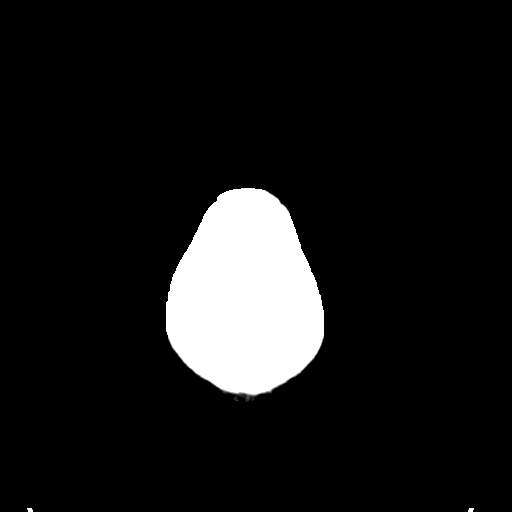
[im 30/33  bone]
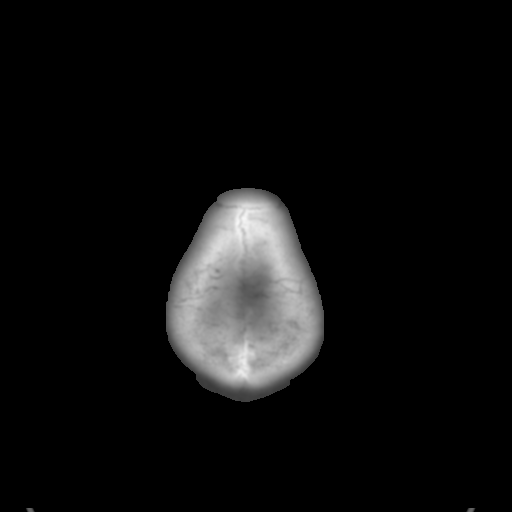

[Series 4: coronal soft tissue · coronal · 0.35mm/px · 3 of 77 slices shown]
[im 26/77  brain]
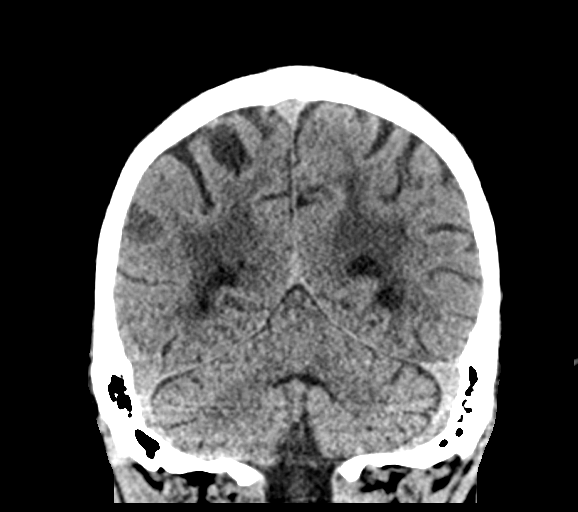
[im 34/77  brain]
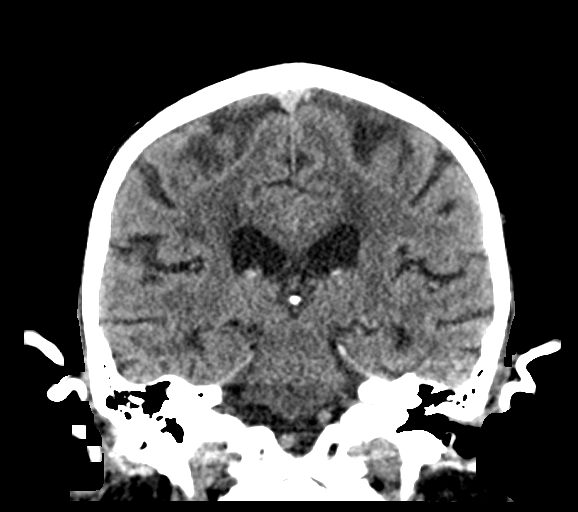
[im 43/77  brain]
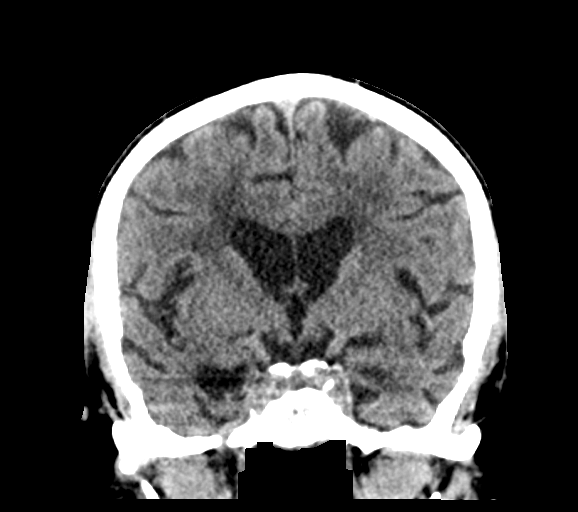

[Series 5: sagittal soft tissue · sagittal · 0.35mm/px · 3 of 67 slices shown]
[im 23/67  brain]
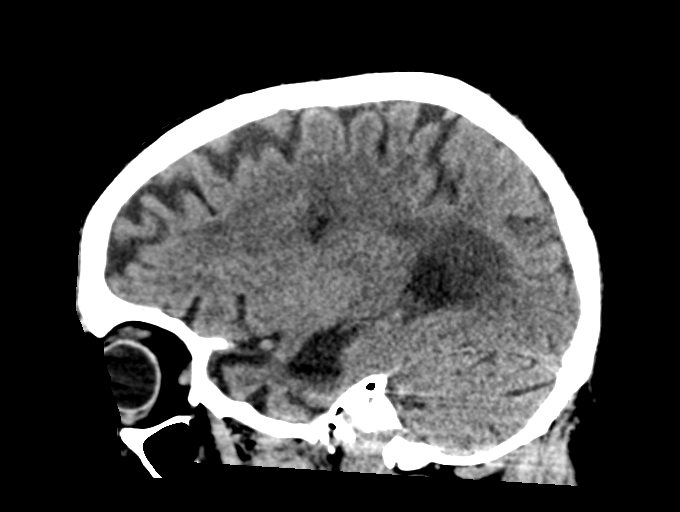
[im 34/67  brain]
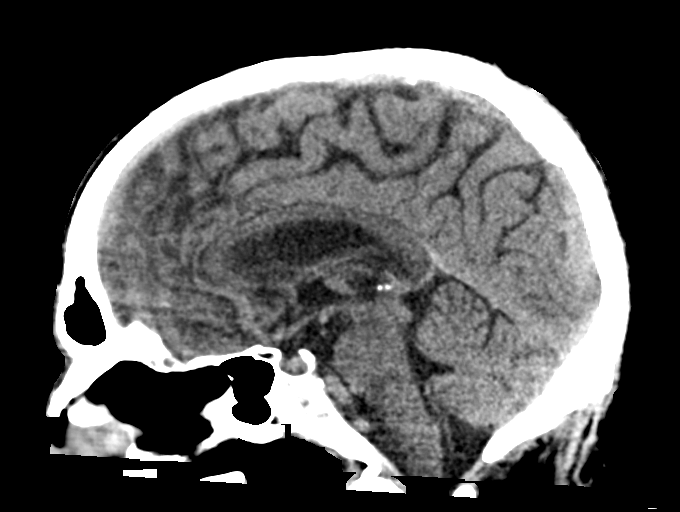
[im 45/67  brain]
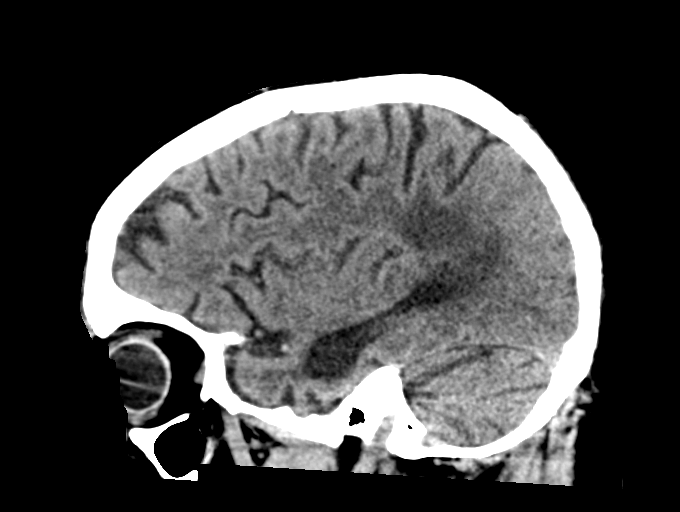

[15 of 47 positions shown; findings below may reference images not displayed]

FINDINGS: Brain: No evidence of acute infarction, hemorrhage, hydrocephalus,
extra-axial collection or mass lesion/mass effect. Chronic left
thalamic and lentiform nucleus lacunar infarction. Extensive chronic
bilateral cerebral white matter disease with chronic bilateral white
matter infarctions.

Vascular: Calcified atherosclerosis of the vertebrobasilar arterial
system and bilateral carotid siphons. No dense vessel sign.

Skull: Small benign left parietal hemangioma, unchanged. No skull
fracture. Mild demineralization at the skull base.

Sinuses/Orbits: Minimal bilateral ethmoidal mucosal thickening.
Normal orbital soft tissues.

Other: Debris, probably cerumen in the bilateral external auditory
canals.
IMPRESSION: 1. No CT evidence of acute intracranial abnormality.
2. Chronic microvascular ischemic disease with chronic left thalamic
and lentiform nucleus lacunar infarctions and associated
intracranial atherosclerosis.
3. Cerumen in the bilateral external auditory canals.
4. Minimal bilateral ethmoidal mucoperiosteal thickening.

## 2022-02-10 DIAGNOSIS — L84 Corns and callosities: Secondary | ICD-10-CM | POA: Diagnosis not present

## 2022-02-10 DIAGNOSIS — E1159 Type 2 diabetes mellitus with other circulatory complications: Secondary | ICD-10-CM | POA: Diagnosis not present

## 2022-02-10 DIAGNOSIS — B351 Tinea unguium: Secondary | ICD-10-CM | POA: Diagnosis not present

## 2022-03-03 ENCOUNTER — Encounter: Payer: Self-pay | Admitting: Family Medicine

## 2022-03-03 ENCOUNTER — Non-Acute Institutional Stay (SKILLED_NURSING_FACILITY): Payer: Medicare PPO | Admitting: Family Medicine

## 2022-03-03 DIAGNOSIS — R627 Adult failure to thrive: Secondary | ICD-10-CM

## 2022-03-03 DIAGNOSIS — F01C0 Vascular dementia, severe, without behavioral disturbance, psychotic disturbance, mood disturbance, and anxiety: Secondary | ICD-10-CM

## 2022-03-03 DIAGNOSIS — I10 Essential (primary) hypertension: Secondary | ICD-10-CM

## 2022-03-03 DIAGNOSIS — E1159 Type 2 diabetes mellitus with other circulatory complications: Secondary | ICD-10-CM

## 2022-03-03 NOTE — Progress Notes (Signed)
Location:  [facility] Room Number: 27-A Place of Service:  SNF [phone]) Provider:  Lillette Boxer. Josimar Corning,MD  Mast, Man X, NP  Patient Care Team: Mast, Man X, NP as PCP - General (Internal Medicine) [name], MD as PCP - Cardiology (Cardiology) [name], MD as Consulting Physician (Cardiology)  Extended Emergency Contact Information Primary Emergency Contact: Ploeger,[name] Address: [address]          [place], [place] [address] [name] of [place] Phone: [phone] Mobile Phone: [phone] Relation: Spouse  Code Status:  DNR Goals of care: Advanced Directive information    03/03/2022   11:27 AM  Advanced Directives  Does Patient Have a Medical Advance Directive? Yes  Type of Advance Directive Out of facility DNR (pink MOST or yellow form)  Does patient want to make changes to medical advance directive? No - Patient declined  Pre-existing out of facility DNR order (yellow form or pink MOST form) Pink MOST/Yellow Form most recent copy in chart - Physician notified to receive inpatient order     Chief Complaint  Patient presents with   Routine    HPI:  Pt is a 81 y.o. male seen today for medical management of chronic diseases, including dementia, hypertension, type 2 diabetes and history of adult failure to thrive.  Had formally been on hospice but discharged due to stabilization of weight.  Patient is nonambulatory and has aphasia Patient is poorly responsive and family has said as much. Have been no behavioral or nursing issues recently blood sugars do fluctuate but he is on a sliding scale to cover markedly elevated sugars   Past Medical History:  Diagnosis Date   Aortic regurgitation    mild by echo 08/2019   Carotid artery stenosis    1-39% bilateral stenosis by dopplers 08/2019   Cataract    Complication of anesthesia    "high blood sugar; he's been loopy last 36h since OR"- 2016   Diabetes mellitus    DKA, type  2 ([place])    Hearing loss    Hypercholesteremia    Hypertension    Insulin pump in place    Macular degeneration    MCI (mild cognitive impairment) with memory loss 12/12/2014   OSA on CPAP 09/11/2014   PONV (postoperative nausea and vomiting)    Uses hearing aid    right ear, patient stated he lost left hearing aid    Past Surgical History:  Procedure Laterality Date   CHOLECYSTECTOMY N/A 10/28/2015   Procedure: LAPAROSCOPIC CHOLECYSTECTOMY WITH   INTRAOPERATIVE CHOLANGIOGRAM;  Surgeon: Greer Pickerel, MD;  Location: [facility];  Service: General;  Laterality: N/A;   SHOULDER ARTHROSCOPY Right 08/24/11   "frozen shoulder; RCR w/labrum tear; arthritis scraping; bone spurs"   SHOULDER ARTHROSCOPY Left ~ 2006   TONSILLECTOMY  1949   "in childhood"    No Known Allergies  Outpatient Encounter Medications as of 03/03/2022  Medication Sig   amLODipine (NORVASC) 10 MG tablet Take 10 mg by mouth daily.   aspirin EC 81 MG tablet Take 81 mg by mouth daily.   insulin aspart (NOVOLOG) 100 UNIT/ML injection If Blood Sugar is 151 to 300, give 5 Units. If Blood Sugar is 301 to 450, give 10 Units. If Blood Sugar is greater than 450, call MD. Subcutaneous, Three Times A Day Before Meals   levothyroxine (SYNTHROID) 25 MCG tablet Take 25 mcg by mouth daily before breakfast.   lisinopril (ZESTRIL) 30 MG tablet Take  30 mg by mouth daily.   nystatin (MYCOSTATIN/NYSTOP) powder Apply 1 application topically 3 (three) times daily. Apply to right and left buttocks with incontinent care every shift   nystatin-triamcinolone (MYCOLOG II) cream Apply 1 application  topically 2 (two) times daily. Apply to right/left buttocks and any redden area until healed   TRESIBA FLEXTOUCH 100 UNIT/ML FlexTouch Pen Inject 18 Units into the skin in the morning.   No facility-administered encounter medications on file as of 03/03/2022.    Review of Systems  Unable to perform ROS: Dementia    Immunization History  Administered  Date(s) Administered   DTaP, 5 pertussis antigens 09/07/2016   Influenza Split 05/09/2010, 03/02/2011, 03/21/2012, 04/06/2013, 03/26/2014, 04/03/2020   Influenza, High Dose Seasonal PF 04/06/2013, 04/02/2016, 03/31/2017   Influenza, Quadrivalent, Recombinant, Inj, Pf 03/14/2018, 03/06/2019, 05/15/2020   Influenza,inj,Quad PF,6+ Mos 05/01/2015   Influenza-Unspecified 04/09/2021   Moderna SARS-COV2 Booster Vaccination 04/30/2020, 11/19/2020, 11/07/2021   Moderna Sars-Covid-2 Vaccination 06/26/2019, 07/24/2019   PFIZER(Purple Top)SARS-COV-2 Vaccination 04/09/2021   Pfizer Covid-19 Vaccine Bivalent Booster 10yr & up 03/11/2021   Pneumococcal Conjugate-13 01/31/2014, 08/22/2015   Pneumococcal Polysaccharide-23 05/21/2008, 05/23/2009   Td 05/18/2007, 05/23/2009   Tdap 03/03/2012   Zoster Recombinat (Shingrix) 09/26/2021   Zoster, Live 05/21/2008, 05/23/2009   Pertinent  Health Maintenance Due  Topic Date Due   OPHTHALMOLOGY EXAM  Never done   INFLUENZA VACCINE  01/20/2022   COLONOSCOPY (Pts 45-463yrInsurance coverage will need to be confirmed)  06/18/2022 (Originally 04/13/2019)   HEMOGLOBIN A1C  05/08/2022   FOOT EXAM  09/23/2022      07/25/2019   11:38 PM 08/28/2019    1:47 PM 07/20/2020    2:00 PM 07/21/2020    4:00 AM 07/21/2020   10:00 AM  Fall Risk  Patient Fall Risk Level Moderate fall risk High fall risk High fall risk High fall risk High fall risk   Functional Status Survey:    Vitals:   03/03/22 1122  BP: 116/65  Pulse: 68  Resp: 20  Temp: 98.1 F (36.7 C)  SpO2: 96%  Weight: 164 lb 3.2 oz (74.5 kg)  Height: '5\' 5"'$  (1.651 m)   Body mass index is 27.32 kg/m. Physical Exam Vitals and nursing note reviewed.  Constitutional:      Comments: Patient very somnolent and does not respond to noxious stimulus.  There are no medications which would be implicated in his responsiveness or lack  Cardiovascular:     Rate and Rhythm: Normal rate and regular rhythm.  Pulmonary:      Effort: Pulmonary effort is normal.     Breath sounds: Normal breath sounds.  Abdominal:     General: Abdomen is flat. Bowel sounds are normal.     Palpations: Abdomen is soft.  Neurological:     General: No focal deficit present.     Comments: Cannot evaluate orientation  Psychiatric:     Comments: Unable to evaluate     Labs reviewed: Recent Labs    03/11/21 0000 05/29/21 0000 06/05/21 0000 11/05/21 0000  NA 140 148* 146 145  K 4.5 3.6 3.7 3.8  CL 107 112* 110* 110*  CO2 27* 24* 30* 29*  BUN '14 17 14 16  '$ CREATININE 1.0 0.9 0.9 0.9  CALCIUM 8.8 8.9  --  8.5*   Recent Labs    05/29/21 0000 11/05/21 0000  AST 11* 12*  ALT 8* 9*  ALKPHOS 88 92  ALBUMIN 3.3* 3.3*   Recent Labs  05/29/21 0000 06/26/21 0000 11/05/21 0000  WBC 5.6 7.2 5.2  NEUTROABS 3,584.00 3,708.00 2,413.00  HGB 13.7 12.7* 13.7  HCT 40* 38* 40*  PLT 182 215 178   Lab Results  Component Value Date   TSH 4.26 05/29/2021   Lab Results  Component Value Date   HGBA1C 8.3 11/05/2021   No results found for: "CHOL", "HDL", "LDLCALC", "LDLDIRECT", "TRIG", "CHOLHDL"  Significant Diagnostic Results in last 30 days:  No results found.  Assessment/Plan 1. Adult failure to thrive Weights are stable.  Patient has been discharged from hospice care  2. Primary hypertension Review of nurses notes shows blood pressure to also be stable  3. Type 2 diabetes mellitus with vascular disease ([place]) Sugars fluctuate wildly.  Last A1c is 8.3.  This is probably acceptable given other morbidities  4. Severe vascular dementia without behavioral disturbance, psychotic disturbance, mood disturbance, or anxiety (HCC) No behavioral difficulties.  Patient seems to be progressing.  On no medications     Family/ staff Communication:   Labs/tests ordered:

## 2022-03-16 ENCOUNTER — Non-Acute Institutional Stay (SKILLED_NURSING_FACILITY): Payer: Medicare PPO | Admitting: Nurse Practitioner

## 2022-03-16 ENCOUNTER — Encounter: Payer: Self-pay | Admitting: Nurse Practitioner

## 2022-03-16 DIAGNOSIS — R55 Syncope and collapse: Secondary | ICD-10-CM | POA: Diagnosis not present

## 2022-03-16 DIAGNOSIS — E039 Hypothyroidism, unspecified: Secondary | ICD-10-CM

## 2022-03-16 DIAGNOSIS — I10 Essential (primary) hypertension: Secondary | ICD-10-CM | POA: Diagnosis not present

## 2022-03-16 DIAGNOSIS — F01C0 Vascular dementia, severe, without behavioral disturbance, psychotic disturbance, mood disturbance, and anxiety: Secondary | ICD-10-CM | POA: Diagnosis not present

## 2022-03-16 DIAGNOSIS — E1159 Type 2 diabetes mellitus with other circulatory complications: Secondary | ICD-10-CM

## 2022-03-16 DIAGNOSIS — R627 Adult failure to thrive: Secondary | ICD-10-CM | POA: Diagnosis not present

## 2022-03-16 DIAGNOSIS — R238 Other skin changes: Secondary | ICD-10-CM | POA: Diagnosis not present

## 2022-03-16 NOTE — Assessment & Plan Note (Signed)
takes Levothyroxine, TSH 4.26 05/29/21 

## 2022-03-16 NOTE — Assessment & Plan Note (Signed)
weight stabilized.

## 2022-03-16 NOTE — Assessment & Plan Note (Addendum)
Scabbed over area less than 1x1cm  left buttock, no s/s of infection. Pressure reduction: assist the patient with frequent repositioning and incontinent care. Skin barrier oint with every incontinent care. Observe.

## 2022-03-16 NOTE — Assessment & Plan Note (Signed)
Hgb a1c 8.3 11/05/21,  hs snack to avoid low CBG in am. On Tresiba,  Novolog coverage 

## 2022-03-16 NOTE — Assessment & Plan Note (Signed)
stopped Labetalol, no recurrence reported 

## 2022-03-16 NOTE — Assessment & Plan Note (Signed)
Blood pressure is controlled,  takes Amlodipine, Lisinopril Bun/creat 16/0.9 11/05/21           

## 2022-03-16 NOTE — Assessment & Plan Note (Signed)
off Donepezil, SNF [facility] for supportive care  

## 2022-03-16 NOTE — Progress Notes (Unsigned)
Location:   SNF [facility] Room Number: 027 Place of Service:  SNF (31) Provider: Lennie Odor Edman Lipsey NP  Mckaylin Bastien X, NP  Patient Care Team: Rudolfo Brandow X, NP as PCP - General (Internal Medicine) [name], MD as PCP - Cardiology (Cardiology) [name], MD as Consulting Physician (Cardiology)  Extended Emergency Contact Information Primary Emergency Contact: Pressey,[name] Address: [address]          [place],  [address] [place] Phone: [phone] Mobile Phone: [phone] Relation: Spouse  Code Status: DNR Goals of care: Advanced Directive information    03/16/2022    3:52 PM  Advanced Directives  Does Patient Have a Medical Advance Directive? Yes  Type of Advance Directive Out of facility DNR (pink MOST or yellow form)  Does patient want to make changes to medical advance directive? No - Patient declined  Pre-existing out of facility DNR order (yellow form or pink MOST form) Pink Most/Yellow Form available - Physician notified to receive inpatient order;Yellow form placed in chart (order not valid for inpatient use)     Chief Complaint  Patient presents with   Acute Visit    Pressure ulcer    HPI:  Pt is a 81 y.o. male seen today for an acute visit for reported pressure sore left buttock, no s/s of infection.     Adult Failure to Thrive, weight stabilized.               T2DM Hgb a1c 8.3 11/05/21,  hs snack to avoid low CBG in am. On Tresiba,  Novolog coverage             Syncope, stopped Labetalol, no recurrence reported             HTN, takes Amlodipine, Lisinopril Bun/creat 16/0.9 11/05/21             Hypothyroidism, takes Levothyroxine, TSH 4.26 05/29/21             Dementia, off Donepezil, SNF [facility] for supportive care             Hypernatremia, Na 145 11/05/21    Past Medical History:  Diagnosis Date   Aortic regurgitation    mild by echo 08/2019   Carotid artery stenosis    1-39% bilateral stenosis by dopplers  08/2019   Cataract    Complication of anesthesia    "high blood sugar; he's been loopy last 36h since OR"- 2016   Diabetes mellitus    DKA, type 2 ([place])    Hearing loss    Hypercholesteremia    Hypertension    Insulin pump in place    Macular degeneration    MCI (mild cognitive impairment) with memory loss 12/12/2014   OSA on CPAP 09/11/2014   PONV (postoperative nausea and vomiting)    Uses hearing aid    right ear, patient stated he lost left hearing aid    Past Surgical History:  Procedure Laterality Date   CHOLECYSTECTOMY N/A 10/28/2015   Procedure: LAPAROSCOPIC CHOLECYSTECTOMY WITH   INTRAOPERATIVE CHOLANGIOGRAM;  Surgeon: Greer Pickerel, MD;  Location: [facility];  Service: General;  Laterality: N/A;   SHOULDER ARTHROSCOPY Right 08/24/11   "frozen shoulder; RCR w/labrum tear; arthritis scraping; bone spurs"   SHOULDER ARTHROSCOPY Left ~ 2006   TONSILLECTOMY  1949   "in childhood"    No Known Allergies  Allergies as of 03/16/2022   No Known Allergies      Medication  List        Accurate as of March 16, 2022 11:59 PM. If you have any questions, ask your nurse or doctor.          amLODipine 10 MG tablet Commonly known as: NORVASC Take 10 mg by mouth daily.   aspirin EC 81 MG tablet Take 81 mg by mouth daily.   insulin aspart 100 UNIT/ML injection Commonly known as: novoLOG If Blood Sugar is 151 to 300, give 5 Units. If Blood Sugar is 301 to 450, give 10 Units. If Blood Sugar is greater than 450, call MD. Subcutaneous, Three Times A Day Before Meals   levothyroxine 25 MCG tablet Commonly known as: SYNTHROID Take 25 mcg by mouth daily before breakfast.   lisinopril 30 MG tablet Commonly known as: ZESTRIL Take 30 mg by mouth daily.   nystatin powder Commonly known as: MYCOSTATIN/NYSTOP Apply 1 application topically 3 (three) times daily. Apply to right and left buttocks with incontinent care every shift   nystatin-triamcinolone cream Commonly known as:  MYCOLOG II Apply 1 application  topically 2 (two) times daily. Apply to right/left buttocks and any redden area until healed   Tresiba FlexTouch 100 UNIT/ML FlexTouch Pen Generic drug: insulin degludec Inject 18 Units into the skin in the morning.        Review of Systems  Unable to perform ROS: Dementia    Immunization History  Administered Date(s) Administered   DTaP, 5 pertussis antigens 09/07/2016   Influenza Split 05/09/2010, 03/02/2011, 03/21/2012, 04/06/2013, 03/26/2014, 04/03/2020   Influenza, High Dose Seasonal PF 04/06/2013, 04/02/2016, 03/31/2017   Influenza, Quadrivalent, Recombinant, Inj, Pf 03/14/2018, 03/06/2019, 05/15/2020   Influenza,inj,Quad PF,6+ Mos 05/01/2015   Influenza-Unspecified 04/09/2021   Moderna SARS-COV2 Booster Vaccination 04/30/2020, 11/19/2020, 11/07/2021   Moderna Sars-Covid-2 Vaccination 06/26/2019, 07/24/2019   PFIZER(Purple Top)SARS-COV-2 Vaccination 04/09/2021   Pfizer Covid-19 Vaccine Bivalent Booster 29yr & up 03/11/2021   Pneumococcal Conjugate-13 01/31/2014, 08/22/2015   Pneumococcal Polysaccharide-23 05/21/2008, 05/23/2009   Td 05/18/2007, 05/23/2009   Tdap 03/03/2012   Zoster Recombinat (Shingrix) 09/26/2021   Zoster, Live 05/21/2008, 05/23/2009   Pertinent  Health Maintenance Due  Topic Date Due   OPHTHALMOLOGY EXAM  Never done   INFLUENZA VACCINE  01/20/2022   COLONOSCOPY (Pts 45-410yrInsurance coverage will need to be confirmed)  06/18/2022 (Originally 04/13/2019)   HEMOGLOBIN A1C  05/08/2022   FOOT EXAM  09/23/2022      07/25/2019   11:38 PM 08/28/2019    1:47 PM 07/20/2020    2:00 PM 07/21/2020    4:00 AM 07/21/2020   10:00 AM  Fall Risk  Patient Fall Risk Level Moderate fall risk High fall risk High fall risk High fall risk High fall risk   Functional Status Survey:    Vitals:   03/16/22 1554  BP: (!) 146/75  Pulse: 76  Resp: 20  Temp: 97.8 F (36.6 C)  SpO2: 97%  Weight: 164 lb 3.2 oz (74.5 kg)  Height: 5'  5" (1.651 m)   Body mass index is 27.32 kg/m. Physical Exam Vitals and nursing note reviewed.  Constitutional:      Appearance: Normal appearance.  HENT:     Head: Normocephalic and atraumatic.     Mouth/Throat:     Mouth: Mucous membranes are moist.  Eyes:     Extraocular Movements: Extraocular movements intact.     Conjunctiva/sclera: Conjunctivae normal.     Pupils: Pupils are equal, round, and reactive to light.  Cardiovascular:     Rate  and Rhythm: Normal rate and regular rhythm.     Heart sounds: No murmur heard. Pulmonary:     Effort: Pulmonary effort is normal.     Breath sounds: No rales.  Abdominal:     General: Bowel sounds are normal.     Palpations: Abdomen is soft.  Musculoskeletal:     Cervical back: Normal range of motion and neck supple.     Right lower leg: No edema.     Left lower leg: No edema.  Skin:    General: Skin is warm and dry.     Comments: Dark red discoloration of R+L buttocks and sacrum. Scattered cherry angioma chest, abd, back, BLE R>L, angioma. From previous examination. Left buttock scabbed over area, no active bleeding or s/s of infection.     Neurological:     General: No focal deficit present.     Mental Status: He is alert.     Motor: No weakness.     Coordination: Coordination normal.     Gait: Gait abnormal.     Comments: Oriented to self.   Psychiatric:     Comments: Alert, smiled.      Labs reviewed: Recent Labs    05/29/21 0000 06/05/21 0000 11/05/21 0000  NA 148* 146 145  K 3.6 3.7 3.8  CL 112* 110* 110*  CO2 24* 30* 29*  BUN '17 14 16  '$ CREATININE 0.9 0.9 0.9  CALCIUM 8.9  --  8.5*   Recent Labs    05/29/21 0000 11/05/21 0000  AST 11* 12*  ALT 8* 9*  ALKPHOS 88 92  ALBUMIN 3.3* 3.3*   Recent Labs    05/29/21 0000 06/26/21 0000 11/05/21 0000  WBC 5.6 7.2 5.2  NEUTROABS 3,584.00 3,708.00 2,413.00  HGB 13.7 12.7* 13.7  HCT 40* 38* 40*  PLT 182 215 178   Lab Results  Component Value Date   TSH  4.26 05/29/2021   Lab Results  Component Value Date   HGBA1C 8.3 11/05/2021   No results found for: "CHOL", "HDL", "LDLCALC", "LDLDIRECT", "TRIG", "CHOLHDL"  Significant Diagnostic Results in last 30 days:  No results found.  Assessment/Plan: Skin irritation Scabbed over area less than 1x1cm  left buttock, no s/s of infection. Pressure reduction: assist the patient with frequent repositioning and incontinent care. Skin barrier oint with every incontinent care. Observe.    Adult failure to thrive weight stabilized.   Type 2 diabetes mellitus with vascular disease (HCC)  Hgb a1c 8.3 11/05/21,  hs snack to avoid low CBG in am. On Tresiba,  Novolog coverage  Syncope and collapse stopped Labetalol, no recurrence reported  HTN (hypertension) Blood pressure is controlled,  takes Amlodipine, Lisinopril Bun/creat 16/0.9 11/05/21              Hypothyroidism takes Levothyroxine, TSH 4.26 05/29/21  Vascular dementia ([place]) off Donepezil, SNF [facility] for supportive care    Family/ staff Communication: plan of care reviewed with the patient and charge nurse.   Labs/tests ordered:  none  Time spend 25 minutes.

## 2022-03-17 ENCOUNTER — Encounter: Payer: Self-pay | Admitting: Nurse Practitioner

## 2022-03-17 NOTE — Progress Notes (Unsigned)
Location: [place]     Place of Service:  [facility]  Provider:  Mast, Man X, NP  Patient Care Team: Mast, Man X, NP as PCP - General (Internal Medicine) [name], MD as PCP - Cardiology (Cardiology) [name], MD as Consulting Physician (Cardiology)  Extended Emergency Contact Information Primary Emergency Contact: Laborde,[name] Address: [address]          [place], [place] [address] [name] of [place] Phone: [phone] Mobile Phone: [phone] Relation: Spouse  Code Status:  DNR   Managed Care Goals of care: Advanced Directive information    03/16/2022    3:52 PM  Advanced Directives  Does Patient Have a Medical Advance Directive? Yes  Type of Advance Directive Out of facility DNR (pink MOST or yellow form)  Does patient want to make changes to medical advance directive? No - Patient declined  Pre-existing out of facility DNR order (yellow form or pink MOST form) Pink Most/Yellow Form available - Physician notified to receive inpatient order;Yellow form placed in chart (order not valid for inpatient use)     No chief complaint on file.   HPI:  Pt is a 81 y.o. male seen today for an acute visit for    Past Medical History:  Diagnosis Date   Aortic regurgitation    mild by echo 08/2019   Carotid artery stenosis    1-39% bilateral stenosis by dopplers 08/2019   Cataract    Complication of anesthesia    "high blood sugar; he's been loopy last 36h since OR"- 2016   Diabetes mellitus    DKA, type 2 ([place])    Hearing loss    Hypercholesteremia    Hypertension    Insulin pump in place    Macular degeneration    MCI (mild cognitive impairment) with memory loss 12/12/2014   OSA on CPAP 09/11/2014   PONV (postoperative nausea and vomiting)    Uses hearing aid    right ear, patient stated he lost left hearing aid    Past Surgical History:  Procedure Laterality Date   CHOLECYSTECTOMY N/A 10/28/2015    Procedure: LAPAROSCOPIC CHOLECYSTECTOMY WITH   INTRAOPERATIVE CHOLANGIOGRAM;  Surgeon: Greer Pickerel, MD;  Location: [facility];  Service: General;  Laterality: N/A;   SHOULDER ARTHROSCOPY Right 08/24/11   "frozen shoulder; RCR w/labrum tear; arthritis scraping; bone spurs"   SHOULDER ARTHROSCOPY Left ~ 2006   TONSILLECTOMY  1949   "in childhood"    No Known Allergies  Allergies as of 03/16/2022   No Known Allergies      Medication List        Accurate as of March 16, 2022 11:59 PM. If you have any questions, ask your nurse or doctor.          amLODipine 10 MG tablet Commonly known as: NORVASC Take 10 mg by mouth daily.   aspirin EC 81 MG tablet Take 81 mg by mouth daily.   insulin aspart 100 UNIT/ML injection Commonly known as: novoLOG If Blood Sugar is 151 to 300, give 5 Units. If Blood Sugar is 301 to 450, give 10 Units. If Blood Sugar is greater than 450, call MD. Subcutaneous, Three Times A Day Before Meals   levothyroxine 25 MCG tablet Commonly known as: SYNTHROID Take 25 mcg by mouth daily before breakfast.   lisinopril 30 MG tablet Commonly known as: ZESTRIL Take 30 mg by mouth daily.   nystatin powder Commonly known as:  MYCOSTATIN/NYSTOP Apply 1 application topically 3 (three) times daily. Apply to right and left buttocks with incontinent care every shift   nystatin-triamcinolone cream Commonly known as: MYCOLOG II Apply 1 application  topically 2 (two) times daily. Apply to right/left buttocks and any redden area until healed   Tresiba FlexTouch 100 UNIT/ML FlexTouch Pen Generic drug: insulin degludec Inject 18 Units into the skin in the morning.        Review of Systems  Immunization History  Administered Date(s) Administered   DTaP, 5 pertussis antigens 09/07/2016   Influenza Split 05/09/2010, 03/02/2011, 03/21/2012, 04/06/2013, 03/26/2014, 04/03/2020   Influenza, High Dose Seasonal PF 04/06/2013, 04/02/2016, 03/31/2017   Influenza,  Quadrivalent, Recombinant, Inj, Pf 03/14/2018, 03/06/2019, 05/15/2020   Influenza,inj,Quad PF,6+ Mos 05/01/2015   Influenza-Unspecified 04/09/2021   Moderna SARS-COV2 Booster Vaccination 04/30/2020, 11/19/2020, 11/07/2021   Moderna Sars-Covid-2 Vaccination 06/26/2019, 07/24/2019   PFIZER(Purple Top)SARS-COV-2 Vaccination 04/09/2021   Pfizer Covid-19 Vaccine Bivalent Booster 49yr & up 03/11/2021   Pneumococcal Conjugate-13 01/31/2014, 08/22/2015   Pneumococcal Polysaccharide-23 05/21/2008, 05/23/2009   Td 05/18/2007, 05/23/2009   Tdap 03/03/2012   Zoster Recombinat (Shingrix) 09/26/2021   Zoster, Live 05/21/2008, 05/23/2009   Pertinent  Health Maintenance Due  Topic Date Due   OPHTHALMOLOGY EXAM  Never done   INFLUENZA VACCINE  01/20/2022   COLONOSCOPY (Pts 45-461yrInsurance coverage will need to be confirmed)  06/18/2022 (Originally 04/13/2019)   HEMOGLOBIN A1C  05/08/2022   FOOT EXAM  09/23/2022      07/25/2019   11:38 PM 08/28/2019    1:47 PM 07/20/2020    2:00 PM 07/21/2020    4:00 AM 07/21/2020   10:00 AM  Fall Risk  Patient Fall Risk Level Moderate fall risk High fall risk High fall risk High fall risk High fall risk   Functional Status Survey:    There were no vitals filed for this visit. There is no height or weight on file to calculate BMI. Physical Exam  Labs reviewed: Recent Labs    05/29/21 0000 06/05/21 0000 11/05/21 0000  NA 148* 146 145  K 3.6 3.7 3.8  CL 112* 110* 110*  CO2 24* 30* 29*  BUN '17 14 16  '$ CREATININE 0.9 0.9 0.9  CALCIUM 8.9  --  8.5*   Recent Labs    05/29/21 0000 11/05/21 0000  AST 11* 12*  ALT 8* 9*  ALKPHOS 88 92  ALBUMIN 3.3* 3.3*   Recent Labs    05/29/21 0000 06/26/21 0000 11/05/21 0000  WBC 5.6 7.2 5.2  NEUTROABS 3,584.00 3,708.00 2,413.00  HGB 13.7 12.7* 13.7  HCT 40* 38* 40*  PLT 182 215 178   Lab Results  Component Value Date   TSH 4.26 05/29/2021   Lab Results  Component Value Date   HGBA1C 8.3 11/05/2021    No results found for: "CHOL", "HDL", "LDLCALC", "LDLDIRECT", "TRIG", "CHOLHDL"  Significant Diagnostic Results in last 30 days:  No results found.  Assessment/Plan There are no diagnoses linked to this encounter.   Family/ staff Communication:   Labs/tests ordered:

## 2022-03-19 NOTE — Progress Notes (Signed)
This encounter was created in error - please disregard.

## 2022-03-31 ENCOUNTER — Encounter: Payer: Self-pay | Admitting: Nurse Practitioner

## 2022-03-31 ENCOUNTER — Non-Acute Institutional Stay (SKILLED_NURSING_FACILITY): Payer: Medicare PPO | Admitting: Nurse Practitioner

## 2022-03-31 DIAGNOSIS — F01C0 Vascular dementia, severe, without behavioral disturbance, psychotic disturbance, mood disturbance, and anxiety: Secondary | ICD-10-CM

## 2022-03-31 DIAGNOSIS — I10 Essential (primary) hypertension: Secondary | ICD-10-CM | POA: Diagnosis not present

## 2022-03-31 DIAGNOSIS — R55 Syncope and collapse: Secondary | ICD-10-CM

## 2022-03-31 DIAGNOSIS — E039 Hypothyroidism, unspecified: Secondary | ICD-10-CM | POA: Diagnosis not present

## 2022-03-31 DIAGNOSIS — E870 Hyperosmolality and hypernatremia: Secondary | ICD-10-CM

## 2022-03-31 DIAGNOSIS — E1159 Type 2 diabetes mellitus with other circulatory complications: Secondary | ICD-10-CM | POA: Diagnosis not present

## 2022-03-31 DIAGNOSIS — R627 Adult failure to thrive: Secondary | ICD-10-CM

## 2022-03-31 NOTE — Assessment & Plan Note (Signed)
Blood pressure is controlled,  takes Amlodipine, Lisinopril Bun/creat 16/0.9 11/05/21           

## 2022-03-31 NOTE — Assessment & Plan Note (Signed)
Na 145 11/05/21  

## 2022-03-31 NOTE — Assessment & Plan Note (Signed)
off Donepezil, SNF [facility] for supportive care  

## 2022-03-31 NOTE — Assessment & Plan Note (Signed)
weight stabilized.

## 2022-03-31 NOTE — Assessment & Plan Note (Signed)
stopped Labetalol, no recurrence reported 

## 2022-03-31 NOTE — Assessment & Plan Note (Signed)
Hgb a1c 8.3 11/05/21,  hs snack to avoid low CBG in am. On Tresiba,  Novolog coverage 

## 2022-03-31 NOTE — Progress Notes (Signed)
Location:  [facility]:  SNF (31)  Provider:  Rider Ermis X, NP  Patient Care Team: Quang Thorpe X, NP as PCP - General (Internal Medicine) [name], MD as PCP - Cardiology (Cardiology) [name], MD as Consulting Physician (Cardiology)  Extended Emergency Contact Information Primary Emergency Contact: Sgro,[name] Address: [address]          [place], [place] [address] [name] of [place] Phone: [phone] Mobile Phone: [phone] Relation: Spouse  Code Status:  DNR Palliative Care  Managed Care  Goals of care: Advanced Directive information    03/31/2022   11:22 AM  Advanced Directives  Does Patient Have a Medical Advance Directive? Yes  Type of Advance Directive Out of facility DNR (pink MOST or yellow form)  Does patient want to make changes to medical advance directive? No - Patient declined  Pre-existing out of facility DNR order (yellow form or pink MOST form) Pink Most/Yellow Form available - Physician notified to receive inpatient order;Yellow form placed in chart (order not valid for inpatient use)     Chief Complaint  Patient presents with   Medical Management of Chronic Issues    Routine, Ophthalmology exam, Diabetic kidney evaluation, Shingles, Flu, Tdap vaccines.    HPI:  Pt is a 81 y.o. male seen today for medical management of chronic diseases.     Adult Failure to Thrive, weight stabilized.              T2DM Hgb a1c 8.3 11/05/21,  hs snack to avoid low CBG in am. On Tresiba,  Novolog coverage             Syncope, stopped Labetalol, no recurrence reported             HTN, takes Amlodipine, Lisinopril Bun/creat 16/0.9 11/05/21             Hypothyroidism, takes Levothyroxine, TSH 4.26 05/29/21             Dementia, off Donepezil, SNF [facility] for supportive care             Hypernatremia, Na 145 11/05/21 Past Medical History:  Diagnosis Date   Aortic regurgitation    mild  by echo 08/2019   Carotid artery stenosis    1-39% bilateral stenosis by dopplers 08/2019   Cataract    Complication of anesthesia    "high blood sugar; he's been loopy last 36h since OR"- 2016   Diabetes mellitus    DKA, type 2 ([place])    Hearing loss    Hypercholesteremia    Hypertension    Insulin pump in place    Macular degeneration    MCI (mild cognitive impairment) with memory loss 12/12/2014   OSA on CPAP 09/11/2014   PONV (postoperative nausea and vomiting)    Uses hearing aid    right ear, patient stated he lost left hearing aid    Past Surgical History:  Procedure Laterality Date   CHOLECYSTECTOMY N/A 10/28/2015   Procedure: LAPAROSCOPIC CHOLECYSTECTOMY WITH   INTRAOPERATIVE CHOLANGIOGRAM;  Surgeon: Greer Pickerel, MD;  Location: [facility];  Service: General;  Laterality: N/A;   SHOULDER ARTHROSCOPY Right 08/24/11   "frozen shoulder; RCR w/labrum tear; arthritis scraping; bone spurs"   SHOULDER ARTHROSCOPY Left ~ 2006   TONSILLECTOMY  1949   "in childhood"    No Known Allergies  Allergies as of 03/31/2022   No Known Allergies  Medication List        Accurate as of March 31, 2022 11:59 PM. If you have any questions, ask your nurse or doctor.          amLODipine 10 MG tablet Commonly known as: NORVASC Take 10 mg by mouth daily.   aspirin EC 81 MG tablet Take 81 mg by mouth daily.   insulin aspart 100 UNIT/ML injection Commonly known as: novoLOG If Blood Sugar is 151 to 300, give 5 Units. If Blood Sugar is 301 to 450, give 10 Units. If Blood Sugar is greater than 450, call MD. Subcutaneous, Three Times A Day Before Meals   levothyroxine 25 MCG tablet Commonly known as: SYNTHROID Take 25 mcg by mouth daily before breakfast.   lisinopril 30 MG tablet Commonly known as: ZESTRIL Take 30 mg by mouth daily.   nystatin powder Commonly known as: MYCOSTATIN/NYSTOP Apply 1 application topically 3 (three) times daily. Apply to right and left buttocks with  incontinent care every shift   nystatin-triamcinolone cream Commonly known as: MYCOLOG II Apply 1 application  topically 2 (two) times daily. Apply to right/left buttocks and any redden area until healed   Tresiba FlexTouch 100 UNIT/ML FlexTouch Pen Generic drug: insulin degludec Inject 18 Units into the skin in the morning.        Review of Systems  Unable to perform ROS: Dementia    Immunization History  Administered Date(s) Administered   DTaP, 5 pertussis antigens 09/07/2016   Influenza Split 05/09/2010, 03/02/2011, 03/21/2012, 04/06/2013, 03/26/2014, 04/03/2020   Influenza, High Dose Seasonal PF 04/06/2013, 04/02/2016, 03/31/2017   Influenza, Quadrivalent, Recombinant, Inj, Pf 03/14/2018, 03/06/2019, 05/15/2020   Influenza,inj,Quad PF,6+ Mos 05/01/2015   Influenza-Unspecified 04/09/2021   Moderna SARS-COV2 Booster Vaccination 04/30/2020, 11/19/2020, 11/07/2021   Moderna Sars-Covid-2 Vaccination 06/26/2019, 07/24/2019   PFIZER(Purple Top)SARS-COV-2 Vaccination 04/09/2021   Pfizer Covid-19 Vaccine Bivalent Booster 31yr & up 03/11/2021   Pneumococcal Conjugate-13 01/31/2014, 08/22/2015   Pneumococcal Polysaccharide-23 05/21/2008, 05/23/2009   Td 05/18/2007, 05/23/2009   Tdap 03/03/2012   Zoster Recombinat (Shingrix) 09/26/2021   Zoster, Live 05/21/2008, 05/23/2009   Pertinent  Health Maintenance Due  Topic Date Due   OPHTHALMOLOGY EXAM  Never done   INFLUENZA VACCINE  01/20/2022   COLONOSCOPY (Pts 45-436yrInsurance coverage will need to be confirmed)  06/18/2022 (Originally 04/13/2019)   HEMOGLOBIN A1C  05/08/2022   FOOT EXAM  09/23/2022      07/25/2019   11:38 PM 08/28/2019    1:47 PM 07/20/2020    2:00 PM 07/21/2020    4:00 AM 07/21/2020   10:00 AM  Fall Risk  Patient Fall Risk Level Moderate fall risk High fall risk High fall risk High fall risk High fall risk   Functional Status Survey:    Vitals:   03/31/22 1112  BP: (!) 149/74  Pulse: 68  Resp: 17   Temp: 97.9 F (36.6 C)  SpO2: 98%  Weight: 158 lb 3.2 oz (71.8 kg)  Height: '5\' 5"'$  (1.651 m)   Body mass index is 26.33 kg/m. Physical Exam Vitals and nursing note reviewed.  Constitutional:      Appearance: Normal appearance.  HENT:     Head: Normocephalic and atraumatic.     Mouth/Throat:     Mouth: Mucous membranes are moist.  Eyes:     Extraocular Movements: Extraocular movements intact.     Conjunctiva/sclera: Conjunctivae normal.     Pupils: Pupils are equal, round, and reactive to light.  Cardiovascular:  Rate and Rhythm: Normal rate and regular rhythm.     Heart sounds: No murmur heard. Pulmonary:     Effort: Pulmonary effort is normal.     Breath sounds: No rales.  Abdominal:     General: Bowel sounds are normal.     Palpations: Abdomen is soft.  Musculoskeletal:     Cervical back: Normal range of motion and neck supple.     Right lower leg: No edema.     Left lower leg: No edema.  Skin:    General: Skin is warm and dry.     Comments: Dark red discoloration of R+L buttocks and sacrum. Scattered cherry angioma chest, abd, back, BLE R>L, angioma. From previous examination.    Neurological:     General: No focal deficit present.     Mental Status: He is alert.     Motor: No weakness.     Coordination: Coordination normal.     Gait: Gait abnormal.     Comments: Oriented to self.   Psychiatric:     Comments: Alert, smiled.      Labs reviewed: Recent Labs    05/29/21 0000 06/05/21 0000 11/05/21 0000  NA 148* 146 145  K 3.6 3.7 3.8  CL 112* 110* 110*  CO2 24* 30* 29*  BUN '17 14 16  '$ CREATININE 0.9 0.9 0.9  CALCIUM 8.9  --  8.5*   Recent Labs    05/29/21 0000 11/05/21 0000  AST 11* 12*  ALT 8* 9*  ALKPHOS 88 92  ALBUMIN 3.3* 3.3*   Recent Labs    05/29/21 0000 06/26/21 0000 11/05/21 0000  WBC 5.6 7.2 5.2  NEUTROABS 3,584.00 3,708.00 2,413.00  HGB 13.7 12.7* 13.7  HCT 40* 38* 40*  PLT 182 215 178   Lab Results  Component Value  Date   TSH 4.26 05/29/2021   Lab Results  Component Value Date   HGBA1C 8.3 11/05/2021   No results found for: "CHOL", "HDL", "LDLCALC", "LDLDIRECT", "TRIG", "CHOLHDL"  Significant Diagnostic Results in last 30 days:  No results found.  Assessment/Plan HTN (hypertension) Blood pressure is controlled, takes Amlodipine, Lisinopril Bun/creat 16/0.9 11/05/21              Hypothyroidism  takes Levothyroxine, TSH 4.26 05/29/21  Vascular dementia ([place])  off Donepezil, SNF [facility] for supportive care  Hypernatremia Na 145 11/05/21  Adult failure to thrive weight stabilized.   Type 2 diabetes mellitus with vascular disease (HCC) Hgb a1c 8.3 11/05/21,  hs snack to avoid low CBG in am. On Tresiba,  Novolog coverage  Syncope and collapse stopped Labetalol, no recurrence reported     Family/ staff Communication: plan of care reviewed with the patient and charge nurse.   Labs/tests ordered:  none  Time spend 25 minutes.

## 2022-03-31 NOTE — Assessment & Plan Note (Signed)
takes Levothyroxine, TSH 4.26 05/29/21 

## 2022-04-02 ENCOUNTER — Encounter: Payer: Self-pay | Admitting: Nurse Practitioner

## 2022-04-17 ENCOUNTER — Encounter: Payer: Self-pay | Admitting: Family Medicine

## 2022-04-17 ENCOUNTER — Non-Acute Institutional Stay: Payer: Medicare PPO | Admitting: Family Medicine

## 2022-04-17 VITALS — BP 112/68 | HR 75 | Resp 18 | Wt 164.2 lb

## 2022-04-17 DIAGNOSIS — F01C0 Vascular dementia, severe, without behavioral disturbance, psychotic disturbance, mood disturbance, and anxiety: Secondary | ICD-10-CM

## 2022-04-17 DIAGNOSIS — E1159 Type 2 diabetes mellitus with other circulatory complications: Secondary | ICD-10-CM

## 2022-04-17 NOTE — Progress Notes (Signed)
[facility] Consult Note Telephone: [phone]  Fax: [phone]    Date of encounter: 04/17/22 4:31 PM PATIENT NAME: Derrick [name] [place] [address]   [phone] (home) [phone] (work) DOB: 12-Sep-1940 MRN: [id number] PRIMARY CARE PROVIDER:    Mast, Man X, NP,  [address] [address] [phone]  REFERRING PROVIDER:   Mast, Man X, NP [address],  [place] [address] [phone]  RESPONSIBLE PARTY:    Contact Information     Name Relation Home Work [place] Spouse [phone]  [phone]        I met face to face with patient in AL facility.  Spoke with daughter Luan Moore by phone. Palliative Care was asked to follow this patient by consultation request of  Mast, Man X, NP to address advance care planning and complex medical decision making. This is a follow up visit   ASSESSMENT , SYMPTOM MANAGEMENT AND PLAN / RECOMMENDATIONS:  Severe Vascular Dementia FAST 7 score 7C Stable weight Continue to monitor and if function worsens and/or weight loss occurs, would consider recommending Hospice transition.  Type 2 DM with vascular disease Recommend goal HGB A1c 6.5-7%. Recommend AC Novolog: If blood sugar 151-200 give 3 units, 201-250 5 units, 251-300 7 units, 301-351 8 units, 351-400 give 10 units, 401-greater than 450 call MD Recommend increasing Tresiba Flextouch by 2 units weekly until fasting blood sugar under 150.  Advance Care Planning/Goals of Care: Goals include to maximize quality of life and symptom management.   Exploration of goals of care in the event of a sudden injury or illness-comfort only Identification of a healthcare agent-wife Camren Lipsett Review of existing  advance directive documents-DNR & MOST.  Added pt's DOB to MOST and signed review. Decision not to resuscitate or to de-escalate disease focused  treatments due to poor prognosis. CODE STATUS: DNR/DNI MOST as of 09/13/20: DNR/DNI with comfort measures No antibiotics (unless for comfort) and no IV fluids No feeding tube.      Follow up Palliative Care Visit: Palliative care will continue to follow for complex medical decision making, advance care planning, and clarification of goals. Return 4 weeks or prn.   This visit was coded based on medical decision making (MDM).  PPS: 40%  HOSPICE ELIGIBILITY/DIAGNOSIS: TBD  Chief Complaint:  Palliative Care is continuing to follow patient for chronic medical management in setting of dementia.   HISTORY OF PRESENT ILLNESS:  Derrick [name] is a 81 y.o. year old male with vascular dementia, hypertension, type 2 insulin-dependent diabetes with vascular disease, carotid artery stenosis, aortic regurgitation, sinus pause, OSA, hypothyroidism, frequent falls due to gait abnormality.  Facility staff states blood sugar 267 today and can be as high as 400s.  Pt is total care including being fed, bed/chairbound.  Med tech indicates that pt forgets that he is eating and won't feed himself so they do but his appetite is good. He is not coughing or choking after eating or drinking. He is incontinent of bowel and bladder.  She states that there is an area with a stage I that is just erythematous on his sacrum but the skin is not open.  She states they have noticed him scratching at the area. They are using Nystatin powder and barrier protective cream to protect the area.  She indicates he doesn't appear in pain, has had no falls and gets weighed about monthly. He is  resistive and combative with personal care.  On approach patient was sitting in Event organiser.  He did not respond to verbal communication but occasionally made eye contact and held onto provider's arm when checking blood pressure. Had received prior communication from patient's daughter Luan Moore.  She was questioning whether or not  patient had experienced significant decline, stating he was nonverbal with her and questioning weight loss with new area of skin breakdown.  She wanted to know if he would be potentially appropriate for hospice level of care.  After visit spoke with Ms. Lyle by phone.  Advised her that according to the staff patient was nonverbal however when wife visited and sang to him he would sing with her.  Facility staff indicated that skin over sacrum was mildly erythematous but had not broken through skin.  They were using nystatin powder and barrier protective cream to prevent further breakdown.  History obtained from review of EMR, questions from daughter, discussion with facility staff and/or Mr. Berrett.      Latest Ref Rng & Units 11/05/2021   12:00 AM 06/05/2021   12:00 AM 05/29/2021   12:00 AM  CMP  BUN 4 - _0 Creatinine 0.6 - 1.3 0.9     0.9     0.9      Sodium 137 - 147 145     146     148      Potassium 3.5 - 5.1 mEq/L 3.8     3.7     3.6      Chloride 99 - 108 110     110     112      CO2 13 - _1 Calcium 8.7 - 10.7 8.5      8.9      Alkaline Phos 25 - 125 92      88      AST 14 - 40 12      11      ALT 10 - 40 U/L 9      8         This result is from an external source.       Latest Ref Rng & Units 11/05/2021   12:00 AM 06/26/2021   12:00 AM 05/29/2021   12:00 AM  CBC  WBC  5.2     7.2     5.6      Hemoglobin 13.5 - 17.5 13.7     12.7     13.7      Hematocrit 41 - 53 40     38     40      Platelets 150 - 400 K/uL 178     215     182         This result is from an external source.    HGB A1c 11/05/21 8.3%  09/19/19 2d ECHO: Study Result  IMPRESSIONS     1. Left ventricular ejection fraction 60 to 65%. The  left ventricle has normal function wit no regional  wall motion abnormalities. There is mild left ventricular hypertrophy.  Left ventricular diastolic parameters are indeterminate.   2. Right ventricular systolic function and size  are normal. Tricuspid regurgitation signal is inadequate for assessing PA pressure.   3. The mitral valve is normal in  structure without mitral valve  regurgitation.   4. The aortic valve was not well visualized. Aortic valve regurgitation  is mild. No aortic stenosis is present.     I reviewed EMR for available labs, medications, imaging, studies and related documents.  There are no new records since last visit/Records reviewed and summarized above.   ROS Non-verbal with dementia  Physical Exam: Current and past weights: 164.2 lbs as of 02/20/22 Constitutional: NAD General: WN, mildly disheveled ENMT: intact hearing, oral mucous membranes moist CV: S1S2, RRR, no LE edema Pulmonary: CTAB, no increased work of breathing, no cough, room air Abdomen: hypo-active BS + 4 quadrants, soft and non tender MSK: no sarcopenia, moves all extremities, bed or chair bound Skin: warm and dry, no rashes or wounds on visible skin Neuro:  no generalized weakness,  appears aphasic uncertain if receptive and expressive, non-verbal Psych: non-anxious affect, A and O to self Hem/lymph/immuno: no widespread bruising   Thank you for the opportunity to participate in the care of Mr. Texidor.  The palliative care team will continue to follow. Please call our office at [phone] if we can be of additional assistance.   Marijo Conception, FNP -C  COVID-19 PATIENT SCREENING TOOL Asked and negative response unless otherwise noted:   Have you had symptoms of covid, tested positive or been in contact with someone with symptoms/positive test in the past 5-10 days?  no

## 2022-04-21 ENCOUNTER — Encounter: Payer: Self-pay | Admitting: Nurse Practitioner

## 2022-04-21 ENCOUNTER — Non-Acute Institutional Stay (SKILLED_NURSING_FACILITY): Payer: Medicare PPO | Admitting: Nurse Practitioner

## 2022-04-21 DIAGNOSIS — E870 Hyperosmolality and hypernatremia: Secondary | ICD-10-CM

## 2022-04-21 DIAGNOSIS — F01C0 Vascular dementia, severe, without behavioral disturbance, psychotic disturbance, mood disturbance, and anxiety: Secondary | ICD-10-CM | POA: Diagnosis not present

## 2022-04-21 DIAGNOSIS — E039 Hypothyroidism, unspecified: Secondary | ICD-10-CM

## 2022-04-21 DIAGNOSIS — I10 Essential (primary) hypertension: Secondary | ICD-10-CM | POA: Diagnosis not present

## 2022-04-21 DIAGNOSIS — E1159 Type 2 diabetes mellitus with other circulatory complications: Secondary | ICD-10-CM | POA: Diagnosis not present

## 2022-04-21 DIAGNOSIS — R627 Adult failure to thrive: Secondary | ICD-10-CM | POA: Diagnosis not present

## 2022-04-21 NOTE — Assessment & Plan Note (Signed)
takes Levothyroxine, TSH 4.26 05/29/21

## 2022-04-21 NOTE — Progress Notes (Signed)
Location:  [facility] Room Number: NO//27/A Place of Service:  SNF (31) Provider:  Ozzy Bohlken X, NP  Patient Care Team: Zoila Ditullio X, NP as PCP - General (Internal Medicine) [name], MD as PCP - Cardiology (Cardiology) [name], MD as Consulting Physician (Cardiology)  Extended Emergency Contact Information Primary Emergency Contact: Munley,[name] Address: [address]          [place],  [address] [name] Phone: [phone] Mobile Phone: [phone] Relation: Spouse  Code Status:  DNR Goals of care: Advanced Directive information    04/21/2022    8:30 AM  Advanced Directives  Does Patient Have a Medical Advance Directive? Yes  Type of Advance Directive Out of facility DNR (pink MOST or yellow form)  Does patient want to make changes to medical advance directive? No - Patient declined  Pre-existing out of facility DNR order (yellow form or pink MOST form) Pink Most/Yellow Form available - Physician notified to receive inpatient order;Yellow form placed in chart (order not valid for inpatient use)     Chief Complaint  Patient presents with  . Medical Management of Chronic Issues    Patient is here for a follow up for chronic conditions   . Quality Metric Gaps    Patient is due for eye exam, urine ACR, and AWV Patient also due for 2nd shingrix, and annual flu vaccine    HPI:  Pt is a 81 y.o. male seen today for medical management of chronic diseases.     Adult Failure to Thrive, weight stabilized, palliative care.              T2DM Hgb a1c 8.3 11/05/21,  hs snack to avoid low CBG in am. On Tresiba,  Novolog coverage             Syncope, stopped Labetalol, no recurrence reported             HTN, takes Amlodipine, Lisinopril Bun/creat 16/0.9 11/05/21             Hypothyroidism, takes Levothyroxine, TSH 4.26 05/29/21             Dementia, off Donepezil, SNF [facility] for supportive care              Hypernatremia, Na 145 11/05/21  Past Medical History:  Diagnosis Date  . Aortic regurgitation    mild by echo 08/2019  . Carotid artery stenosis    1-39% bilateral stenosis by dopplers 08/2019  . Cataract   . Complication of anesthesia    "high blood sugar; he's been loopy last 36h since OR"- 2016  . Diabetes mellitus   . DKA, type 2 ([place])   . Hearing loss   . Hypercholesteremia   . Hypertension   . Insulin pump in place   . Macular degeneration   . MCI (mild cognitive impairment) with memory loss 12/12/2014  . OSA on CPAP 09/11/2014  . PONV (postoperative nausea and vomiting)   . Uses hearing aid    right ear, patient stated he lost left hearing aid    Past Surgical History:  Procedure Laterality Date  . CHOLECYSTECTOMY N/A 10/28/2015   Procedure: LAPAROSCOPIC CHOLECYSTECTOMY WITH   INTRAOPERATIVE CHOLANGIOGRAM;  Surgeon: Greer Pickerel, MD;  Location: [facility];  Service: General;  Laterality: N/A;  . SHOULDER ARTHROSCOPY Right 08/24/11   "frozen shoulder; RCR w/labrum tear; arthritis scraping; bone spurs"  . SHOULDER ARTHROSCOPY Left ~ 2006  .  TONSILLECTOMY  1949   "in childhood"    No Known Allergies  Outpatient Encounter Medications as of 04/21/2022  Medication Sig  . amLODipine (NORVASC) 10 MG tablet Take 10 mg by mouth daily.  Marland Kitchen aspirin EC 81 MG tablet Take 81 mg by mouth daily.  . insulin aspart (NOVOLOG) 100 UNIT/ML injection If Blood Sugar is 151 to 300, give 5 Units. If Blood Sugar is 301 to 450, give 10 Units. If Blood Sugar is greater than 450, call MD. Subcutaneous, Three Times A Day Before Meals  . levothyroxine (SYNTHROID) 25 MCG tablet Take 25 mcg by mouth daily before breakfast.  . lisinopril (ZESTRIL) 30 MG tablet Take 30 mg by mouth daily.  Marland Kitchen nystatin (MYCOSTATIN/NYSTOP) powder Apply 1 application topically 3 (three) times daily. Apply to right and left buttocks with incontinent care every shift  . nystatin-triamcinolone (MYCOLOG II) cream Apply 1 application   topically 2 (two) times daily. Apply to right/left buttocks and any redden area until healed  . TRESIBA FLEXTOUCH 100 UNIT/ML FlexTouch Pen Inject 18 Units into the skin in the morning.   No facility-administered encounter medications on file as of 04/21/2022.    Review of Systems  Unable to perform ROS: Dementia    Immunization History  Administered Date(s) Administered  . Influenza Split 05/09/2010, 03/02/2011, 03/21/2012, 04/06/2013, 03/26/2014, 04/03/2020  . Influenza, High Dose Seasonal PF 04/06/2013, 04/02/2016, 03/31/2017  . Influenza, Quadrivalent, Recombinant, Inj, Pf 03/14/2018, 03/06/2019, 05/15/2020  . Influenza,inj,Quad PF,6+ Mos 05/01/2015  . Influenza-Unspecified 04/09/2021  . Moderna Covid-19 Vaccine Bivalent Booster 60yr & up 11/07/2021  . Moderna SARS-COV2 Booster Vaccination 04/30/2020, 11/19/2020  . Moderna Sars-Covid-2 Vaccination 06/26/2019, 07/24/2019  . PFIZER(Purple Top)SARS-COV-2 Vaccination 04/09/2021  . PPension scheme manager189yr& up 03/11/2021  . Pneumococcal Conjugate-13 01/31/2014, 08/22/2015  . Pneumococcal Polysaccharide-23 05/21/2008, 05/23/2009  . Td 05/18/2007, 05/23/2009  . Tdap 03/03/2012, 09/07/2016  . Zoster Recombinat (Shingrix) 09/26/2021  . Zoster, Live 05/21/2008, 05/23/2009   Pertinent  Health Maintenance Due  Topic Date Due  . OPHTHALMOLOGY EXAM  Never done  . INFLUENZA VACCINE  01/20/2022  . COLONOSCOPY (Pts 45-4948yrnsurance coverage will need to be confirmed)  06/18/2022 (Originally 04/13/2019)  . HEMOGLOBIN A1C  05/08/2022  . FOOT EXAM  09/23/2022      08/28/2019    1:47 PM 07/20/2020    2:00 PM 07/21/2020    4:00 AM 07/21/2020   10:00 AM 04/21/2022    8:32 AM  Fall Risk  Falls in the past year?     1  Was there an injury with Fall?     0  Fall Risk Category Calculator     2  Fall Risk Category     Moderate  Patient Fall Risk Level High fall risk High fall risk High fall risk High fall risk High fall  risk  Patient at Risk for Falls Due to     Impaired balance/gait;Impaired mobility;Impaired vision  Fall risk Follow up     Falls evaluation completed   Functional Status Survey:    Vitals:   04/21/22 0855  BP: (!) 149/83  Pulse: 73  Resp: 20  Temp: 98 F (36.7 C)  SpO2: 93%  Weight: 164 lb 3.2 oz (74.5 kg)  Height: '5\' 5"'$  (1.651 m)   Body mass index is 27.32 kg/m. Physical Exam Vitals and nursing note reviewed.  Constitutional:      Appearance: Normal appearance.  HENT:     Head: Normocephalic and atraumatic.  Mouth/Throat:     Mouth: Mucous membranes are moist.  Eyes:     Extraocular Movements: Extraocular movements intact.     Conjunctiva/sclera: Conjunctivae normal.     Pupils: Pupils are equal, round, and reactive to light.  Cardiovascular:     Rate and Rhythm: Normal rate and regular rhythm.     Heart sounds: No murmur heard. Pulmonary:     Effort: Pulmonary effort is normal.     Breath sounds: No rales.  Abdominal:     General: Bowel sounds are normal.     Palpations: Abdomen is soft.  Musculoskeletal:     Cervical back: Normal range of motion and neck supple.     Right lower leg: No edema.     Left lower leg: No edema.  Skin:    General: Skin is warm and dry.     Comments: Dark red discoloration of R+L buttocks and sacrum. Scattered cherry angioma chest, abd, back, BLE R>L, angioma. From previous examination.    Neurological:     General: No focal deficit present.     Mental Status: He is alert.     Motor: No weakness.     Coordination: Coordination normal.     Gait: Gait abnormal.     Comments: Oriented to self.   Psychiatric:     Comments: Alert, smiled.     Labs reviewed: Recent Labs    05/29/21 0000 06/05/21 0000 11/05/21 0000  NA 148* 146 145  K 3.6 3.7 3.8  CL 112* 110* 110*  CO2 24* 30* 29*  BUN '17 14 16  '$ CREATININE 0.9 0.9 0.9  CALCIUM 8.9  --  8.5*   Recent Labs    05/29/21 0000 11/05/21 0000  AST 11* 12*  ALT 8* 9*   ALKPHOS 88 92  ALBUMIN 3.3* 3.3*   Recent Labs    05/29/21 0000 06/26/21 0000 11/05/21 0000  WBC 5.6 7.2 5.2  NEUTROABS 3,584.00 3,708.00 2,413.00  HGB 13.7 12.7* 13.7  HCT 40* 38* 40*  PLT 182 215 178   Lab Results  Component Value Date   TSH 4.26 05/29/2021   Lab Results  Component Value Date   HGBA1C 8.3 11/05/2021   No results found for: "CHOL", "HDL", "LDLCALC", "LDLDIRECT", "TRIG", "CHOLHDL"  Significant Diagnostic Results in last 30 days:  No results found.  Assessment/Plan There are no diagnoses linked to this encounter.   Family/ staff Communication: ***  Labs/tests ordered:  ***

## 2022-04-21 NOTE — Assessment & Plan Note (Signed)
Hgb a1c 8.3 11/05/21,  hs snack to avoid low CBG in am. On Tresiba,  Novolog coverage

## 2022-04-21 NOTE — Assessment & Plan Note (Signed)
off Donepezil, SNF [facility] for supportive care  

## 2022-04-21 NOTE — Assessment & Plan Note (Signed)
Blood pressure is controlled,  takes Amlodipine, Lisinopril Bun/creat 16/0.9 11/05/21

## 2022-04-21 NOTE — Assessment & Plan Note (Signed)
weight stabilized, palliative care.

## 2022-04-21 NOTE — Assessment & Plan Note (Signed)
Na 145 11/05/21

## 2022-05-21 ENCOUNTER — Encounter: Payer: Self-pay | Admitting: Nurse Practitioner

## 2022-05-21 ENCOUNTER — Non-Acute Institutional Stay (SKILLED_NURSING_FACILITY): Payer: Medicare PPO | Admitting: Nurse Practitioner

## 2022-05-21 DIAGNOSIS — I10 Essential (primary) hypertension: Secondary | ICD-10-CM | POA: Diagnosis not present

## 2022-05-21 DIAGNOSIS — E039 Hypothyroidism, unspecified: Secondary | ICD-10-CM

## 2022-05-21 DIAGNOSIS — R55 Syncope and collapse: Secondary | ICD-10-CM

## 2022-05-21 DIAGNOSIS — R627 Adult failure to thrive: Secondary | ICD-10-CM | POA: Diagnosis not present

## 2022-05-21 DIAGNOSIS — E1159 Type 2 diabetes mellitus with other circulatory complications: Secondary | ICD-10-CM | POA: Diagnosis not present

## 2022-05-21 DIAGNOSIS — F01C0 Vascular dementia, severe, without behavioral disturbance, psychotic disturbance, mood disturbance, and anxiety: Secondary | ICD-10-CM | POA: Diagnosis not present

## 2022-05-21 NOTE — Progress Notes (Unsigned)
Location:  [facility] Room Number: NO/27/A Place of Service:  SNF (31) Provider:  Sharvil Hoey X, NP  Patient Care Team: Kyomi Hector X, NP as PCP - General (Internal Medicine) [name], MD as PCP - Cardiology (Cardiology) [name], MD as Consulting Physician (Cardiology)  Extended Emergency Contact Information Primary Emergency Contact: Nuno,[name] Address: [address]          [place], [place] [address] [place] Phone: [phone] Mobile Phone: [phone] Relation: Spouse  Code Status:   Goals of care: Advanced Directive information    05/21/2022   10:34 AM  Advanced Directives  Does Patient Have a Medical Advance Directive? Yes  Type of Advance Directive Out of facility DNR (pink MOST or yellow form)  Does patient want to make changes to medical advance directive? No - Patient declined  Pre-existing out of facility DNR order (yellow form or pink MOST form) Pink Most/Yellow Form available - Physician notified to receive inpatient order;Yellow form placed in chart (order not valid for inpatient use)     Chief Complaint  Patient presents with   Medical Management of Chronic Issues    Patient is here for a follow up for chronic conditions    Quality Metric Gaps    Patient is due for AWV, discuss need for updated shingrix, A1C, and Urine ACR    HPI:  Pt is a 81 y.o. male seen today for medical management of chronic diseases.   Adult Failure to Thrive, weight stabilized, palliative care.              T2DM Hgb a1c 8.3 11/05/21,  hs snack to avoid low CBG in am. On Tresiba,  Novolog coverage             Syncope, stopped Labetalol, no recurrence reported             HTN, takes Amlodipine, Lisinopril Bun/creat 16/0.9 11/05/21             Hypothyroidism, takes Levothyroxine, TSH 4.26 05/29/21             Dementia, off Donepezil, SNF [facility] for supportive care             Hypernatremia, Na 145 11/05/21   Past Medical  History:  Diagnosis Date   Aortic regurgitation    mild by echo 08/2019   Carotid artery stenosis    1-39% bilateral stenosis by dopplers 08/2019   Cataract    Complication of anesthesia    "high blood sugar; he's been loopy last 36h since OR"- 2016   Diabetes mellitus    DKA, type 2 ([place])    Hearing loss    Hypercholesteremia    Hypertension    Insulin pump in place    Macular degeneration    MCI (mild cognitive impairment) with memory loss 12/12/2014   OSA on CPAP 09/11/2014   PONV (postoperative nausea and vomiting)    Uses hearing aid    right ear, patient stated he lost left hearing aid    Past Surgical History:  Procedure Laterality Date   CHOLECYSTECTOMY N/A 10/28/2015   Procedure: LAPAROSCOPIC CHOLECYSTECTOMY WITH   INTRAOPERATIVE CHOLANGIOGRAM;  Surgeon: Greer Pickerel, MD;  Location: [facility];  Service: General;  Laterality: N/A;   SHOULDER ARTHROSCOPY Right 08/24/11   "frozen shoulder; RCR w/labrum tear; arthritis scraping; bone spurs"   SHOULDER ARTHROSCOPY Left ~ 2006   TONSILLECTOMY  1949   "in childhood"  No Known Allergies  Outpatient Encounter Medications as of 05/21/2022  Medication Sig   amLODipine (NORVASC) 10 MG tablet Take 10 mg by mouth daily.   aspirin EC 81 MG tablet Take 81 mg by mouth daily.   insulin aspart (NOVOLOG) 100 UNIT/ML injection If Blood Sugar is 151 to 300, give 5 Units. If Blood Sugar is 301 to 450, give 10 Units. If Blood Sugar is greater than 450, call MD. Subcutaneous, Three Times A Day Before Meals   levothyroxine (SYNTHROID) 25 MCG tablet Take 25 mcg by mouth daily before breakfast.   lisinopril (ZESTRIL) 30 MG tablet Take 30 mg by mouth daily.   nystatin (MYCOSTATIN/NYSTOP) powder Apply 1 application topically 3 (three) times daily. Apply to right and left buttocks with incontinent care every shift   nystatin-triamcinolone (MYCOLOG II) cream Apply 1 application  topically 2 (two) times daily. Apply to right/left buttocks and any redden  area until healed   TRESIBA FLEXTOUCH 100 UNIT/ML FlexTouch Pen Inject 18 Units into the skin in the morning.   No facility-administered encounter medications on file as of 05/21/2022.    Review of Systems  Immunization History  Administered Date(s) Administered   Fluad Quad(high Dose 65+) 04/15/2022   Influenza Split 05/09/2010, 03/02/2011, 03/21/2012, 04/06/2013, 03/26/2014, 04/03/2020   Influenza, High Dose Seasonal PF 04/06/2013, 04/02/2016, 03/31/2017   Influenza, Quadrivalent, Recombinant, Inj, Pf 03/14/2018, 03/06/2019, 05/15/2020   Influenza,inj,Quad PF,6+ Mos 05/01/2015   Influenza-Unspecified 04/09/2021   Moderna Covid-19 Vaccine Bivalent Booster 54yr & up 11/07/2021, 04/23/2022   Moderna SARS-COV2 Booster Vaccination 04/30/2020, 11/19/2020   Moderna Sars-Covid-2 Vaccination 06/26/2019, 07/24/2019   PFIZER(Purple Top)SARS-COV-2 Vaccination 04/09/2021   Pfizer Covid-19 Vaccine Bivalent Booster 195yr& up 03/11/2021   Pneumococcal Conjugate-13 01/31/2014, 08/22/2015   Pneumococcal Polysaccharide-23 05/21/2008, 05/23/2009   Td 05/18/2007, 05/23/2009   Tdap 03/03/2012, 09/07/2016   Zoster Recombinat (Shingrix) 09/26/2021   Zoster, Live 05/21/2008, 05/23/2009   Pertinent  Health Maintenance Due  Topic Date Due   OPHTHALMOLOGY EXAM  Never done   HEMOGLOBIN A1C  05/08/2022   COLONOSCOPY (Pts 45-4988yrnsurance coverage will need to be confirmed)  06/18/2022 (Originally 04/13/2019)   FOOT EXAM  09/23/2022   INFLUENZA VACCINE  Completed      07/20/2020    2:00 PM 07/21/2020    4:00 AM 07/21/2020   10:00 AM 04/21/2022    8:32 AM 05/21/2022   10:35 AM  Fall Risk  Falls in the past year?    1 1  Was there an injury with Fall?    0 0  Fall Risk Category Calculator    2 1  Fall Risk Category    Moderate Low  Patient Fall Risk Level High fall risk High fall risk High fall risk High fall risk High fall risk  Patient at Risk for Falls Due to    Impaired balance/gait;Impaired  mobility;Impaired vision History of fall(s)  Fall risk Follow up    Falls evaluation completed Falls evaluation completed   Functional Status Survey:    Vitals:   05/21/22 1040  BP: (!) 148/80  Pulse: 80  Resp: 17  Temp: 97.9 F (36.6 C)  SpO2: 95%  Weight: 159 lb 4.8 oz (72.3 kg)  Height: '5\' 5"'$  (1.651 m)   Body mass index is 26.51 kg/m. Physical Exam  Labs reviewed: Recent Labs    05/29/21 0000 06/05/21 0000 11/05/21 0000  NA 148* 146 145  K 3.6 3.7 3.8  CL 112* 110* 110*  CO2 24* 30* 29*  BUN '17 14 16  '$ CREATININE 0.9 0.9 0.9  CALCIUM 8.9  --  8.5*   Recent Labs    05/29/21 0000 11/05/21 0000  AST 11* 12*  ALT 8* 9*  ALKPHOS 88 92  ALBUMIN 3.3* 3.3*   Recent Labs    05/29/21 0000 06/26/21 0000 11/05/21 0000  WBC 5.6 7.2 5.2  NEUTROABS 3,584.00 3,708.00 2,413.00  HGB 13.7 12.7* 13.7  HCT 40* 38* 40*  PLT 182 215 178   Lab Results  Component Value Date   TSH 4.26 05/29/2021   Lab Results  Component Value Date   HGBA1C 8.3 11/05/2021   No results found for: "CHOL", "HDL", "LDLCALC", "LDLDIRECT", "TRIG", "CHOLHDL"  Significant Diagnostic Results in last 30 days:  No results found.  Assessment/Plan No problem-specific Assessment & Plan notes found for this encounter.     Family/ staff Communication: plan of care reviewed with the patient and charge nurse.   Labs/tests ordered: none  Time spend 25 minutes.

## 2022-05-22 ENCOUNTER — Encounter: Payer: Self-pay | Admitting: Nurse Practitioner

## 2022-05-25 ENCOUNTER — Encounter: Payer: Self-pay | Admitting: Nurse Practitioner

## 2022-05-25 ENCOUNTER — Non-Acute Institutional Stay (SKILLED_NURSING_FACILITY): Payer: Medicare PPO | Admitting: Nurse Practitioner

## 2022-05-25 DIAGNOSIS — R051 Acute cough: Secondary | ICD-10-CM

## 2022-05-25 DIAGNOSIS — F01C0 Vascular dementia, severe, without behavioral disturbance, psychotic disturbance, mood disturbance, and anxiety: Secondary | ICD-10-CM | POA: Diagnosis not present

## 2022-05-25 DIAGNOSIS — I10 Essential (primary) hypertension: Secondary | ICD-10-CM

## 2022-05-25 DIAGNOSIS — E039 Hypothyroidism, unspecified: Secondary | ICD-10-CM | POA: Diagnosis not present

## 2022-05-25 DIAGNOSIS — E1159 Type 2 diabetes mellitus with other circulatory complications: Secondary | ICD-10-CM

## 2022-05-25 DIAGNOSIS — R627 Adult failure to thrive: Secondary | ICD-10-CM

## 2022-05-25 DIAGNOSIS — R059 Cough, unspecified: Secondary | ICD-10-CM | POA: Diagnosis not present

## 2022-05-25 NOTE — Assessment & Plan Note (Signed)
takes Levothyroxine, TSH 4.26 05/29/21, update TSH

## 2022-05-25 NOTE — Assessment & Plan Note (Signed)
off Donepezil, SNF [facility] for supportive care  

## 2022-05-25 NOTE — Assessment & Plan Note (Signed)
takes Levothyroxine, TSH 4.26 05/29/21

## 2022-05-25 NOTE — Assessment & Plan Note (Signed)
Blood pressure is controlled, takes Amlodipine, Lisinopril Bun/creat 16/0.9 11/05/21

## 2022-05-25 NOTE — Assessment & Plan Note (Signed)
stopped Labetalol, no recurrence reported

## 2022-05-25 NOTE — Progress Notes (Signed)
[facility] Room Number: 18 Place of Service:  SNF (31) Provider: Lennie Odor Lameeka Schleifer NP  Sharese Manrique X, NP  Patient Care Team: Jalei Shibley X, NP as PCP - General (Internal Medicine) [name], MD as PCP - Cardiology (Cardiology) [name], MD as Consulting Physician (Cardiology)  Extended Emergency Contact Information Primary Emergency Contact: Derrick [name] Address: [address]          [place], [place] [address] [place] Phone: [phone] Mobile Phone: [phone] Relation: Spouse  Code Status: DNR Goals of care: Advanced Directive information    05/21/2022   10:34 AM  Advanced Directives  Does Patient Have a Medical Advance Directive? Yes  Type of Advance Directive Out of facility DNR (pink MOST or yellow form)  Does patient want to make changes to medical advance directive? No - Patient declined  Pre-existing out of facility DNR order (yellow form or pink MOST form) Pink Most/Yellow Form available - Physician notified to receive inpatient order;Yellow form placed in chart (order not valid for inpatient use)     Chief Complaint  Patient presents with   Acute Visit    Elevated CBG, mild cough.    HPI:  Pt is a 81 y.o. male seen today for an acute visit for elevated CBGs, mild cough, no O2 desaturation, the patient is afebrile.  Adult Failure to Thrive, weight stabilized, palliative care.              T2DM Hgb a1c 8.3 11/05/21,  hs snack to avoid low CBG in am. On Tresiba 18 u qd,   Novolog coverage             Syncope, stopped Labetalol, no recurrence reported             HTN, takes Amlodipine, Lisinopril Bun/creat 16/0.9 11/05/21             Hypothyroidism, takes Levothyroxine, TSH 4.26 05/29/21             Dementia, off Donepezil, SNF [facility] for supportive care   Past Medical History:  Diagnosis Date   Aortic regurgitation    mild by echo 08/2019   Carotid artery stenosis    1-39% bilateral stenosis by dopplers 08/2019    Cataract    Complication of anesthesia    "high blood sugar; he's been loopy last 36h since OR"- 2016   Diabetes mellitus    DKA, type 2 ([name])    Hearing loss    Hypercholesteremia    Hypertension    Insulin pump in place    Macular degeneration    MCI (mild cognitive impairment) with memory loss 12/12/2014   OSA on CPAP 09/11/2014   PONV (postoperative nausea and vomiting)    Uses hearing aid    right ear, patient stated he lost left hearing aid    Past Surgical History:  Procedure Laterality Date   CHOLECYSTECTOMY N/A 10/28/2015   Procedure: LAPAROSCOPIC CHOLECYSTECTOMY WITH   INTRAOPERATIVE CHOLANGIOGRAM;  Surgeon: Greer Pickerel, MD;  Location: [facility];  Service: General;  Laterality: N/A;   SHOULDER ARTHROSCOPY Right 08/24/11   "frozen shoulder; RCR w/labrum tear; arthritis scraping; bone spurs"   SHOULDER ARTHROSCOPY Left ~ 2006   TONSILLECTOMY  1949   "in childhood"    No Known Allergies  Allergies as of 05/25/2022   No Known Allergies      Medication List        Accurate as of May 25, 2022 12:42 PM. If you have any questions, ask your nurse or doctor.          amLODipine 10 MG tablet Commonly known as: NORVASC Take 10 mg by mouth daily.   aspirin EC 81 MG tablet Take 81 mg by mouth daily.   insulin aspart 100 UNIT/ML injection Commonly known as: novoLOG If Blood Sugar is 151 to 300, give 5 Units. If Blood Sugar is 301 to 450, give 10 Units. If Blood Sugar is greater than 450, call MD. Subcutaneous, Three Times A Day Before Meals   levothyroxine 25 MCG tablet Commonly known as: SYNTHROID Take 25 mcg by mouth daily before breakfast.   lisinopril 30 MG tablet Commonly known as: ZESTRIL Take 30 mg by mouth daily.   nystatin powder Commonly known as: MYCOSTATIN/NYSTOP Apply 1 application topically 3 (three) times daily. Apply to right and left buttocks with incontinent care every shift   nystatin-triamcinolone cream Commonly known as: MYCOLOG  II Apply 1 application  topically 2 (two) times daily. Apply to right/left buttocks and any redden area until healed   Tresiba FlexTouch 100 UNIT/ML FlexTouch Pen Generic drug: insulin degludec Inject 18 Units into the skin in the morning.        Review of Systems  Unable to perform ROS: Dementia    Immunization History  Administered Date(s) Administered   Fluad Quad(high Dose 65+) 04/15/2022   Influenza Split 05/09/2010, 03/02/2011, 03/21/2012, 04/06/2013, 03/26/2014, 04/03/2020   Influenza, High Dose Seasonal PF 04/06/2013, 04/02/2016, 03/31/2017   Influenza, Quadrivalent, Recombinant, Inj, Pf 03/14/2018, 03/06/2019, 05/15/2020   Influenza,inj,Quad PF,6+ Mos 05/01/2015   Influenza-Unspecified 04/09/2021   Moderna Covid-19 Vaccine Bivalent Booster 81yr & up 11/07/2021, 04/23/2022   Moderna SARS-COV2 Booster Vaccination 04/30/2020, 11/19/2020   Moderna Sars-Covid-2 Vaccination 06/26/2019, 07/24/2019   PFIZER(Purple Top)SARS-COV-2 Vaccination 04/09/2021   Pfizer Covid-19 Vaccine Bivalent Booster 182yr& up 03/11/2021   Pneumococcal Conjugate-13 01/31/2014, 08/22/2015   Pneumococcal Polysaccharide-23 05/21/2008, 05/23/2009   Td 05/18/2007, 05/23/2009   Tdap 03/03/2012, 09/07/2016   Zoster Recombinat (Shingrix) 09/26/2021   Zoster, Live 05/21/2008, 05/23/2009   Pertinent  Health Maintenance Due  Topic Date Due   OPHTHALMOLOGY EXAM  Never done   HEMOGLOBIN A1C  05/08/2022   COLONOSCOPY (Pts 45-4984yrnsurance coverage will need to be confirmed)  06/18/2022 (Originally 04/13/2019)   FOOT EXAM  09/23/2022   INFLUENZA VACCINE  Completed      07/20/2020    2:00 PM 07/21/2020    4:00 AM 07/21/2020   10:00 AM 04/21/2022    8:32 AM 05/21/2022   10:35 AM  Fall Risk  Falls in the past year?    1 1  Was there an injury with Fall?    0 0  Fall Risk Category Calculator    2 1  Fall Risk Category    Moderate Low  Patient Fall Risk Level High fall risk High fall risk High fall risk  High fall risk High fall risk  Patient at Risk for Falls Due to    Impaired balance/gait;Impaired mobility;Impaired vision History of fall(s)  Fall risk Follow up    Falls evaluation completed Falls evaluation completed   Functional Status Survey:    Vitals:   05/25/22 1218  BP: (!) 140/82  Pulse: 72  Resp: 17  Temp: 97.8 F (36.6 C)  SpO2: 97%  Weight: 159 lb 14.4 oz (72.5 kg)   Body mass index is 26.61 kg/m. Physical Exam Vitals and nursing note reviewed.  Constitutional:  Comments: Appears tired  HENT:     Head: Normocephalic and atraumatic.     Mouth/Throat:     Mouth: Mucous membranes are dry.  Eyes:     Extraocular Movements: Extraocular movements intact.     Conjunctiva/sclera: Conjunctivae normal.     Pupils: Pupils are equal, round, and reactive to light.  Cardiovascular:     Rate and Rhythm: Normal rate and regular rhythm.     Heart sounds: No murmur heard. Pulmonary:     Effort: Pulmonary effort is normal.     Breath sounds: No wheezing, rhonchi or rales.     Comments: cough Abdominal:     General: Bowel sounds are normal.     Palpations: Abdomen is soft.     Tenderness: There is no abdominal tenderness.  Musculoskeletal:     Cervical back: Normal range of motion and neck supple.     Right lower leg: No edema.     Left lower leg: No edema.  Skin:    General: Skin is warm and dry.     Comments: Dark red discoloration of R+L buttocks and sacrum. Scattered cherry angioma chest, abd, back, BLE R>L, angioma. From previous examination.    Neurological:     General: No focal deficit present.     Mental Status: He is alert.     Motor: No weakness.     Coordination: Coordination normal.     Gait: Gait abnormal.     Comments: Oriented to self. No longer ambulatory  Psychiatric:     Comments: Alert, flat affect     Labs reviewed: Recent Labs    05/29/21 0000 06/05/21 0000 11/05/21 0000  NA 148* 146 145  K 3.6 3.7 3.8  CL 112* 110* 110*  CO2  24* 30* 29*  BUN _0 CREATININE 0.9 0.9 0.9  CALCIUM 8.9  --  8.5*   Recent Labs    05/29/21 0000 11/05/21 0000  AST 11* 12*  ALT 8* 9*  ALKPHOS 88 92  ALBUMIN 3.3* 3.3*   Recent Labs    05/29/21 0000 06/26/21 0000 11/05/21 0000  WBC 5.6 7.2 5.2  NEUTROABS 3,584.00 3,708.00 2,413.00  HGB 13.7 12.7* 13.7  HCT 40* 38* 40*  PLT 182 215 178   Lab Results  Component Value Date   TSH 4.26 05/29/2021   Lab Results  Component Value Date   HGBA1C 8.3 11/05/2021   No results found for: "CHOL", "HDL", "LDLCALC", "LDLDIRECT", "TRIG", "CHOLHDL"  Significant Diagnostic Results in last 30 days:  No results found.  Assessment/Plan: Cough If HPOA desires: CXR ap/lateral, CBC/diff, CMP/eGFR, Mucinex bid x3 days.   Type 2 diabetes mellitus with vascular disease (HCC) Hgb a1c 8.3 11/05/21,  hs snack to avoid low CBG in am. On Tresiba 18 u qd,   Novolog coverage, labs CXR to r/o acute process, update Hgb A1c   Adult failure to thrive weight stabilized, palliative care, continue supportive care  HTN (hypertension) Blood pressure is controlled, takes Amlodipine, Lisinopril Bun/creat 16/0.9 11/05/21      Hypothyroidism  takes Levothyroxine, TSH 4.26 05/29/21, update TSH  Vascular dementia ([place]) off Donepezil, SNF [facility] for supportive care    Family/ staff Communication: plan of care reviewed with the patient and charge nurse.   Labs/tests ordered:  CXR ap/lateral, CBC/diff, CMP/eGFR, Hgb A1c, TSH  Time spend 35 minutes.

## 2022-05-25 NOTE — Assessment & Plan Note (Signed)
If HPOA desires: CXR ap/lateral, CBC/diff, CMP/eGFR, Mucinex bid x3 days.  05/25/22 CXR no acute cardiopulmonary disease.  05/26/22 ST to eval

## 2022-05-25 NOTE — Assessment & Plan Note (Signed)
Hgb a1c 8.3 11/05/21,  hs snack to avoid low CBG in am. On Tresiba 18 u qd,   Novolog coverage, labs CXR to r/o acute process, update Hgb A1c

## 2022-05-25 NOTE — Assessment & Plan Note (Signed)
Hgb a1c 8.3 11/05/21,  hs snack to avoid low CBG in am. On Tresiba,  Novolog coverage

## 2022-05-25 NOTE — Assessment & Plan Note (Signed)
weight stabilized, palliative care, continue supportive care

## 2022-05-25 NOTE — Assessment & Plan Note (Signed)
weight stabilized, palliative care.

## 2022-05-26 DIAGNOSIS — D72829 Elevated white blood cell count, unspecified: Secondary | ICD-10-CM | POA: Diagnosis not present

## 2022-05-26 DIAGNOSIS — I10 Essential (primary) hypertension: Secondary | ICD-10-CM | POA: Diagnosis not present

## 2022-05-26 DIAGNOSIS — Z794 Long term (current) use of insulin: Secondary | ICD-10-CM | POA: Diagnosis not present

## 2022-05-26 DIAGNOSIS — E038 Other specified hypothyroidism: Secondary | ICD-10-CM | POA: Diagnosis not present

## 2022-05-26 DIAGNOSIS — E1122 Type 2 diabetes mellitus with diabetic chronic kidney disease: Secondary | ICD-10-CM | POA: Diagnosis not present

## 2022-05-26 LAB — BASIC METABOLIC PANEL
BUN: 16 (ref 4–21)
CO2: 30 — AB (ref 13–22)
Chloride: 110 — AB (ref 99–108)
Creatinine: 0.8 (ref 0.6–1.3)
Glucose: 175
Potassium: 3.7 mEq/L (ref 3.5–5.1)
Sodium: 147 (ref 137–147)

## 2022-05-26 LAB — CBC: RBC: 4.4 (ref 3.87–5.11)

## 2022-05-26 LAB — COMPREHENSIVE METABOLIC PANEL
Albumin: 3.6 (ref 3.5–5.0)
Calcium: 8.7 (ref 8.7–10.7)
Globulin: 3
eGFR: 89

## 2022-05-26 LAB — HEPATIC FUNCTION PANEL
ALT: 8 U/L — AB (ref 10–40)
AST: 12 — AB (ref 14–40)
Alkaline Phosphatase: 93 (ref 25–125)
Bilirubin, Total: 0.5

## 2022-05-26 LAB — CBC AND DIFFERENTIAL
HCT: 41 (ref 41–53)
Hemoglobin: 13.8 (ref 13.5–17.5)
Neutrophils Absolute: 3144
Platelets: 183 10*3/uL (ref 150–400)
WBC: 5.8

## 2022-05-26 LAB — TSH: TSH: 4.03 (ref 0.41–5.90)

## 2022-05-26 LAB — HEMOGLOBIN A1C: Hemoglobin A1C: 8.3

## 2022-05-27 ENCOUNTER — Encounter: Payer: Self-pay | Admitting: Adult Health

## 2022-05-27 ENCOUNTER — Non-Acute Institutional Stay (SKILLED_NURSING_FACILITY): Payer: Medicare PPO | Admitting: Adult Health

## 2022-05-27 DIAGNOSIS — E1159 Type 2 diabetes mellitus with other circulatory complications: Secondary | ICD-10-CM

## 2022-05-27 DIAGNOSIS — I10 Essential (primary) hypertension: Secondary | ICD-10-CM

## 2022-05-27 DIAGNOSIS — F01C0 Vascular dementia, severe, without behavioral disturbance, psychotic disturbance, mood disturbance, and anxiety: Secondary | ICD-10-CM | POA: Diagnosis not present

## 2022-05-27 DIAGNOSIS — R627 Adult failure to thrive: Secondary | ICD-10-CM

## 2022-05-27 DIAGNOSIS — E039 Hypothyroidism, unspecified: Secondary | ICD-10-CM

## 2022-05-27 NOTE — Progress Notes (Signed)
Location:  [facility] Room Number: NO/27/A Place of Service:  SNF (31) Provider:  [place]  Patient Care Team: Mast, Man X, NP as PCP - General (Internal Medicine) [name], MD as PCP - Cardiology (Cardiology) [name], MD as Consulting Physician (Cardiology)  Extended Emergency Contact Information Primary Emergency Contact: [name],[name] Address: [address]          [place], [place] [address] [name] of [place] Phone: [phone] Mobile Phone: [phone] Relation: Spouse  Code Status:  DNR Goals of care: Advanced Directive information    05/27/2022    4:39 PM  Advanced Directives  Does Patient Have a Medical Advance Directive? Yes  Type of Advance Directive Out of facility DNR (pink MOST or yellow form)  Does patient want to make changes to medical advance directive? No - Patient declined  Pre-existing out of facility DNR order (yellow form or pink MOST form) Pink Most/Yellow Form available - Physician notified to receive inpatient order;Yellow form placed in chart (order not valid for inpatient use)     Chief Complaint  Patient presents with   Acute Visit    Acute Visit with provider on site at [facility] for continued health decline    HPI:  Pt is a 81 y.o. male seen today for an acute visit for continued health decline. He has a PMH of dementia, hypertension, type 2 diabetes mellitus, hyperlipidemia, aortic regurgitation and carotid stenosis. He is a long-term care resident of [facility]. He has severe vascular dementia and now nonverbal. He is nonverbal when seen today.  BP 140/82, takes Lisinopril and Amlodipine  TSH 4.26, 05/30/21, takes Levothyroxine  hgbA1c 8.2, 05/26/22, takes Novolog and [place] insulin  Wt Readings from Last 3 Encounters:  05/27/22 159 lb 9 oz (72.4 kg)  05/25/22 159 lb 14.4 oz (72.5 kg)  05/21/22 159 lb 4.8 oz (72.3 kg)      Past  Medical History:  Diagnosis Date   Aortic regurgitation    mild by echo 08/2019   Carotid artery stenosis    1-39% bilateral stenosis by dopplers 08/2019   Cataract    Complication of anesthesia    "high blood sugar; he's been loopy last 36h since OR"- 2016   Diabetes mellitus    DKA, type 2 ([place])    Hearing loss    Hypercholesteremia    Hypertension    Insulin pump in place    Macular degeneration    MCI (mild cognitive impairment) with memory loss 12/12/2014   OSA on CPAP 09/11/2014   PONV (postoperative nausea and vomiting)    Uses hearing aid    right ear, patient stated he lost left hearing aid    Past Surgical History:  Procedure Laterality Date   CHOLECYSTECTOMY N/A 10/28/2015   Procedure: LAPAROSCOPIC CHOLECYSTECTOMY WITH   INTRAOPERATIVE CHOLANGIOGRAM;  Surgeon: Greer Pickerel, MD;  Location: [facility];  Service: General;  Laterality: N/A;   SHOULDER ARTHROSCOPY Right 08/24/11   "frozen shoulder; RCR w/labrum tear; arthritis scraping; bone spurs"   SHOULDER ARTHROSCOPY Left ~ 2006   TONSILLECTOMY  1949   "in childhood"    No Known Allergies  Outpatient Encounter Medications as of 05/27/2022  Medication Sig   amLODipine (NORVASC) 10 MG tablet Take 10 mg by mouth daily.   aspirin EC 81 MG tablet Take 81 mg by mouth daily.   insulin aspart (NOVOLOG) 100 UNIT/ML injection If Blood Sugar is 151 to  300, give 5 Units. If Blood Sugar is 301 to 450, give 10 Units. If Blood Sugar is greater than 450, call MD. Subcutaneous, Three Times A Day Before Meals   levothyroxine (SYNTHROID) 25 MCG tablet Take 25 mcg by mouth daily before breakfast.   lisinopril (ZESTRIL) 30 MG tablet Take 30 mg by mouth daily.   nystatin (MYCOSTATIN/NYSTOP) powder Apply 1 application topically 3 (three) times daily. Apply to right and left buttocks with incontinent care every shift   nystatin-triamcinolone (MYCOLOG II) cream Apply 1 application  topically 2 (two) times daily. Apply to right/left buttocks and any  redden area until healed   TRESIBA FLEXTOUCH 100 UNIT/ML FlexTouch Pen Inject 18 Units into the skin in the morning.   No facility-administered encounter medications on file as of 05/27/2022.   Review of Systems:  Unable to obtain due to dementia.   Immunization History  Administered Date(s) Administered   Fluad Quad(high Dose 65+) 04/15/2022   Influenza Split 05/09/2010, 03/02/2011, 03/21/2012, 04/06/2013, 03/26/2014, 04/03/2020   Influenza, High Dose Seasonal PF 04/06/2013, 04/02/2016, 03/31/2017   Influenza, Quadrivalent, Recombinant, Inj, Pf 03/14/2018, 03/06/2019, 05/15/2020   Influenza,inj,Quad PF,6+ Mos 05/01/2015   Influenza-Unspecified 04/09/2021   Moderna Covid-19 Vaccine Bivalent Booster 30yr & up 11/07/2021, 04/23/2022   Moderna SARS-COV2 Booster Vaccination 04/30/2020, 11/19/2020   Moderna Sars-Covid-2 Vaccination 06/26/2019, 07/24/2019   PFIZER(Purple Top)SARS-COV-2 Vaccination 04/09/2021   Pfizer Covid-19 Vaccine Bivalent Booster 134yr& up 03/11/2021   Pneumococcal Conjugate-13 01/31/2014, 08/22/2015   Pneumococcal Polysaccharide-23 05/21/2008, 05/23/2009   Td 05/18/2007, 05/23/2009   Tdap 03/03/2012, 09/07/2016   Zoster Recombinat (Shingrix) 09/26/2021   Zoster, Live 05/21/2008, 05/23/2009   Pertinent  Health Maintenance Due  Topic Date Due   OPHTHALMOLOGY EXAM  Never done   COLONOSCOPY (Pts 45-496yrnsurance coverage will need to be confirmed)  06/18/2022 (Originally 04/13/2019)   FOOT EXAM  09/23/2022   HEMOGLOBIN A1C  11/25/2022   INFLUENZA VACCINE  Completed      07/21/2020    4:00 AM 07/21/2020   10:00 AM 04/21/2022    8:32 AM 05/21/2022   10:35 AM 05/27/2022    4:36 PM  Fall Risk  Falls in the past year?   '1 1 1  '$ Was there an injury with Fall?   0 0 0  Fall Risk Category Calculator   '2 1 1  '$ Fall Risk Category   Moderate Low Low  Patient Fall Risk Level High fall risk High fall risk High fall risk High fall risk High fall risk  Patient at Risk for  Falls Due to   Impaired balance/gait;Impaired mobility;Impaired vision History of fall(s) History of fall(s)  Fall risk Follow up   Falls evaluation completed Falls evaluation completed Falls evaluation completed   Functional Status Survey:    Vitals:   05/27/22 1633  BP: (!) 140/82  Pulse: 72  Resp: 17  Temp: 97.8 F (36.6 C)  SpO2: 97%  Weight: 159 lb 9 oz (72.4 kg)  Height: '5\' 5"'$  (1.651 m)   Body mass index is 26.55 kg/m. Physical Exam Constitutional:      General: He is not in acute distress. HENT:     Head: Normocephalic and atraumatic.     Mouth/Throat:     Mouth: Mucous membranes are moist.  Eyes:     Conjunctiva/sclera: Conjunctivae normal.  Cardiovascular:     Rate and Rhythm: Normal rate and regular rhythm.     Pulses: Normal pulses.     Heart sounds: Normal heart sounds.  Pulmonary:     Effort: Pulmonary effort is normal.     Breath sounds: Normal breath sounds.  Abdominal:     General: Bowel sounds are normal.     Palpations: Abdomen is soft.  Musculoskeletal:        General: No swelling.     Cervical back: Normal range of motion.  Skin:    General: Skin is warm and dry.  Neurological:     Mental Status: Mental status is at baseline.     Comments: Non-verbal  Psychiatric:        Mood and Affect: Mood normal.        Behavior: Behavior normal.     Labs reviewed: Recent Labs    05/29/21 0000 06/05/21 0000 11/05/21 0000 05/26/22 0715  NA 148* 146 145 147  K 3.6 3.7 3.8 3.7  CL 112* 110* 110* 110*  CO2 24* 30* 29* 30*  BUN '17 14 16 16  '$ CREATININE 0.9 0.9 0.9 0.8  CALCIUM 8.9  --  8.5* 8.7   Recent Labs    05/29/21 0000 11/05/21 0000 05/26/22 0715  AST 11* 12* 12*  ALT 8* 9* 8*  ALKPHOS 88 92 93  ALBUMIN 3.3* 3.3* 3.6   Recent Labs    06/26/21 0000 11/05/21 0000 05/26/22 0715  WBC 7.2 5.2 5.8  NEUTROABS 3,708.00 2,413.00 3,144.00  HGB 12.7* 13.7 13.8  HCT 38* 40* 41  PLT 215 178 183   Lab Results  Component Value Date    TSH 4.03 05/26/2022   Lab Results  Component Value Date   HGBA1C 8.3 05/26/2022   No results found for: "CHOL", "HDL", "LDLCALC", "LDLDIRECT", "TRIG", "CHOLHDL"  Significant Diagnostic Results in last 30 days:  No results found.  Assessment/Plan  1. Adult failure to thrive Body mass index is 26.55 kg/m. Wt Readings from Last 3 Encounters:  05/27/22 159 lb 9 oz (72.4 kg)  05/25/22 159 lb 14.4 oz (72.5 kg)  05/21/22 159 lb 4.8 oz (72.3 kg)   -  currently on palliative care -  daughter has requested for hospice consult  2. Severe vascular dementia without behavioral disturbance, psychotic disturbance, mood disturbance, or anxiety (HCC) -  non-verbal -   continue comfort care -  referred for hospice care  3. Type 2 diabetes mellitus with vascular disease [facility]) Lab Results  Component Value Date   HGBA1C 8.3 05/26/2022   -  continue Tresiba and Novolog SSI TID -  monitor CBGs  4. Hypothyroidism, unspecified type Lab Results  Component Value Date   TSH 4.03 05/26/2022   -  continue Levothyroxine  5. Primary hypertension -  BP 140/82 -  continue Amlodipine and Lisinopril     Family/ staff Communication:  Discussed plan of care with charge nurse.  Labs/tests ordered:  None

## 2022-07-14 ENCOUNTER — Encounter: Payer: Self-pay | Admitting: Family Medicine

## 2022-07-14 ENCOUNTER — Non-Acute Institutional Stay (SKILLED_NURSING_FACILITY): Admitting: Family Medicine

## 2022-07-14 DIAGNOSIS — R269 Unspecified abnormalities of gait and mobility: Secondary | ICD-10-CM | POA: Diagnosis not present

## 2022-07-14 DIAGNOSIS — E1159 Type 2 diabetes mellitus with other circulatory complications: Secondary | ICD-10-CM | POA: Diagnosis not present

## 2022-07-14 DIAGNOSIS — R627 Adult failure to thrive: Secondary | ICD-10-CM

## 2022-07-14 DIAGNOSIS — F01C0 Vascular dementia, severe, without behavioral disturbance, psychotic disturbance, mood disturbance, and anxiety: Secondary | ICD-10-CM

## 2022-07-14 DIAGNOSIS — I10 Essential (primary) hypertension: Secondary | ICD-10-CM | POA: Diagnosis not present

## 2022-07-14 NOTE — Progress Notes (Signed)
Location:  [facility] Room Number: 27-A Place of Service:  SNF (31) Provider:  Su Ley  Mast, Man X, NP  Patient Care Team: Mast, Man X, NP as PCP - General (Internal Medicine) [name], MD as PCP - Cardiology (Cardiology) [name], MD as Consulting Physician (Cardiology)  Extended Emergency Contact Information Primary Emergency Contact: Sweeney,[name] Address: [address]          [place], [place] [address] [name] of [place] Phone: [phone] Mobile Phone: [phone] Relation: Spouse  Code Status:  DNR Goals of care: Advanced Directive information    07/14/2022   11:02 AM  Advanced Directives  Does Patient Have a Medical Advance Directive? Yes  Type of Advance Directive Out of facility DNR (pink MOST or yellow form)  Does patient want to make changes to medical advance directive? No - Patient declined  Pre-existing out of facility DNR order (yellow form or pink MOST form) Pink MOST/Yellow Form most recent copy in chart - Physician notified to receive inpatient order     Chief Complaint  Patient presents with   Routine    HPI:  Pt is a 82 y.o. male seen today for medical management of chronic diseases.  He has advanced dementia and is total care.  He does not speak.  He is not able to feed himself He has type 2 diabetes with an A1c of 8.3.  He is given [place] along with NovoLog coverage. Continues to take amlodipine and lisinopril for blood hypertension and levothyroxine for hypothyroidism Nurses report no new problems.  Weight is stable.  Previously had been diagnosed with failure to thrive.   Past Medical History:  Diagnosis Date   Aortic regurgitation    mild by echo 08/2019   Carotid artery stenosis    1-39% bilateral stenosis by dopplers 08/2019   Cataract    Complication of anesthesia    "high blood sugar; he's been loopy last 36h since OR"- 2016   Diabetes mellitus    DKA, type 2 ([place])     Hearing loss    Hypercholesteremia    Hypertension    Insulin pump in place    Macular degeneration    MCI (mild cognitive impairment) with memory loss 12/12/2014   OSA on CPAP 09/11/2014   PONV (postoperative nausea and vomiting)    Uses hearing aid    right ear, patient stated he lost left hearing aid    Past Surgical History:  Procedure Laterality Date   CHOLECYSTECTOMY N/A 10/28/2015   Procedure: LAPAROSCOPIC CHOLECYSTECTOMY WITH   INTRAOPERATIVE CHOLANGIOGRAM;  Surgeon: Greer Pickerel, MD;  Location: [facility];  Service: General;  Laterality: N/A;   SHOULDER ARTHROSCOPY Right 08/24/11   "frozen shoulder; RCR w/labrum tear; arthritis scraping; bone spurs"   SHOULDER ARTHROSCOPY Left ~ 2006   TONSILLECTOMY  1949   "in childhood"    No Known Allergies  Outpatient Encounter Medications as of 07/14/2022  Medication Sig   amLODipine (NORVASC) 10 MG tablet Take 10 mg by mouth daily.   aspirin EC 81 MG tablet Take 81 mg by mouth daily.   insulin aspart (NOVOLOG) 100 UNIT/ML injection If Blood Sugar is 151 to 300, give 5 Units. If Blood Sugar is 301 to 450, give 10 Units. If Blood Sugar is greater than 450, call MD. Subcutaneous, Three Times A Day Before Meals   Insulin Pen Needle (BD AUTOSHIELD DUO) 30G X 5 MM MISC by Does not apply route. Inject  1 unit subcutaneously three times a day for diabetic USE WITH INSULIN PENS   levothyroxine (SYNTHROID) 25 MCG tablet Take 25 mcg by mouth daily before breakfast.   lisinopril (ZESTRIL) 30 MG tablet Take 30 mg by mouth daily.   nystatin (MYCOSTATIN/NYSTOP) powder Apply 1 application topically 3 (three) times daily. Apply to right and left buttocks with incontinent care every shift   nystatin-triamcinolone (MYCOLOG II) cream Apply 1 application  topically 2 (two) times daily. Apply to right/left buttocks and any redden area until healed   TRESIBA FLEXTOUCH 100 UNIT/ML FlexTouch Pen Inject 18 Units into the skin in the morning.   No  facility-administered encounter medications on file as of 07/14/2022.    Review of Systems  Unable to perform ROS: Dementia  Constitutional: Negative.   Respiratory: Negative.    Cardiovascular: Negative.   Neurological: Negative.   Hematological: Negative.   All other systems reviewed and are negative.   Immunization History  Administered Date(s) Administered   Fluad Quad(high Dose 65+) 04/15/2022   Influenza Split 05/09/2010, 03/02/2011, 03/21/2012, 04/06/2013, 03/26/2014, 04/03/2020   Influenza, High Dose Seasonal PF 04/06/2013, 04/02/2016, 03/31/2017   Influenza, Quadrivalent, Recombinant, Inj, Pf 03/14/2018, 03/06/2019, 05/15/2020   Influenza,inj,Quad PF,6+ Mos 05/01/2015   Influenza-Unspecified 04/09/2021   Moderna Covid-19 Vaccine Bivalent Booster 4yr & up 11/07/2021, 04/23/2022   Moderna SARS-COV2 Booster Vaccination 04/30/2020, 11/19/2020   Moderna Sars-Covid-2 Vaccination 06/26/2019, 07/24/2019   PFIZER(Purple Top)SARS-COV-2 Vaccination 04/09/2021   Pfizer Covid-19 Vaccine Bivalent Booster 114yr& up 03/11/2021   Pneumococcal Conjugate-13 01/31/2014, 08/22/2015   Pneumococcal Polysaccharide-23 05/21/2008, 05/23/2009   Td 05/18/2007, 05/23/2009   Tdap 03/03/2012, 09/07/2016   Zoster Recombinat (Shingrix) 09/26/2021   Zoster, Live 05/21/2008, 05/23/2009   Pertinent  Health Maintenance Due  Topic Date Due   OPHTHALMOLOGY EXAM  Never done   COLONOSCOPY (Pts 45-493yrnsurance coverage will need to be confirmed)  04/13/2019   FOOT EXAM  09/23/2022   HEMOGLOBIN A1C  11/25/2022   INFLUENZA VACCINE  Completed      07/21/2020    4:00 AM 07/21/2020   10:00 AM 04/21/2022    8:32 AM 05/21/2022   10:35 AM 05/27/2022    4:36 PM  Fall Risk  Falls in the past year?   '1 1 1  '$ Was there an injury with Fall?   0 0 0  Fall Risk Category Calculator   '2 1 1  '$ Fall Risk Category (Retired)   Moderate Low Low  (RETIRED) Patient Fall Risk Level High fall risk High fall risk High fall  risk High fall risk High fall risk  Patient at Risk for Falls Due to   Impaired balance/gait;Impaired mobility;Impaired vision History of fall(s) History of fall(s)  Fall risk Follow up   Falls evaluation completed Falls evaluation completed Falls evaluation completed   Functional Status Survey:    Vitals:   07/14/22 1054  BP: (!) 140/74  Pulse: 73  Resp: 16  Temp: 98 F (36.7 C)  SpO2: 96%  Weight: 157 lb 11.2 oz (71.5 kg)  Height: '5\' 5"'$  (1.651 m)   Body mass index is 26.24 kg/m. Physical Exam Vitals and nursing note reviewed.  Constitutional:      Appearance: Normal appearance.     Comments: Patient does not respond to simple questions  HENT:     Head: Normocephalic.  Cardiovascular:     Rate and Rhythm: Normal rate and regular rhythm.  Pulmonary:     Effort: Pulmonary effort is normal.  Breath sounds: Normal breath sounds.  Musculoskeletal:     Right lower leg: No edema.     Left lower leg: No edema.  Skin:    General: Skin is dry.  Neurological:     General: No focal deficit present.     Mental Status: He is alert.     Labs reviewed: Recent Labs    11/05/21 0000 05/26/22 0715  NA 145 147  K 3.8 3.7  CL 110* 110*  CO2 29* 30*  BUN 16 16  CREATININE 0.9 0.8  CALCIUM 8.5* 8.7   Recent Labs    11/05/21 0000 05/26/22 0715  AST 12* 12*  ALT 9* 8*  ALKPHOS 92 93  ALBUMIN 3.3* 3.6   Recent Labs    11/05/21 0000 05/26/22 0715  WBC 5.2 5.8  NEUTROABS 2,413.00 3,144.00  HGB 13.7 13.8  HCT 40* 41  PLT 178 183   Lab Results  Component Value Date   TSH 4.03 05/26/2022   Lab Results  Component Value Date   HGBA1C 8.3 05/26/2022   No results found for: "CHOL", "HDL", "LDLCALC", "LDLDIRECT", "TRIG", "CHOLHDL"  Significant Diagnostic Results in last 30 days:  No results found.  Assessment/Plan 1. Adult failure to thrive Weight has stabilized no weight loss over the past several months  2. Gait abnormality Patient either is in bed or  wheelchair  3. Primary hypertension Pressure managed with amlodipine and less than Prill as noted above  4. Type 2 diabetes mellitus with vascular disease (HCC) A1c acceptable at 8.3 on basal insulin as well as as needed for hyperglycemia   5. Severe vascular dementia without behavioral disturbance, psychotic disturbance, mood disturbance, or anxiety (HCC) Not on any medications.  Care is 100% supportive    Family/ staff Communication:   Labs/tests ordered:    Lillette Boxer. [place]ra Heck, [place] [address] [place], [place] Office [phone]

## 2022-08-03 ENCOUNTER — Encounter: Payer: Self-pay | Admitting: Nurse Practitioner

## 2022-08-03 ENCOUNTER — Non-Acute Institutional Stay (SKILLED_NURSING_FACILITY): Payer: Medicare PPO | Admitting: Nurse Practitioner

## 2022-08-03 DIAGNOSIS — E1110 Type 2 diabetes mellitus with ketoacidosis without coma: Secondary | ICD-10-CM

## 2022-08-03 DIAGNOSIS — I10 Essential (primary) hypertension: Secondary | ICD-10-CM

## 2022-08-03 DIAGNOSIS — F0390 Unspecified dementia without behavioral disturbance: Secondary | ICD-10-CM | POA: Diagnosis not present

## 2022-08-03 DIAGNOSIS — E039 Hypothyroidism, unspecified: Secondary | ICD-10-CM

## 2022-08-03 DIAGNOSIS — R627 Adult failure to thrive: Secondary | ICD-10-CM | POA: Diagnosis not present

## 2022-08-03 NOTE — Assessment & Plan Note (Signed)
Hgb a1c 8.3 11/05/21=8.3 05/26/22,   hs snack to avoid low CBG in am. On Tresiba,  Novolog coverage

## 2022-08-03 NOTE — Assessment & Plan Note (Signed)
takes Levothyroxine, TSH 4.03 05/26/22

## 2022-08-03 NOTE — Assessment & Plan Note (Signed)
off Donepezil, [facility] for supportive care

## 2022-08-03 NOTE — Assessment & Plan Note (Signed)
Adult Failure to Thrive, gradual weight loss, under Hospice care. MOST comfort measures, No IVF/ABT or feeding tube.

## 2022-08-03 NOTE — Assessment & Plan Note (Signed)
Blood pressure is controlled, takes Amlodipine, Lisinopril Bun/creat 16/0.8 05/26/22

## 2022-08-03 NOTE — Progress Notes (Signed)
Location:  [facility] Room Number: NO/27/A Place of Service:  SNF (31) Provider:  Leelan Rajewski X, NP  Patient Care Team: Sarath Privott X, NP as PCP - General (Internal Medicine) [name], MD as PCP - Cardiology (Cardiology) [name], MD as Consulting Physician (Cardiology)  Extended Emergency Contact Information Primary Emergency Contact: Kellman,[name] Address: [address]          [place], [place] [address] [name] of [place] Phone: [phone] Mobile Phone: [phone] Relation: Spouse  Code Status:  DNR Goals of care: Advanced Directive information    08/03/2022    1:19 PM  Advanced Directives  Does Patient Have a Medical Advance Directive? Yes  Type of Advance Directive Out of facility DNR (pink MOST or yellow form)  Does patient want to make changes to medical advance directive? No - Patient declined  Pre-existing out of facility DNR order (yellow form or pink MOST form) Pink MOST/Yellow Form most recent copy in chart - Physician notified to receive inpatient order     Chief Complaint  Patient presents with   Medical Management of Chronic Issues    Patient is here for a follow up for chronic conditions     HPI:  Pt is a 82 y.o. male seen today for medical management of chronic diseases.    Adult Failure to Thrive, gradual weight loss, under Hospice care. MOST comfort measures, No IVF/ABT or feeding tube.              T2DM Hgb a1c 8.3 11/05/21=8.3 05/26/22,   hs snack to avoid low CBG in am. On Tresiba,  Novolog coverage             Syncope, stopped Labetalol, no recurrence reported             HTN, takes Amlodipine, Lisinopril Bun/creat 16/0.8 05/26/22   Hypothyroidism, takes Levothyroxine, TSH 4.03 05/26/22             Dementia, off Donepezil, SNF [facility] for supportive care   Past Medical History:  Diagnosis Date   Aortic regurgitation    mild by echo 08/2019   Carotid artery stenosis    1-39% bilateral stenosis by  dopplers 08/2019   Cataract    Complication of anesthesia    "high blood sugar; he's been loopy last 36h since OR"- 2016   Diabetes mellitus    DKA, type 2 ([place])    Hearing loss    Hypercholesteremia    Hypertension    Insulin pump in place    Macular degeneration    MCI (mild cognitive impairment) with memory loss 12/12/2014   OSA on CPAP 09/11/2014   PONV (postoperative nausea and vomiting)    Uses hearing aid    right ear, patient stated he lost left hearing aid    Past Surgical History:  Procedure Laterality Date   CHOLECYSTECTOMY N/A 10/28/2015   Procedure: LAPAROSCOPIC CHOLECYSTECTOMY WITH   INTRAOPERATIVE CHOLANGIOGRAM;  Surgeon: Greer Pickerel, MD;  Location: [facility];  Service: General;  Laterality: N/A;   SHOULDER ARTHROSCOPY Right 08/24/11   "frozen shoulder; RCR w/labrum tear; arthritis scraping; bone spurs"   SHOULDER ARTHROSCOPY Left ~ 2006   TONSILLECTOMY  1949   "in childhood"    No Known Allergies  Outpatient Encounter Medications as of 08/03/2022  Medication Sig   amLODipine (NORVASC) 10 MG tablet Take 10 mg by mouth daily.   aspirin EC 81 MG tablet Take 81 mg by mouth  daily.   insulin aspart (NOVOLOG) 100 UNIT/ML injection If Blood Sugar is 151 to 300, give 5 Units. If Blood Sugar is 301 to 450, give 10 Units. If Blood Sugar is greater than 450, call MD. Subcutaneous, Three Times A Day Before Meals   Insulin Pen Needle (BD AUTOSHIELD DUO) 30G X 5 MM MISC by Does not apply route. Inject 1 unit subcutaneously three times a day for diabetic USE WITH INSULIN PENS   levothyroxine (SYNTHROID) 25 MCG tablet Take 25 mcg by mouth daily before breakfast.   lisinopril (ZESTRIL) 30 MG tablet Take 30 mg by mouth daily.   nystatin (MYCOSTATIN/NYSTOP) powder Apply 1 application topically 3 (three) times daily. Apply to right and left buttocks with incontinent care every shift   nystatin-triamcinolone (MYCOLOG II) cream Apply 1 application  topically 2 (two) times daily. Apply to  right/left buttocks and any redden area until healed   TRESIBA FLEXTOUCH 100 UNIT/ML FlexTouch Pen Inject 18 Units into the skin in the morning.   No facility-administered encounter medications on file as of 08/03/2022.    Review of Systems  Unable to perform ROS: Dementia    Immunization History  Administered Date(s) Administered   Fluad Quad(high Dose 65+) 04/15/2022   Influenza Split 05/09/2010, 03/02/2011, 03/21/2012, 04/06/2013, 03/26/2014, 04/03/2020   Influenza, High Dose Seasonal PF 04/06/2013, 04/02/2016, 03/31/2017   Influenza, Quadrivalent, Recombinant, Inj, Pf 03/14/2018, 03/06/2019, 05/15/2020   Influenza,inj,Quad PF,6+ Mos 05/01/2015   Influenza-Unspecified 04/09/2021   Moderna Covid-19 Vaccine Bivalent Booster 56yr & up 11/07/2021, 04/23/2022   Moderna SARS-COV2 Booster Vaccination 04/30/2020, 11/19/2020   Moderna Sars-Covid-2 Vaccination 06/26/2019, 07/24/2019   PFIZER(Purple Top)SARS-COV-2 Vaccination 04/09/2021   Pfizer Covid-19 Vaccine Bivalent Booster 14yr& up 03/11/2021   Pneumococcal Conjugate-13 01/31/2014, 08/22/2015   Pneumococcal Polysaccharide-23 05/21/2008, 05/23/2009   Td 05/18/2007, 05/23/2009   Tdap 03/03/2012, 09/07/2016   Zoster Recombinat (Shingrix) 09/26/2021   Zoster, Live 05/21/2008, 05/23/2009   Pertinent  Health Maintenance Due  Topic Date Due   OPHTHALMOLOGY EXAM  Never done   COLONOSCOPY (Pts [id number] coverage will need to be confirmed)  04/13/2019   FOOT EXAM  09/23/2022   HEMOGLOBIN A1C  11/25/2022   INFLUENZA VACCINE  Completed      07/21/2020   10:00 AM 04/21/2022    8:32 AM 05/21/2022   10:35 AM 05/27/2022    4:36 PM 08/03/2022    1:19 PM  Fall Risk  Falls in the past year?  1 1 1 $ 0  Was there an injury with Fall?  0 0 0 0  Fall Risk Category Calculator  2 1 1 $ 0  Fall Risk Category (Retired)  Moderate Low Low   (RETIRED) Patient Fall Risk Level High fall risk High fall risk High fall risk High fall risk    Patient at Risk for Falls Due to  Impaired balance/gait;Impaired mobility;Impaired vision History of fall(s) History of fall(s) History of fall(s)  Fall risk Follow up  Falls evaluation completed Falls evaluation completed Falls evaluation completed Falls evaluation completed   Functional Status Survey:    Vitals:   08/03/22 1317  BP: (!) 154/71  Pulse: 65  Resp: 17  Temp: 98 F (36.7 C)  SpO2: 95%  Weight: 153 lb 9.6 oz (69.7 kg)  Height: 5' 5"$  (1.651 m)   Body mass index is 25.56 kg/m. Physical Exam Vitals and nursing note reviewed.  Constitutional:      Appearance: Normal appearance.  HENT:     Head: Normocephalic and atraumatic.  Mouth/Throat:     Mouth: Mucous membranes are dry.  Eyes:     Extraocular Movements: Extraocular movements intact.     Conjunctiva/sclera: Conjunctivae normal.     Pupils: Pupils are equal, round, and reactive to light.  Cardiovascular:     Rate and Rhythm: Normal rate and regular rhythm.     Heart sounds: No murmur heard. Pulmonary:     Effort: Pulmonary effort is normal.     Breath sounds: No rales.  Abdominal:     General: Bowel sounds are normal.     Palpations: Abdomen is soft.     Tenderness: There is no abdominal tenderness.  Musculoskeletal:     Cervical back: Normal range of motion and neck supple.     Right lower leg: No edema.     Left lower leg: No edema.  Skin:    General: Skin is warm and dry.     Comments: Dark red discoloration of R+L buttocks and sacrum. Scattered cherry angioma chest, abd, back, BLE R>L, angioma. From previous examination.    Neurological:     General: No focal deficit present.     Mental Status: He is alert.     Motor: No weakness.     Coordination: Coordination normal.     Gait: Gait abnormal.     Comments: Oriented to self. No longer ambulatory  Psychiatric:     Comments: Alert, flat affect     Labs reviewed: Recent Labs    11/05/21 0000 05/26/22 0715  NA 145 147  K 3.8 3.7  CL  110* 110*  CO2 29* 30*  BUN 16 16  CREATININE 0.9 0.8  CALCIUM 8.5* 8.7   Recent Labs    11/05/21 0000 05/26/22 0715  AST 12* 12*  ALT 9* 8*  ALKPHOS 92 93  ALBUMIN 3.3* 3.6   Recent Labs    11/05/21 0000 05/26/22 0715  WBC 5.2 5.8  NEUTROABS 2,413.00 3,144.00  HGB 13.7 13.8  HCT 40* 41  PLT 178 183   Lab Results  Component Value Date   TSH 4.03 05/26/2022   Lab Results  Component Value Date   HGBA1C 8.3 05/26/2022   No results found for: "CHOL", "HDL", "LDLCALC", "LDLDIRECT", "TRIG", "CHOLHDL"  Significant Diagnostic Results in last 30 days:  No results found.  Assessment/Plan HTN (hypertension) Blood pressure is controlled, takes Amlodipine, Lisinopril Bun/creat 16/0.8 05/26/22    Hypothyroidism takes Levothyroxine, TSH 4.03 05/26/22  Senile dementia ([place]) off Donepezil, SNF [facility] for supportive care  Adult failure to thrive Adult Failure to Thrive, gradual weight loss, under Hospice care. MOST comfort measures, No IVF/ABT or feeding tube.   Diabetic ketoacidosis without coma associated with type 2 diabetes mellitus ([place]) Hgb a1c 8.3 11/05/21=8.3 05/26/22,   hs snack to avoid low CBG in am. On Tresiba,  Novolog coverage     Family/ staff Communication: plan of care reviewed with the patient and charge nurse.   Labs/tests ordered:  none  Time spend 25 minutes.

## 2022-08-17 ENCOUNTER — Encounter: Payer: Self-pay | Admitting: Nurse Practitioner

## 2022-08-17 ENCOUNTER — Non-Acute Institutional Stay (SKILLED_NURSING_FACILITY): Payer: Medicare PPO | Admitting: Nurse Practitioner

## 2022-08-17 DIAGNOSIS — R627 Adult failure to thrive: Secondary | ICD-10-CM | POA: Diagnosis not present

## 2022-08-17 DIAGNOSIS — I10 Essential (primary) hypertension: Secondary | ICD-10-CM

## 2022-08-17 DIAGNOSIS — E1159 Type 2 diabetes mellitus with other circulatory complications: Secondary | ICD-10-CM

## 2022-08-17 DIAGNOSIS — K0530 Chronic periodontitis, unspecified: Secondary | ICD-10-CM

## 2022-08-17 DIAGNOSIS — F0390 Unspecified dementia without behavioral disturbance: Secondary | ICD-10-CM

## 2022-08-17 NOTE — Assessment & Plan Note (Signed)
off Donepezil, [facility] for supportive care

## 2022-08-17 NOTE — Assessment & Plan Note (Signed)
gradual weight loss, under Hospice care. MOST comfort measures, No IVF/ABT or feeding tube.

## 2022-08-17 NOTE — Assessment & Plan Note (Signed)
Left upper gum redness, tenderness, bleeding when touched especially #11+#12 teeth area, will f/u dentist, try Gly-Oxide sol, to cleanse the area after meals and hx.

## 2022-08-17 NOTE — Progress Notes (Signed)
Location:  [facility] Room Number: NO/27/A Place of Service:  SNF (31) Provider:  Bennett Vanscyoc X, NP  Patient Care Team: Derrick [name] X, NP as PCP - General (Internal Medicine) [name], MD as PCP - Cardiology (Cardiology) [name], MD as Consulting Physician (Cardiology)  Extended Emergency Contact Information Primary Emergency Contact: Swing,[name] Address: [address]          [name], [name] [address] [name] of [place] Phone: [phone] Mobile Phone: [phone] Relation: Spouse  Code Status:  DNR Goals of care: Advanced Directive information    08/03/2022    1:19 PM  Advanced Directives  Does Patient Have a Medical Advance Directive? Yes  Type of Advance Directive Out of facility DNR (pink MOST or yellow form)  Does patient want to make changes to medical advance directive? No - Patient declined  Pre-existing out of facility DNR order (yellow form or pink MOST form) Pink MOST/Yellow Form most recent copy in chart - Physician notified to receive inpatient order     Chief Complaint  Patient presents with   Acute Visit    Patient is being seen for bleeding gums    HPI:  Pt is a 82 y.o. male seen today for an acute visit for the left upper gum redness, tenderness, bleeding when touched especially #11+#12 teeth area.  Adult Failure to Thrive, gradual weight loss, under Hospice care. MOST comfort measures, No IVF/ABT or feeding tube.              T2DM Hgb a1c 8.3 11/05/21=8.3 05/26/22,   hs snack to avoid low CBG in am. On Tresiba,  Novolog coverage             Syncope, stopped Labetalol, no recurrence reported             HTN, takes Amlodipine, Lisinopril Bun/creat 16/0.8 05/26/22   Hypothyroidism, takes Levothyroxine, TSH 4.03 05/26/22             Dementia, off Donepezil, SNF [facility] for supportive care   Past Medical History:  Diagnosis Date   Aortic regurgitation    mild by echo 08/2019   Carotid artery stenosis     1-39% bilateral stenosis by dopplers 08/2019   Cataract    Complication of anesthesia    "high blood sugar; he's been loopy last 36h since OR"- 2016   Diabetes mellitus    DKA, type 2 ([place])    Hearing loss    Hypercholesteremia    Hypertension    Insulin pump in place    Macular degeneration    MCI (mild cognitive impairment) with memory loss 12/12/2014   OSA on CPAP 09/11/2014   PONV (postoperative nausea and vomiting)    Uses hearing aid    right ear, patient stated he lost left hearing aid    Past Surgical History:  Procedure Laterality Date   CHOLECYSTECTOMY N/A 10/28/2015   Procedure: LAPAROSCOPIC CHOLECYSTECTOMY WITH   INTRAOPERATIVE CHOLANGIOGRAM;  Surgeon: Greer Pickerel, MD;  Location: [facility];  Service: General;  Laterality: N/A;   SHOULDER ARTHROSCOPY Right 08/24/11   "frozen shoulder; RCR w/labrum tear; arthritis scraping; bone spurs"   SHOULDER ARTHROSCOPY Left ~ 2006   TONSILLECTOMY  1949   "in childhood"    No Known Allergies  Outpatient Encounter Medications as of 08/17/2022  Medication Sig   amLODipine (NORVASC) 10 MG tablet Take 10 mg by mouth daily.   aspirin EC 81 MG tablet Take 81  mg by mouth daily.   insulin aspart (NOVOLOG) 100 UNIT/ML injection If Blood Sugar is 151 to 300, give 5 Units. If Blood Sugar is 301 to 450, give 10 Units. If Blood Sugar is greater than 450, call MD. Subcutaneous, Three Times A Day Before Meals   Insulin Pen Needle (BD AUTOSHIELD DUO) 30G X 5 MM MISC by Does not apply route. Inject 1 unit subcutaneously three times a day for diabetic USE WITH INSULIN PENS   levothyroxine (SYNTHROID) 25 MCG tablet Take 25 mcg by mouth daily before breakfast.   lisinopril (ZESTRIL) 30 MG tablet Take 30 mg by mouth daily.   nystatin (MYCOSTATIN/NYSTOP) powder Apply 1 application topically 3 (three) times daily. Apply to right and left buttocks with incontinent care every shift   nystatin-triamcinolone (MYCOLOG II) cream Apply 1 application  topically 2  (two) times daily. Apply to right/left buttocks and any redden area until healed   TRESIBA FLEXTOUCH 100 UNIT/ML FlexTouch Pen Inject 18 Units into the skin in the morning.   No facility-administered encounter medications on file as of 08/17/2022.    Review of Systems  Unable to perform ROS: Dementia    Immunization History  Administered Date(s) Administered   Fluad Quad(high Dose 65+) 04/15/2022   Influenza Split 05/09/2010, 03/02/2011, 03/21/2012, 04/06/2013, 03/26/2014, 04/03/2020   Influenza, High Dose Seasonal PF 04/06/2013, 04/02/2016, 03/31/2017   Influenza, Quadrivalent, Recombinant, Inj, Pf 03/14/2018, 03/06/2019, 05/15/2020   Influenza,inj,Quad PF,6+ Mos 05/01/2015   Influenza-Unspecified 04/09/2021   Moderna Covid-19 Vaccine Bivalent Booster 72yr & up 11/07/2021, 04/23/2022   Moderna SARS-COV2 Booster Vaccination 04/30/2020, 11/19/2020   Moderna Sars-Covid-2 Vaccination 06/26/2019, 07/24/2019   PFIZER(Purple Top)SARS-COV-2 Vaccination 04/09/2021   Pfizer Covid-19 Vaccine Bivalent Booster 165yr& up 03/11/2021   Pneumococcal Conjugate-13 01/31/2014, 08/22/2015   Pneumococcal Polysaccharide-23 05/21/2008, 05/23/2009   Td 05/18/2007, 05/23/2009   Tdap 03/03/2012, 09/07/2016   Zoster Recombinat (Shingrix) 09/26/2021   Zoster, Live 05/21/2008, 05/23/2009   Pertinent  Health Maintenance Due  Topic Date Due   OPHTHALMOLOGY EXAM  Never done   COLONOSCOPY (Pts [id number] coverage will need to be confirmed)  04/13/2019   FOOT EXAM  09/23/2022   HEMOGLOBIN A1C  11/25/2022   INFLUENZA VACCINE  Completed      07/21/2020   10:00 AM 04/21/2022    8:32 AM 05/21/2022   10:35 AM 05/27/2022    4:36 PM 08/03/2022    1:19 PM  Fall Risk  Falls in the past year?  '1 1 1 '$ 0  Was there an injury with Fall?  0 0 0 0  Fall Risk Category Calculator  '2 1 1 '$ 0  Fall Risk Category (Retired)  Moderate Low Low   (RETIRED) Patient Fall Risk Level High fall risk High fall risk High fall  risk High fall risk   Patient at Risk for Falls Due to  Impaired balance/gait;Impaired mobility;Impaired vision History of fall(s) History of fall(s) History of fall(s)  Fall risk Follow up  Falls evaluation completed Falls evaluation completed Falls evaluation completed Falls evaluation completed   Functional Status Survey:    Vitals:   08/17/22 1059  BP: (!) 149/89  Pulse: 73  Resp: 17  Temp: 98 F (36.7 C)  SpO2: 95%  Weight: 153 lb 9.6 oz (69.7 kg)  Height: '5\' 5"'$  (1.651 m)   Body mass index is 25.56 kg/m. Physical Exam Vitals and nursing note reviewed.  Constitutional:      Appearance: Normal appearance.  HENT:     Head: Normocephalic  and atraumatic.     Mouth/Throat:     Mouth: Mucous membranes are moist.     Comments: the left upper gum redness, tenderness, bleeding when touched especially #11+#12 teeth area.   Eyes:     Extraocular Movements: Extraocular movements intact.     Conjunctiva/sclera: Conjunctivae normal.     Pupils: Pupils are equal, round, and reactive to light.  Cardiovascular:     Rate and Rhythm: Normal rate and regular rhythm.     Heart sounds: No murmur heard. Pulmonary:     Effort: Pulmonary effort is normal.     Breath sounds: No rales.  Abdominal:     General: Bowel sounds are normal.     Palpations: Abdomen is soft.     Tenderness: There is no abdominal tenderness.  Musculoskeletal:     Cervical back: Normal range of motion and neck supple.     Right lower leg: No edema.     Left lower leg: No edema.  Skin:    General: Skin is warm and dry.     Comments: Dark red discoloration of R+L buttocks and sacrum. Scattered cherry angioma chest, abd, back, BLE R>L, angioma. From previous examination.    Neurological:     General: No focal deficit present.     Mental Status: He is alert.     Motor: No weakness.     Coordination: Coordination normal.     Gait: Gait abnormal.     Comments: Oriented to self. No longer ambulatory  Psychiatric:      Comments: Alert, flat affect, followed simple direction: opened mouth when oral exam performed.      Labs reviewed: Recent Labs    11/05/21 0000 05/26/22 0715  NA 145 147  K 3.8 3.7  CL 110* 110*  CO2 29* 30*  BUN 16 16  CREATININE 0.9 0.8  CALCIUM 8.5* 8.7   Recent Labs    11/05/21 0000 05/26/22 0715  AST 12* 12*  ALT 9* 8*  ALKPHOS 92 93  ALBUMIN 3.3* 3.6   Recent Labs    11/05/21 0000 05/26/22 0715  WBC 5.2 5.8  NEUTROABS 2,413.00 3,144.00  HGB 13.7 13.8  HCT 40* 41  PLT 178 183   Lab Results  Component Value Date   TSH 4.03 05/26/2022   Lab Results  Component Value Date   HGBA1C 8.3 05/26/2022   No results found for: "CHOL", "HDL", "LDLCALC", "LDLDIRECT", "TRIG", "CHOLHDL"  Significant Diagnostic Results in last 30 days:  No results found.  Assessment/Plan Periodontitis Left upper gum redness, tenderness, bleeding when touched especially #11+#12 teeth area, will f/u dentist, try Gly-Oxide sol, to cleanse the area after meals and hx.   Adult failure to thrive gradual weight loss, under Hospice care. MOST comfort measures, No IVF/ABT or feeding tube.   Type 2 diabetes mellitus with vascular disease ([facility])  Hgb a1c 8.3 11/05/21=8.3 05/26/22,   hs snack to avoid low CBG in am. On Tresiba,  Novolog coverage  HTN (hypertension) takes Amlodipine, Lisinopril Bun/creat 16/0.8 05/26/22       Family/ staff Communication: plan of care reviewed with the patient and charge nurse.   Labs/tests ordered:  none  Time spend 35 minutes.

## 2022-08-17 NOTE — Assessment & Plan Note (Signed)
Hgb a1c 8.3 11/05/21=8.3 05/26/22,   hs snack to avoid low CBG in am. On Tresiba,  Novolog coverage

## 2022-08-17 NOTE — Assessment & Plan Note (Signed)
takes Amlodipine, Lisinopril Bun/creat 16/0.8 05/26/22

## 2022-08-25 ENCOUNTER — Encounter: Payer: Self-pay | Admitting: Nurse Practitioner

## 2022-08-25 ENCOUNTER — Non-Acute Institutional Stay (INDEPENDENT_AMBULATORY_CARE_PROVIDER_SITE_OTHER): Payer: Medicare PPO | Admitting: Nurse Practitioner

## 2022-08-25 DIAGNOSIS — E039 Hypothyroidism, unspecified: Secondary | ICD-10-CM

## 2022-08-25 DIAGNOSIS — I10 Essential (primary) hypertension: Secondary | ICD-10-CM

## 2022-08-25 DIAGNOSIS — F0390 Unspecified dementia without behavioral disturbance: Secondary | ICD-10-CM

## 2022-08-25 DIAGNOSIS — E1159 Type 2 diabetes mellitus with other circulatory complications: Secondary | ICD-10-CM | POA: Diagnosis not present

## 2022-08-25 DIAGNOSIS — R627 Adult failure to thrive: Secondary | ICD-10-CM | POA: Diagnosis not present

## 2022-08-25 NOTE — Assessment & Plan Note (Signed)
Hgb a1c 8.3 11/05/21=8.3 05/26/22,   hs snack to avoid low CBG in am. On Tresiba,  Novolog coverage, due urine ACR

## 2022-08-25 NOTE — Assessment & Plan Note (Signed)
Adult Failure to Thrive, gradual weight loss, under Hospice care. MOST comfort measures, No IVF/ABT or feeding tube.

## 2022-08-25 NOTE — Assessment & Plan Note (Signed)
takes Levothyroxine, TSH 4.03 05/26/22

## 2022-08-25 NOTE — Assessment & Plan Note (Signed)
off Donepezil, [facility] for supportive care

## 2022-08-25 NOTE — Assessment & Plan Note (Signed)
Blood pressure is controlled,  takes Amlodipine, Lisinopril Bun/creat 16/0.8 05/26/22

## 2022-08-25 NOTE — Progress Notes (Signed)
Location:  [place] Room Number: [place] of Service:  SNF (31) Provider:  Jaydian Santana X, NP  Patient Care Team: Nawaf Strange X, NP as PCP - General (Internal Medicine) [name], MD as PCP - Cardiology (Cardiology) [name], MD as Consulting Physician (Cardiology)  Extended Emergency Contact Information Primary Emergency Contact: Rotan,[name] Address: [address]          [place], [place] [address] [name] of [place] Phone: [phone] Mobile Phone: [phone] Relation: Spouse  Code Status: DNR Goals of care: Advanced Directive information    08/25/2022    8:53 AM  Advanced Directives  Does Patient Have a Medical Advance Directive? Yes  Type of Advance Directive Out of facility DNR (pink MOST or yellow form)  Does patient want to make changes to medical advance directive? No - Patient declined  Pre-existing out of facility DNR order (yellow form or pink MOST form) Pink MOST form placed in chart (order not valid for inpatient use);Yellow form placed in chart (order not valid for inpatient use)     Chief Complaint  Patient presents with  . Medical Management of Chronic Issues    Routine Visit  . Quality Metric Gaps    Needs to discuss for the following Eye exam, Diabetic kidney evaluation, Annual Medicare Wellness,Colonoscopy, and Shingrix & Covid vaccine.    HPI:  Pt is a 82 y.o. male seen today for medical management of chronic diseases.     Adult Failure to Thrive, gradual weight loss, under Hospice care. MOST comfort measures, No IVF/ABT or feeding tube.              T2DM Hgb a1c 8.3 11/05/21=8.3 05/26/22,   hs snack to avoid low CBG in am. On Tresiba,  Novolog coverage             Syncope, stopped Labetalol, no recurrence reported             HTN, takes Amlodipine, Lisinopril Bun/creat 16/0.8 05/26/22   Hypothyroidism, takes Levothyroxine, TSH 4.03 05/26/22             Dementia, off Donepezil, SNF [facility] for  supportive care  Past Medical History:  Diagnosis Date  . Aortic regurgitation    mild by echo 08/2019  . Carotid artery stenosis    1-39% bilateral stenosis by dopplers 08/2019  . Cataract   . Complication of anesthesia    "high blood sugar; he's been loopy last 36h since OR"- 2016  . Diabetes mellitus   . DKA, type 2 ([place])   . Hearing loss   . Hypercholesteremia   . Hypertension   . Insulin pump in place   . Macular degeneration   . MCI (mild cognitive impairment) with memory loss 12/12/2014  . OSA on CPAP 09/11/2014  . PONV (postoperative nausea and vomiting)   . Uses hearing aid    right ear, patient stated he lost left hearing aid    Past Surgical History:  Procedure Laterality Date  . CHOLECYSTECTOMY N/A 10/28/2015   Procedure: LAPAROSCOPIC CHOLECYSTECTOMY WITH   INTRAOPERATIVE CHOLANGIOGRAM;  Surgeon: Greer Pickerel, MD;  Location: [facility];  Service: General;  Laterality: N/A;  . SHOULDER ARTHROSCOPY Right 08/24/11   "frozen shoulder; RCR w/labrum tear; arthritis scraping; bone spurs"  . SHOULDER ARTHROSCOPY Left ~ 2006  . TONSILLECTOMY  1949   "in childhood"    No Known Allergies  Outpatient Encounter Medications as of 08/25/2022  Medication Sig  .  amLODipine (NORVASC) 10 MG tablet Take 10 mg by mouth daily.  Marland Kitchen aspirin EC 81 MG tablet Take 81 mg by mouth daily.  . insulin aspart (NOVOLOG) 100 UNIT/ML injection If Blood Sugar is 151 to 300, give 5 Units. If Blood Sugar is 301 to 450, give 10 Units. If Blood Sugar is greater than 450, call MD. Subcutaneous, Three Times A Day Before Meals  . Insulin Pen Needle (BD AUTOSHIELD DUO) 30G X 5 MM MISC by Does not apply route. Inject 1 unit subcutaneously three times a day for diabetic USE WITH INSULIN PENS  . levothyroxine (SYNTHROID) 25 MCG tablet Take 25 mcg by mouth daily before breakfast.  . lisinopril (ZESTRIL) 30 MG tablet Take 30 mg by mouth daily.  Marland Kitchen nystatin (MYCOSTATIN/NYSTOP) powder Apply 1 application topically 3 (three)  times daily. Apply to right and left buttocks with incontinent care every shift  . nystatin-triamcinolone (MYCOLOG II) cream Apply 1 application  topically 2 (two) times daily. Apply to right/left buttocks and any redden area until healed  . TRESIBA FLEXTOUCH 100 UNIT/ML FlexTouch Pen Inject 18 Units into the skin in the morning.   No facility-administered encounter medications on file as of 08/25/2022.    Review of Systems  Unable to perform ROS: Dementia    Immunization History  Administered Date(s) Administered  . Fluad Quad(high Dose 65+) 04/15/2022  . Influenza Split 05/09/2010, 03/02/2011, 03/21/2012, 04/06/2013, 03/26/2014, 04/03/2020  . Influenza, High Dose Seasonal PF 04/06/2013, 04/02/2016, 03/31/2017  . Influenza, Quadrivalent, Recombinant, Inj, Pf 03/14/2018, 03/06/2019, 05/15/2020  . Influenza,inj,Quad PF,6+ Mos 05/01/2015  . Influenza-Unspecified 04/09/2021  . Moderna Covid-19 Vaccine Bivalent Booster 49yr & up 11/07/2021, 04/23/2022  . Moderna SARS-COV2 Booster Vaccination 04/30/2020, 11/19/2020  . Moderna Sars-Covid-2 Vaccination 06/26/2019, 07/24/2019  . PFIZER(Purple Top)SARS-COV-2 Vaccination 04/09/2021  . PPension scheme manager127yr& up 03/11/2021  . Pneumococcal Conjugate-13 01/31/2014, 08/22/2015  . Pneumococcal Polysaccharide-23 05/21/2008, 05/23/2009  . RSV,unspecified 07/10/2022  . Td 05/18/2007, 05/23/2009  . Tdap 03/03/2012, 09/07/2016  . Zoster Recombinat (Shingrix) 09/26/2021  . Zoster, Live 05/21/2008, 05/23/2009   Pertinent  Health Maintenance Due  Topic Date Due  . FOOT EXAM  09/23/2022  . HEMOGLOBIN A1C  11/25/2022  . INFLUENZA VACCINE  Completed  . OPHTHALMOLOGY EXAM  Discontinued  . COLONOSCOPY (Pts [id number] coverage will need to be confirmed)  Discontinued      04/21/2022    8:32 AM 05/21/2022   10:35 AM 05/27/2022    4:36 PM 08/03/2022    1:19 PM 08/25/2022    8:52 AM  Fall Risk  Falls in the past year? '1 1  1 '$ 0 0  Was there an injury with Fall? 0 0 0 0 0  Fall Risk Category Calculator '2 1 1 '$ 0 0  Fall Risk Category (Retired) Moderate Low Low    (RETIRED) Patient Fall Risk Level High fall risk High fall risk High fall risk    Patient at Risk for Falls Due to Impaired balance/gait;Impaired mobility;Impaired vision History of fall(s) History of fall(s) History of fall(s) History of fall(s)  Fall risk Follow up Falls evaluation completed Falls evaluation completed Falls evaluation completed Falls evaluation completed Falls evaluation completed   Functional Status Survey:    Vitals:   08/25/22 0846  BP: 135/87  Pulse: 66  Resp: 16  Temp: 97.8 F (36.6 C)  SpO2: 96%  Weight: 156 lb 1 oz (70.8 kg)  Height: '5\' 5"'$  (1.651 m)   Body mass index is 25.97 kg/m.  Physical Exam Vitals and nursing note reviewed.  Constitutional:      Appearance: Normal appearance.  HENT:     Head: Normocephalic and atraumatic.     Mouth/Throat:     Mouth: Mucous membranes are moist.     Comments: Improved the left upper gum redness, tenderness, bleeding when touched especially #11+#12 teeth area.   Eyes:     Extraocular Movements: Extraocular movements intact.     Conjunctiva/sclera: Conjunctivae normal.     Pupils: Pupils are equal, round, and reactive to light.  Cardiovascular:     Rate and Rhythm: Normal rate and regular rhythm.     Heart sounds: No murmur heard. Pulmonary:     Effort: Pulmonary effort is normal.     Breath sounds: No rales.  Abdominal:     General: Bowel sounds are normal.     Palpations: Abdomen is soft.     Tenderness: There is no abdominal tenderness.  Musculoskeletal:     Cervical back: Normal range of motion and neck supple.     Right lower leg: No edema.     Left lower leg: No edema.  Skin:    General: Skin is warm and dry.     Comments: Dark red discoloration of R+L buttocks and sacrum. Scattered cherry angioma chest, abd, back, BLE R>L, angioma. From previous examination.     Neurological:     General: No focal deficit present.     Mental Status: He is alert.     Motor: No weakness.     Coordination: Coordination normal.     Gait: Gait abnormal.     Comments: Oriented to self. No longer ambulatory  Psychiatric:     Comments: Alert, flat affect, followed simple direction: opened mouth when oral exam performed.     Labs reviewed: Recent Labs    11/05/21 0000 05/26/22 0715  NA 145 147  K 3.8 3.7  CL 110* 110*  CO2 29* 30*  BUN 16 16  CREATININE 0.9 0.8  CALCIUM 8.5* 8.7   Recent Labs    11/05/21 0000 05/26/22 0715  AST 12* 12*  ALT 9* 8*  ALKPHOS 92 93  ALBUMIN 3.3* 3.6   Recent Labs    11/05/21 0000 05/26/22 0715  WBC 5.2 5.8  NEUTROABS 2,413.00 3,144.00  HGB 13.7 13.8  HCT 40* 41  PLT 178 183   Lab Results  Component Value Date   TSH 4.03 05/26/2022   Lab Results  Component Value Date   HGBA1C 8.3 05/26/2022   No results found for: "CHOL", "HDL", "LDLCALC", "LDLDIRECT", "TRIG", "CHOLHDL"  Significant Diagnostic Results in last 30 days:  No results found.  Assessment/Plan Type 2 diabetes mellitus with vascular disease (HCC)  Hgb a1c 8.3 11/05/21=8.3 05/26/22,   hs snack to avoid low CBG in am. On Tresiba,  Novolog coverage, due urine ACR  Adult failure to thrive Adult Failure to Thrive, gradual weight loss, under Hospice care. MOST comfort measures, No IVF/ABT or feeding tube.   HTN (hypertension) Blood pressure is controlled,  takes Amlodipine, Lisinopril Bun/creat 16/0.8 05/26/22    Hypothyroidism takes Levothyroxine, TSH 4.03 05/26/22  Senile dementia ([place])  off Donepezil, SNF [facility] for supportive care     Family/ staff Communication: plan of care reviewed with the patient and charge nurse.   Labs/tests ordered: urine ACR  Time spend 35 minutes.

## 2022-08-28 ENCOUNTER — Encounter: Payer: Self-pay | Admitting: Nurse Practitioner

## 2022-08-28 ENCOUNTER — Non-Acute Institutional Stay (INDEPENDENT_AMBULATORY_CARE_PROVIDER_SITE_OTHER): Payer: Medicare PPO | Admitting: Nurse Practitioner

## 2022-08-28 DIAGNOSIS — Z0000 Encounter for general adult medical examination without abnormal findings: Secondary | ICD-10-CM | POA: Diagnosis not present

## 2022-08-28 NOTE — Progress Notes (Signed)
Subjective:   Derrick [name] is a 82 y.o. male who presents for Medicare Annual/Subsequent preventive examination in SNF [facility].        Objective:    Today's Vitals   08/28/22 0828 08/28/22 1703  BP: 135/87   Pulse: 66   Resp: 16   Temp: 97.8 F (36.6 C)   SpO2: 96%   Weight: 156 lb 1 oz (70.8 kg)   Height: '5\' 5"'$  (1.651 m)   PainSc: 0-No pain 0-No pain   Body mass index is 25.97 kg/m.     08/28/2022    8:38 AM 08/25/2022    8:53 AM 08/03/2022    1:19 PM 07/14/2022   11:02 AM 05/27/2022    4:39 PM 05/21/2022   10:34 AM 04/21/2022    8:30 AM  Advanced Directives  Does Patient Have a Medical Advance Directive? Yes Yes Yes Yes Yes Yes Yes  Type of Advance Directive Out of facility DNR (pink MOST or yellow form) Out of facility DNR (pink MOST or yellow form) Out of facility DNR (pink MOST or yellow form) Out of facility DNR (pink MOST or yellow form) Out of facility DNR (pink MOST or yellow form) Out of facility DNR (pink MOST or yellow form) Out of facility DNR (pink MOST or yellow form)  Does patient want to make changes to medical advance directive? No - Patient declined No - Patient declined No - Patient declined No - Patient declined No - Patient declined No - Patient declined No - Patient declined  Pre-existing out of facility DNR order (yellow form or pink MOST form) Pink MOST form placed in chart (order not valid for inpatient use);Yellow form placed in chart (order not valid for inpatient use) Pink MOST form placed in chart (order not valid for inpatient use);Yellow form placed in chart (order not valid for inpatient use) Pink MOST/Yellow Form most recent copy in chart - Physician notified to receive inpatient order Pink MOST/Yellow Form most recent copy in chart - Physician notified to receive inpatient order Pink Most/Yellow Form available - Physician notified to receive inpatient order;Yellow form placed in chart (order not valid for inpatient use)  Pink Most/Yellow Form available - Physician notified to receive inpatient order;Yellow form placed in chart (order not valid for inpatient use) Pink Most/Yellow Form available - Physician notified to receive inpatient order;Yellow form placed in chart (order not valid for inpatient use)    Current Medications (verified) Outpatient Encounter Medications as of 08/28/2022  Medication Sig   amLODipine (NORVASC) 10 MG tablet Take 10 mg by mouth daily.   aspirin EC 81 MG tablet Take 81 mg by mouth daily.   insulin aspart (NOVOLOG) 100 UNIT/ML injection If Blood Sugar is 151 to 300, give 5 Units. If Blood Sugar is 301 to 450, give 10 Units. If Blood Sugar is greater than 450, call MD. Subcutaneous, Three Times A Day Before Meals   Insulin Pen Needle (BD AUTOSHIELD DUO) 30G X 5 MM MISC by Does not apply route. Inject 1 unit subcutaneously three times a day for diabetic USE WITH INSULIN PENS   levothyroxine (SYNTHROID) 25 MCG tablet Take 25 mcg by mouth daily before breakfast.   lisinopril (ZESTRIL) 30 MG tablet Take 30 mg by mouth daily.   nystatin (MYCOSTATIN/NYSTOP) powder Apply 1 application topically 3 (three) times daily. Apply to right and left buttocks with incontinent care every shift   nystatin-triamcinolone (MYCOLOG II) cream Apply 1 application  topically 2 (  two) times daily. Apply to right/left buttocks and any redden area until healed   TRESIBA FLEXTOUCH 100 UNIT/ML FlexTouch Pen Inject 18 Units into the skin in the morning.   No facility-administered encounter medications on file as of 08/28/2022.    Allergies (verified) Patient has no known allergies.   History: Past Medical History:  Diagnosis Date   Aortic regurgitation    mild by echo 08/2019   Carotid artery stenosis    1-39% bilateral stenosis by dopplers 08/2019   Cataract    Complication of anesthesia    "high blood sugar; he's been loopy last 36h since OR"- 2016   Diabetes mellitus    DKA, type 2 ([place])    Hearing loss     Hypercholesteremia    Hypertension    Insulin pump in place    Macular degeneration    MCI (mild cognitive impairment) with memory loss 12/12/2014   OSA on CPAP 09/11/2014   PONV (postoperative nausea and vomiting)    Uses hearing aid    right ear, patient stated he lost left hearing aid    Past Surgical History:  Procedure Laterality Date   CHOLECYSTECTOMY N/A 10/28/2015   Procedure: LAPAROSCOPIC CHOLECYSTECTOMY WITH   INTRAOPERATIVE CHOLANGIOGRAM;  Surgeon: Greer Pickerel, MD;  Location: [facility];  Service: General;  Laterality: N/A;   SHOULDER ARTHROSCOPY Right 08/24/11   "frozen shoulder; RCR w/labrum tear; arthritis scraping; bone spurs"   SHOULDER ARTHROSCOPY Left ~ 2006   TONSILLECTOMY  1949   "in childhood"   Family History  Problem Relation Age of Onset   Heart disease Father    High blood pressure Father    Diabetes Daughter    Colon cancer Neg Hx    Stomach cancer Neg Hx    Pancreatic cancer Neg Hx    Esophageal cancer Neg Hx    Social History   Socioeconomic History   Marital status: Married    Spouse name: [name]    Number of children: 2   Years of education: masters   Highest education level: Not on file  Occupational History    Comment: Retired.  Tobacco Use   Smoking status: Former    Packs/day: 0.25    Years: 15.00    Total pack years: 3.75    Types: Cigarettes    Quit date: 06/23/1983    Years since quitting: 39.2   Smokeless tobacco: Never  Vaping Use   Vaping Use: Never used  Substance and Sexual Activity   Alcohol use: No    Alcohol/week: 0.0 standard drinks of alcohol   Drug use: No   Sexual activity: Yes  Other Topics Concern   Not on file  Social History Narrative   Patient lives at home with his wife [name]).   Retired.   Education Master's degree.   Right handed.   Caffeine three cups of coffee daily.   Social Determinants of Health   Financial Resource Strain: Not on file  Food Insecurity: Not on file  Transportation Needs: Not on  file  Physical Activity: Not on file  Stress: Not on file  Social Connections: Not on file    Tobacco Counseling Counseling given: Not Answered   Clinical Intake:  Pre-visit preparation completed: Yes  Pain : No/denies pain Pain Score: 0-No pain     BMI - recorded: 25.97 Nutritional Status: BMI 25 -29 Overweight Nutritional Risks: None Diabetes: Yes CBG done?: Yes CBG resulted in Enter/ Edit results?: No Did pt. bring in CBG monitor from  home?: No  How often do you need to have someone help you when you read instructions, pamphlets, or other written materials from your doctor or pharmacy?: 5 - Always  Diabetic?yes  Interpreter Needed?: No  Information entered by :: Telisha Zawadzki Darlina Rumpf Ameila Weldon   Activities of Daily Living     No data to display          Patient Care Team: Genna Casimir X, NP as PCP - General (Internal Medicine) [name], MD as PCP - Cardiology (Cardiology) [name], MD as Consulting Physician (Cardiology)  Indicate any recent Medical Services you may have received from other than [facility] providers in the past year (date may be approximate).     Assessment:   This is a routine wellness examination for [place].  Hearing/Vision screen No results found.  Dietary issues and exercise activities discussed:     Goals Addressed             This Visit's Progress    Comfort Maintained       Evidence-based guidance:  Screen for presence of fatigue using a quantitative (1 to 10) or a word scale (mild, moderate, severe); include onset, pattern, duration, any change over time, alleviating and contributing factors including reversible   causes, and degree of interference with daily function.  Acknowledge and validate presence of fatigue and impact on self-care, activities, and relationships.  Promote fatigue management techniques that may include use of a fatigue diary to help determine peak energy levels, balancing activity with rest, healthy  diet, and managing anemia.  Promote sleep hygiene techniques that include cool room temperature, darkened bedroom, use of white noise, use of active (with mediation or relaxation) or passive listening to soft, soothing music, cognitive behavior therapy;   provide strategies to reduce itching from pruritus.  In presence of pruritus, encourage use of gentle soap and emollient, warm (not hot) shower or bath, careful application of ice/cool pack; discourage scratching.  Assess, acknowledge, validate effects of disease on relationship(s) including intimacy, sexuality.  Discuss pregnancy, family planning, contraception with women of childbearing age; consider referrals as appropriate to perinatology, gynecologist.   Notes:        Depression Screen    08/28/2022    5:08 PM 08/28/2022    8:38 AM 08/25/2022    8:52 AM  PHQ 2/9 Scores  PHQ - 2 Score  0 0  Exception Documentation Medical reason      Fall Risk    08/28/2022    5:07 PM 08/28/2022    8:38 AM 08/25/2022    8:52 AM 08/03/2022    1:19 PM 05/27/2022    4:36 PM  Fall Risk   Falls in the past year? 0 0 0 0 1  Number falls in past yr: 0 0 0 0 0  Injury with Fall? 0 0 0 0 0  Risk for fall due to :  History of fall(s) History of fall(s) History of fall(s) History of fall(s)  Follow up  Falls evaluation completed Falls evaluation completed Falls evaluation completed Falls evaluation completed    [place]:  Any stairs in or around the home? Yes  If so, are there any without handrails? No  Home free of loose throw rugs in walkways, pet beds, electrical cords, etc? Yes  Adequate lighting in your home to reduce risk of falls? Yes   ASSISTIVE DEVICES UTILIZED TO PREVENT FALLS:  Life alert? No  Use of a cane, walker or  w/c? Yes  Grab bars in the bathroom? Yes  Shower chair or bench in shower? Yes  Elevated toilet seat or a handicapped toilet? Yes   TIMED UP AND GO:  Was the test performed? No . No longer  ambulatory    Cognitive Function:        Immunizations Immunization History  Administered Date(s) Administered   Fluad Quad(high Dose 65+) 04/15/2022   Influenza Split 05/09/2010, 03/02/2011, 03/21/2012, 04/06/2013, 03/26/2014, 04/03/2020   Influenza, High Dose Seasonal PF 04/06/2013, 04/02/2016, 03/31/2017   Influenza, Quadrivalent, Recombinant, Inj, Pf 03/14/2018, 03/06/2019, 05/15/2020   Influenza,inj,Quad PF,6+ Mos 05/01/2015   Influenza-Unspecified 04/09/2021   Moderna Covid-19 Vaccine Bivalent Booster 59yr & up 11/07/2021, 04/23/2022   Moderna SARS-COV2 Booster Vaccination 04/30/2020, 11/19/2020   Moderna Sars-Covid-2 Vaccination 06/26/2019, 07/24/2019   PFIZER(Purple Top)SARS-COV-2 Vaccination 04/09/2021   Pfizer Covid-19 Vaccine Bivalent Booster 173yr& up 03/11/2021   Pneumococcal Conjugate-13 01/31/2014, 08/22/2015   Pneumococcal Polysaccharide-23 05/21/2008, 05/23/2009   RSV,unspecified 07/10/2022   Td 05/18/2007, 05/23/2009   Tdap 03/03/2012, 09/07/2016   Zoster Recombinat (Shingrix) 09/26/2021, 08/27/2022   Zoster, Live 05/21/2008, 05/23/2009    TDAP status: Up to date  Flu Vaccine status: Up to date  Pneumococcal vaccine status: Up to date  Covid-19 vaccine status: Information provided on how to obtain vaccines.   Qualifies for Shingles Vaccine? Yes   Zostavax completed Yes   Shingrix Completed?: Yes  Screening Tests Health Maintenance  Topic Date Due   Medicare Annual Wellness (AWV)  09/07/2017   COVID-19 Vaccine (6 - 2023-24 season) 06/18/2022   Diabetic kidney evaluation - Urine ACR  08/28/2022 (Originally 03/27/1959)   FOOT EXAM  09/23/2022   HEMOGLOBIN A1C  11/25/2022   Diabetic kidney evaluation - eGFR measurement  05/27/2023   DTaP/Tdap/Td (5 - Td or Tdap) 09/08/2026   Pneumonia Vaccine 6592Years old  Completed   INFLUENZA VACCINE  Completed   Zoster Vaccines- Shingrix  Completed   HPV VACCINES  Aged Out   OPHTHALMOLOGY EXAM  Discontinued    COLONOSCOPY (Pts [id number] coverage will need to be confirmed)  Discontinued   Hepatitis C Screening  Discontinued    Health Maintenance  Health Maintenance Due  Topic Date Due   Medicare Annual Wellness (AWV)  09/07/2017   COVID-19 Vaccine (6 - 2023-24 season) 06/18/2022    Colorectal cancer screening: No longer required.   Lung Cancer Screening: (Low Dose CT Chest recommended if Age 25-59-80ars, 30 pack-year currently smoking OR have quit w/in 15years.) does not qualify.    Hepatitis C Screening: does not qualify  Vision Screening: Recommended annual ophthalmology exams for early detection of glaucoma and other disorders of the eye. Is the patient up to date with their annual eye exam?  No  Who is the provider or what is the name of the office in which the patient attends annual eye exams? HPOA will provided if desires.  If pt is not established with a provider, would they like to be referred to a provider to establish care? No .   Dental Screening: Recommended annual dental exams for proper oral hygiene  Community Resource Referral / Chronic Care Management: CRR required this visit?  No   CCM required this visit?  No      Plan:     I have personally reviewed and noted the following in the patient's chart:   Medical and social history Use of alcohol, tobacco or illicit drugs  Current medications and supplements including opioid prescriptions. Patient  is not currently taking opioid prescriptions. Functional ability and status Nutritional status Physical activity Advanced directives List of other physicians Hospitalizations, surgeries, and ER visits in previous 12 months Vitals Screenings to include cognitive, depression, and falls Referrals and appointments  In addition, I have reviewed and discussed with patient certain preventive protocols, quality metrics, and best practice recommendations. A written personalized care plan for preventive services as  well as general preventive health recommendations were provided to patient.     Timara Loma X Azaiah Licciardi, NP   08/28/2022

## 2022-09-05 ENCOUNTER — Telehealth: Payer: Self-pay | Admitting: Adult Health

## 2022-09-05 NOTE — Telephone Encounter (Signed)
Nurse called to report CBG over 500. Not covered by current SSI. Highest threshold on SSI is 10 units. Ordered 14 units of Novolog with breakfast. [place] provider f/u

## 2022-09-16 ENCOUNTER — Telehealth: Payer: Self-pay

## 2022-09-16 NOTE — Telephone Encounter (Signed)
[place] called about patient's blood sugar being elevated to 530. I spoke with [name] and she said she had already discussed a plan for this and will recheck in the morning. There were still questions whether to give fast acting insulin or not. I informed that they may want to call and speak with [facility] directly. She verbalized her understanding and agreed.

## 2022-09-17 ENCOUNTER — Non-Acute Institutional Stay (SKILLED_NURSING_FACILITY): Payer: Medicare PPO | Admitting: Nurse Practitioner

## 2022-09-17 ENCOUNTER — Encounter: Payer: Self-pay | Admitting: Nurse Practitioner

## 2022-09-17 DIAGNOSIS — I10 Essential (primary) hypertension: Secondary | ICD-10-CM

## 2022-09-17 DIAGNOSIS — R627 Adult failure to thrive: Secondary | ICD-10-CM

## 2022-09-17 DIAGNOSIS — E1110 Type 2 diabetes mellitus with ketoacidosis without coma: Secondary | ICD-10-CM

## 2022-09-17 DIAGNOSIS — F0390 Unspecified dementia without behavioral disturbance: Secondary | ICD-10-CM

## 2022-09-17 DIAGNOSIS — E039 Hypothyroidism, unspecified: Secondary | ICD-10-CM

## 2022-09-17 DIAGNOSIS — L89302 Pressure ulcer of unspecified buttock, stage 2: Secondary | ICD-10-CM

## 2022-09-17 DIAGNOSIS — L89309 Pressure ulcer of unspecified buttock, unspecified stage: Secondary | ICD-10-CM | POA: Insufficient documentation

## 2022-09-17 NOTE — Assessment & Plan Note (Signed)
gradual weight loss, under Hospice care. MOST comfort measures, No IVF/ABT or feeding tube.  

## 2022-09-17 NOTE — Assessment & Plan Note (Signed)
off Donepezil, SNF [facility] for supportive care 

## 2022-09-17 NOTE — Assessment & Plan Note (Signed)
R+L buttock, stage 2, apply Hydrocolloid dressing, air flow mattress overlay, assist the patient with frequent repositioning.

## 2022-09-17 NOTE — Progress Notes (Signed)
Location:   [facility] Room Number: [id number] Place of Service:  SNF (31) Provider: Lennie Odor Madeeha Costantino NP  Areg Bialas X, NP  Patient Care Team: Adaleen Hulgan X, NP as PCP - General (Internal Medicine) [name], MD as PCP - Cardiology (Cardiology) [name], MD as Consulting Physician (Cardiology)  Extended Emergency Contact Information Primary Emergency Contact: Ganger,[name] Address: [address]          [place], [place] [address] [place] Phone: [phone] Mobile Phone: [phone] Relation: Spouse  Code Status: DNR Goals of care: Advanced Directive information    09/17/2022    1:10 PM  Advanced Directives  Does Patient Have a Medical Advance Directive? Yes  Type of Advance Directive Out of facility DNR (pink MOST or yellow form)  Does patient want to make changes to medical advance directive? No - Patient declined     Chief Complaint  Patient presents with   Acute Visit    Blood Sugar Elevated. BS 478    HPI:  Pt is a 82 y.o. male seen today for an acute visit for elevated blood sugar, in 400-500s ac meals. The patient has developed pressure ulcers, his goal of care is comfort measures, under Hospice service.    Adult Failure to Thrive, gradual weight loss, under Hospice care. MOST comfort measures, No IVF/ABT or feeding tube.  Pressure ulcer stage 2 buttocks, R+L buttock, stage 2, apply Hydrocolloid dressing, air flow mattress overlay, assist the patient with frequent repositioning.              T2DM Hgb a1c 8.3 11/05/21=8.3 05/26/22,  hs snack to avoid low CBG in am. On Tresiba,  Novolog coverage             Syncope, stopped Labetalol, no recurrence reported             HTN, takes Amlodipine, Lisinopril Bun/creat 16/0.8 05/26/22   Hypothyroidism, takes Levothyroxine, TSH 4.03 05/26/22             Dementia, off Donepezil, SNF [facility] for supportive care    Past Medical History:  Diagnosis Date   Aortic regurgitation    mild by  echo 08/2019   Carotid artery stenosis    1-39% bilateral stenosis by dopplers 08/2019   Cataract    Complication of anesthesia    "high blood sugar; he's been loopy last 36h since OR"- 2016   Diabetes mellitus    DKA, type 2 ([place])    Hearing loss    Hypercholesteremia    Hypertension    Insulin pump in place    Macular degeneration    MCI (mild cognitive impairment) with memory loss 12/12/2014   OSA on CPAP 09/11/2014   PONV (postoperative nausea and vomiting)    Uses hearing aid    right ear, patient stated he lost left hearing aid    Past Surgical History:  Procedure Laterality Date   CHOLECYSTECTOMY N/A 10/28/2015   Procedure: LAPAROSCOPIC CHOLECYSTECTOMY WITH   INTRAOPERATIVE CHOLANGIOGRAM;  Surgeon: Greer Pickerel, MD;  Location: [facility];  Service: General;  Laterality: N/A;   SHOULDER ARTHROSCOPY Right 08/24/11   "frozen shoulder; RCR w/labrum tear; arthritis scraping; bone spurs"   SHOULDER ARTHROSCOPY Left ~ 2006   TONSILLECTOMY  1949   "in childhood"    No Known Allergies  Allergies as of 09/17/2022   No Known Allergies      Medication List        Accurate  as of September 17, 2022  3:09 PM. If you have any questions, ask your nurse or doctor.          amLODipine 10 MG tablet Commonly known as: NORVASC Take 10 mg by mouth daily.   aspirin EC 81 MG tablet Take 81 mg by mouth daily.   BD AutoShield Duo 30G X 5 MM Misc Generic drug: Insulin Pen Needle by Does not apply route. Inject 1 unit subcutaneously three times a day for diabetic USE WITH INSULIN PENS   insulin aspart 100 UNIT/ML injection Commonly known as: novoLOG If Blood Sugar is 151 to 300, give 5 Units. If Blood Sugar is 301 to 450, give 10 Units. If Blood Sugar is greater than 450, call MD. Subcutaneous, Three Times A Day Before Meals   levothyroxine 25 MCG tablet Commonly known as: SYNTHROID Take 25 mcg by mouth daily before breakfast.   lisinopril 30 MG tablet Commonly known as: ZESTRIL Take  30 mg by mouth daily.   nystatin powder Commonly known as: MYCOSTATIN/NYSTOP Apply 1 application topically 3 (three) times daily. Apply to right and left buttocks with incontinent care every shift   nystatin-triamcinolone cream Commonly known as: MYCOLOG II Apply 1 application  topically 2 (two) times daily. Apply to right/left buttocks and any redden area until healed   Tresiba FlexTouch 100 UNIT/ML FlexTouch Pen Generic drug: insulin degludec Inject 22 Units into the skin in the morning.        Review of Systems  Unable to perform ROS: Dementia    Immunization History  Administered Date(s) Administered   Fluad Quad(high Dose 65+) 04/15/2022   Influenza Split 05/09/2010, 03/02/2011, 03/21/2012, 04/06/2013, 03/26/2014, 04/03/2020   Influenza, High Dose Seasonal PF 04/06/2013, 04/02/2016, 03/31/2017   Influenza, Quadrivalent, Recombinant, Inj, Pf 03/14/2018, 03/06/2019, 05/15/2020   Influenza,inj,Quad PF,6+ Mos 05/01/2015   Influenza-Unspecified 04/09/2021   Moderna Covid-19 Vaccine Bivalent Booster 39yrs & up 11/07/2021, 04/23/2022   Moderna SARS-COV2 Booster Vaccination 04/30/2020, 11/19/2020   Moderna Sars-Covid-2 Vaccination 06/26/2019, 07/24/2019   PFIZER(Purple Top)SARS-COV-2 Vaccination 04/09/2021   Pfizer Covid-19 Vaccine Bivalent Booster 38yrs & up 03/11/2021   Pneumococcal Conjugate-13 01/31/2014, 08/22/2015   Pneumococcal Polysaccharide-23 05/21/2008, 05/23/2009   RSV,unspecified 07/10/2022   Td 05/18/2007, 05/23/2009   Tdap 03/03/2012, 09/07/2016   Zoster Recombinat (Shingrix) 09/26/2021, 08/27/2022   Zoster, Live 05/21/2008, 05/23/2009   Pertinent  Health Maintenance Due  Topic Date Due   FOOT EXAM  09/23/2022   HEMOGLOBIN A1C  11/25/2022   INFLUENZA VACCINE  Completed   OPHTHALMOLOGY EXAM  Discontinued   COLONOSCOPY (Pts 45-35yrs Insurance coverage will need to be confirmed)  Discontinued      05/27/2022    4:36 PM 08/03/2022    1:19 PM 08/25/2022     8:52 AM 08/28/2022    8:38 AM 08/28/2022    5:07 PM  Fall Risk  Falls in the past year? 1 0 0 0 0  Was there an injury with Fall? 0 0 0 0 0  Fall Risk Category Calculator 1 0 0 0 0  Fall Risk Category (Retired) Low      (RETIRED) Patient Fall Risk Level High fall risk      Patient at Risk for Falls Due to History of fall(s) History of fall(s) History of fall(s) History of fall(s)   Fall risk Follow up Falls evaluation completed Falls evaluation completed Falls evaluation completed Falls evaluation completed    Functional Status Survey:    Vitals:   09/17/22 1303  BP: 133/72  Pulse: 95  Resp: 18  Temp: 98.9 F (37.2 C)  SpO2: 94%  Weight: 156 lb 1.6 oz (70.8 kg)  Height: 5\' 5"  (1.651 m)   Body mass index is 25.98 kg/m. Physical Exam Vitals and nursing note reviewed.  Constitutional:      Appearance: Normal appearance.  HENT:     Head: Normocephalic and atraumatic.     Mouth/Throat:     Mouth: Mucous membranes are moist.     Comments:   Eyes:     Extraocular Movements: Extraocular movements intact.     Conjunctiva/sclera: Conjunctivae normal.     Pupils: Pupils are equal, round, and reactive to light.  Cardiovascular:     Rate and Rhythm: Normal rate and regular rhythm.     Heart sounds: No murmur heard. Pulmonary:     Effort: Pulmonary effort is normal.     Breath sounds: No rales.  Abdominal:     General: Bowel sounds are normal.     Palpations: Abdomen is soft.     Tenderness: There is no abdominal tenderness.  Musculoskeletal:     Cervical back: Normal range of motion and neck supple.     Right lower leg: No edema.     Left lower leg: No edema.  Skin:    General: Skin is warm and dry.     Comments: Dark red discoloration of R+L buttocks and sacrum. Scattered cherry angioma chest, abd, back, BLE R>L, angioma. From previous examination. New open stage 2 pressure ulcer R+L buttocks, no s/s of infection or active bleeding.    Neurological:     General: No focal  deficit present.     Mental Status: He is alert.     Gait: Gait abnormal.     Deep Tendon Reflexes: Reflexes abnormal.     Comments: Oriented to self. No longer ambulatory  Psychiatric:     Comments: Alert, flat affect, non verbal.      Labs reviewed: Recent Labs    11/05/21 0000 05/26/22 0715  NA 145 147  K 3.8 3.7  CL 110* 110*  CO2 29* 30*  BUN 16 16  CREATININE 0.9 0.8  CALCIUM 8.5* 8.7   Recent Labs    11/05/21 0000 05/26/22 0715  AST 12* 12*  ALT 9* 8*  ALKPHOS 92 93  ALBUMIN 3.3* 3.6   Recent Labs    11/05/21 0000 05/26/22 0715  WBC 5.2 5.8  NEUTROABS 2,413.00 3,144.00  HGB 13.7 13.8  HCT 40* 41  PLT 178 183   Lab Results  Component Value Date   TSH 4.03 05/26/2022   Lab Results  Component Value Date   HGBA1C 8.3 05/26/2022   No results found for: "CHOL", "HDL", "LDLCALC", "LDLDIRECT", "TRIG", "CHOLHDL"  Significant Diagnostic Results in last 30 days:  No results found.  Assessment/Plan: Diabetic ketoacidosis without coma associated with type 2 diabetes mellitus (HCC) CBG 400-500s, increased  Tresiba to 22 u qd, will increase SSI to 201-250 6u, 251-300 9u, 301-350 12u, 351-400 15u, 401-350 18u. The patient has history of hypoglycemic episodes at night, eating pattern is not consistently.    Pressure ulcer, buttock R+L buttock, stage 2, apply Hydrocolloid dressing, air flow mattress overlay, assist the patient with frequent repositioning.   Adult failure to thrive gradual weight loss, under Hospice care. MOST comfort measures, No IVF/ABT or feeding tube.   HTN (hypertension) gradual weight loss, under Hospice care. MOST comfort measures, No IVF/ABT or feeding tube.   Hypothyroidism takes Levothyroxine, TSH 4.03  05/26/22  Senile dementia ([place]) off Donepezil, SNF [facility] for supportive care    Family/ staff Communication: plan of care reviewed with the patient and charge nurse.   Labs/tests ordered:  none  Time spend 35 minutes.

## 2022-09-17 NOTE — Assessment & Plan Note (Signed)
CBG 400-500s, increased  Tresiba to 22 u qd, will increase SSI to 201-250 6u, 251-300 9u, 301-350 12u, 351-400 15u, 401-350 18u. The patient has history of hypoglycemic episodes at night, eating pattern is not consistently.

## 2022-09-17 NOTE — Assessment & Plan Note (Signed)
takes Levothyroxine, TSH 4.03 05/26/22 

## 2022-09-21 DEATH — deceased
